# Patient Record
Sex: Male | Born: 1950 | Race: White | Hispanic: No | Marital: Married | Smoking: Former smoker
Health system: Southern US, Community
[De-identification: ages and names within clinical notes are randomized; demographics above are authoritative.]

## PROBLEM LIST (undated history)

## (undated) ENCOUNTER — Emergency Department (HOSPITAL_COMMUNITY): Admission: EM | Discharge status: Against Medical Advice | Payer: BC Managed Care – PPO

## (undated) DIAGNOSIS — I2510 Atherosclerotic heart disease of native coronary artery without angina pectoris: Secondary | ICD-10-CM

## (undated) DIAGNOSIS — Z8619 Personal history of other infectious and parasitic diseases: Secondary | ICD-10-CM

## (undated) DIAGNOSIS — K219 Gastro-esophageal reflux disease without esophagitis: Secondary | ICD-10-CM

## (undated) DIAGNOSIS — M542 Cervicalgia: Secondary | ICD-10-CM

## (undated) DIAGNOSIS — R251 Tremor, unspecified: Secondary | ICD-10-CM

## (undated) DIAGNOSIS — M1990 Unspecified osteoarthritis, unspecified site: Secondary | ICD-10-CM

## (undated) DIAGNOSIS — G5600 Carpal tunnel syndrome, unspecified upper limb: Secondary | ICD-10-CM

## (undated) DIAGNOSIS — H04123 Dry eye syndrome of bilateral lacrimal glands: Secondary | ICD-10-CM

## (undated) DIAGNOSIS — E785 Hyperlipidemia, unspecified: Secondary | ICD-10-CM

## (undated) DIAGNOSIS — R351 Nocturia: Secondary | ICD-10-CM

## (undated) HISTORY — DX: Tremor, unspecified: R25.1

## (undated) HISTORY — PX: COLONOSCOPY: SHX174

## (undated) HISTORY — DX: Carpal tunnel syndrome, unspecified upper limb: G56.00

## (undated) HISTORY — DX: Atherosclerotic heart disease of native coronary artery without angina pectoris: I25.10

## (undated) HISTORY — PX: OTHER SURGICAL HISTORY: SHX169

---

## 1996-09-03 HISTORY — PX: OTHER SURGICAL HISTORY: SHX169

## 2002-10-12 ENCOUNTER — Encounter (INDEPENDENT_AMBULATORY_CARE_PROVIDER_SITE_OTHER): Payer: Self-pay

## 2002-10-12 ENCOUNTER — Encounter: Payer: Self-pay | Admitting: General Surgery

## 2002-10-12 ENCOUNTER — Ambulatory Visit (HOSPITAL_COMMUNITY): Admission: RE | Admit: 2002-10-12 | Discharge: 2002-10-12 | Payer: Self-pay | Admitting: General Surgery

## 2004-06-12 ENCOUNTER — Ambulatory Visit (HOSPITAL_COMMUNITY): Admission: RE | Admit: 2004-06-12 | Discharge: 2004-06-12 | Payer: Self-pay | Admitting: Internal Medicine

## 2006-09-03 HISTORY — PX: OTHER SURGICAL HISTORY: SHX169

## 2009-04-21 ENCOUNTER — Ambulatory Visit (HOSPITAL_COMMUNITY): Admission: RE | Admit: 2009-04-21 | Discharge: 2009-04-21 | Payer: Self-pay | Admitting: Pulmonary Disease

## 2009-05-10 ENCOUNTER — Ambulatory Visit (HOSPITAL_COMMUNITY): Admission: RE | Admit: 2009-05-10 | Discharge: 2009-05-10 | Payer: Self-pay | Admitting: Pulmonary Disease

## 2009-06-02 ENCOUNTER — Encounter (INDEPENDENT_AMBULATORY_CARE_PROVIDER_SITE_OTHER): Payer: Self-pay | Admitting: *Deleted

## 2009-06-02 ENCOUNTER — Ambulatory Visit: Payer: Self-pay | Admitting: Cardiology

## 2009-06-02 DIAGNOSIS — Z8719 Personal history of other diseases of the digestive system: Secondary | ICD-10-CM | POA: Insufficient documentation

## 2009-06-02 DIAGNOSIS — G252 Other specified forms of tremor: Secondary | ICD-10-CM

## 2009-06-02 DIAGNOSIS — G250 Essential tremor: Secondary | ICD-10-CM | POA: Insufficient documentation

## 2009-06-02 DIAGNOSIS — F17200 Nicotine dependence, unspecified, uncomplicated: Secondary | ICD-10-CM | POA: Insufficient documentation

## 2009-06-02 DIAGNOSIS — M79609 Pain in unspecified limb: Secondary | ICD-10-CM | POA: Insufficient documentation

## 2009-06-15 ENCOUNTER — Encounter (INDEPENDENT_AMBULATORY_CARE_PROVIDER_SITE_OTHER): Payer: Self-pay | Admitting: *Deleted

## 2009-06-17 ENCOUNTER — Ambulatory Visit: Payer: Self-pay | Admitting: Cardiology

## 2009-06-17 ENCOUNTER — Encounter (INDEPENDENT_AMBULATORY_CARE_PROVIDER_SITE_OTHER): Payer: Self-pay | Admitting: *Deleted

## 2009-06-22 ENCOUNTER — Ambulatory Visit: Payer: Self-pay | Admitting: Cardiology

## 2009-06-22 ENCOUNTER — Encounter (HOSPITAL_COMMUNITY): Admission: RE | Admit: 2009-06-22 | Discharge: 2009-07-22 | Payer: Self-pay | Admitting: Cardiology

## 2009-06-24 ENCOUNTER — Telehealth (INDEPENDENT_AMBULATORY_CARE_PROVIDER_SITE_OTHER): Payer: Self-pay

## 2009-06-27 DIAGNOSIS — I10 Essential (primary) hypertension: Secondary | ICD-10-CM | POA: Insufficient documentation

## 2009-06-28 ENCOUNTER — Encounter: Payer: Self-pay | Admitting: Cardiology

## 2009-07-07 ENCOUNTER — Ambulatory Visit: Payer: Self-pay | Admitting: Cardiology

## 2009-07-07 DIAGNOSIS — I517 Cardiomegaly: Secondary | ICD-10-CM | POA: Insufficient documentation

## 2009-07-08 ENCOUNTER — Ambulatory Visit: Payer: Self-pay | Admitting: Cardiology

## 2009-07-08 ENCOUNTER — Encounter: Payer: Self-pay | Admitting: Cardiology

## 2010-07-06 ENCOUNTER — Encounter: Payer: Self-pay | Admitting: Internal Medicine

## 2010-07-17 ENCOUNTER — Ambulatory Visit (HOSPITAL_COMMUNITY): Admission: RE | Admit: 2010-07-17 | Discharge: 2010-07-17 | Payer: Self-pay | Admitting: Internal Medicine

## 2010-07-17 ENCOUNTER — Ambulatory Visit: Payer: Self-pay | Admitting: Internal Medicine

## 2010-08-11 ENCOUNTER — Encounter (INDEPENDENT_AMBULATORY_CARE_PROVIDER_SITE_OTHER): Payer: Self-pay | Admitting: *Deleted

## 2010-09-24 ENCOUNTER — Encounter: Payer: Self-pay | Admitting: Cardiology

## 2010-10-03 NOTE — Assessment & Plan Note (Signed)
 Summary: ROV   Visit Type:  Follow-up Primary Provider:  Dr.Edward Juanetta Gosling   History of Present Illness: Return visit for this 60 year old gentleman currently under evaluation for left arm discomfort.  A magnetic resonance imaging study reportedly revealed the presence of degenerative joint disease, which likely accounts for his symptoms.  He appreciated no benefit from naproxen and has discontinued that medication.  Pedal edema resolved after he did so.  He continues to be quite active with no cardiopulmonary symptoms.  He has tapered tobacco use, now smoking one bowl in his pipe per day rather than 7.  He was congratulated on this effort and advised to discontinue use of tobacco products entirely.   Current Medications (verified): 1)  Propranolol Hcl 10 Mg Tabs (Propranolol Hcl) .... Take Two Tabs Once Daily 2)  Glucosamine 500 Mg Caps (Glucosamine Sulfate) .... Take 1 Tab Daily 3)  Vitamin D 1000 Unit Caps (Cholecalciferol) .... Take 1 Tab Daily 4)  Vitamin C Cr 500 Mg Cr-Caps (Ascorbic Acid) .... Take 1 Tab Daily 5)  Vitamin E 400 Unit Caps (Vitamin E) .... Take 1 Cap Daily 6)  Vitamin B-12 500 Mcg Tabs (Cyanocobalamin) .... Take 1 Tab Daily  Allergies (verified): No Known Drug Allergies  Vital Signs:  Patient profile:   60 year old male Weight:      206 pounds Pulse rate:   44 / minute BP sitting:   140 / 80  (right arm)  Vitals Entered By: Dreama Saa, CNA (July 07, 2009 3:18 PM)  Physical Exam  General:   General-Well-developed; no acute distress: Lungs-No tachypnea, no rales,minimal expiratory rhonchi;no prolongation of expiratory phase CV-normal PMI; normal S1 and S2; grade 1--2/6 early systolic ejection murmur at the cardiac base; S4 present  Abdomen-BS normal; soft and non-tender without masses or organomegaly: Extremities-Nl distal pulses; no edema    Impression & Recommendations:  Problem # 1:  ARM PAIN, LEFT (ICD-729.5) I continue to doubt the  presence of any significant cardiac problems.  Unfortunately, each test performed leads to get another study.  His standard treadmill test was equivocal although exercise tolerance was excellent, and symptoms were not reproduced.  His stress nuclear study was interpreted as potentially showing ischemic heart disease; however, to my review, there is diaphragmatic attenuation, physiologic apical thinning and no definite perfusion abnormality.  There was, however, moderate left ventricular enlargement and mildly depressed left ventricular systolic function.  We will proceed with an echocardiogram to further investigate these findings, which hopefully represent artifact. His cardiac enlargement and sinus bradycardia could be explained by the extent of aerobic exercise and he performs; however, this would not depress ejection fraction.  I will not plan a return visit since I believe his echocardiogram will be normal or near normal.   Addendum:  Echocardiogram reviewed.  The left ventricle is dialated, but only mildly so.  LV systolic function is low normal.  No valvular abnormalities were noted.  I discussed these results with Mr. Gosline.  While this certainly could be the result of extensive aerobic conditioning over many years, there is no guarantee that this is the case.  I will continue to see him annually for the time being to verify the no cardiac pathology is present.     I will be happy to assist them with any future cardiology issues that arise.  Other Orders: 2-D Echocardiogram (2D Echo)  Patient Instructions: 1)  Your physician recommends that you schedule a follow-up appointment in: as needed 2)  Your  physician has requested that you have an echocardiogram.  Echocardiography is a painless test that uses sound waves to create images of your heart. It provides your doctor with information about the size and shape of your heart and how well your heart's chambers and valves are working.  This  procedure takes approximately one hour. There are no restrictions for this procedure.

## 2010-10-03 NOTE — Letter (Signed)
 Summary: Recall, Screening Colonoscopy Only  [facility]  [address]   [place], [place] [address]   Phone: [phone]  Fax: [phone]    June 15, 2009  Jeffery Goodwin [address] [place], [place]  [address] 1951-08-19   Dear [name],   Our records indicate it is time to schedule your colonoscopy.  However, we need you  to contact us by phone.   Please call our office at [phone] and ask for the nurse.   Thank you,    Hendricks Limes, LPN Cloria Spring, LPN  [facility] Ph: [phone]   Fax: [phone]

## 2010-10-03 NOTE — Miscellaneous (Signed)
 Summary: Echo  Clinical Lists Changes  Observations: Added new observation of ECHOINTERP: Study Conclusions    1. Stress ECG conclusions: There were no stress arrhythmias or      conduction abnormalities; tracing quality was suboptimal due to      artifact. The stress ECG revealed general insignificant uploping      ST segment depression, most notable in the first minute of      recovery and was negative for ischemia.   2. Staged echo: There was no echocardiographic evidence for      stress-induced ischemia.   Bruce protocol. Stress echocardiography. Height: Height: 152.4cm.   Height: 60in. Weight: Weight: 48.6kg. Weight: 107lb. Body mass   index: BMI: 20.9kg/m^2. Body surface area: BSA: 1.65m^2. Patient   status: Outpatient.  (07/08/2009 13:36)      Echocardiogram  Procedure date:  07/08/2009  Findings:      Study Conclusions    1. Stress ECG conclusions: There were no stress arrhythmias or      conduction abnormalities; tracing quality was suboptimal due to      artifact. The stress ECG revealed general insignificant uploping      ST segment depression, most notable in the first minute of      recovery and was negative for ischemia.   2. Staged echo: There was no echocardiographic evidence for      stress-induced ischemia.   Bruce protocol. Stress echocardiography. Height: Height: 152.4cm.   Height: 60in. Weight: Weight: 48.6kg. Weight: 107lb. Body mass   index: BMI: 20.9kg/m^2. Body surface area: BSA: 1.30m^2. Patient   status: Outpatient.

## 2010-10-03 NOTE — Assessment & Plan Note (Signed)
 Summary: per Dr.Hawkins to evaluate for stress test/tg   Visit Type:  Initial Consult Primary Provider:  Dr.Edward Juanetta Goodwin   History of Present Illness: Jeffery Goodwin has enjoyed generally excellent health and is referred now for stress testing to exclude coronary artery disease.  In recent weeks, he has noted mild to moderate discomfort in his left arm.  This is related to the use of the upper extremity, but not necessarily to all exertion.  He has had a radiographic studies suggest the presence of significant degenerative disc disease of the cervical spine.  He has had no chest discomfort.  There's been no dyspnea, diaphoresis nor nausea.  Exercise tolerance is fairly good.  CBC and complete metabolic profile results available from 06/2006.  These studies were entirely normal.  Current Medications (verified): 1)  Propranolol Hcl 10 Mg Tabs (Propranolol Hcl) .... Take 1 Tab Two Times A Day 2)  Glucosamine 500 Mg Caps (Glucosamine Sulfate) .... Take 1 Tab Daily 3)  Vitamin D 1000 Unit Caps (Cholecalciferol) .... Take 1 Tab Daily 4)  Vitamin C Cr 500 Mg Cr-Caps (Ascorbic Acid) .... Take 1 Tab Daily 5)  Vitamin E 400 Unit Caps (Vitamin E) .... Take 1 Cap Daily 6)  Vitamin B-12 500 Mcg Tabs (Cyanocobalamin) .... Take 1 Tab Daily 7)  Aleve 220 Mg Tabs (Naproxen Sodium) .... Take 1 Tab Daily  Allergies (verified): No Known Drug Allergies  Past History:  Past Medical History: Tremor-right hand Hematochezia--2005  Past Surgical History: Arthroscopic left Knee surgery Left inguinal herniorrhaphy Carpal tunnel repair  Family History: Mother--carcinoma of the colon Father died age 65 due to brain neoplasm Siblings-2 brothers are alive and well.  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  All other systems reviewed and are negative.  Vital Signs:  Patient profile:   60 year old male Height:      71 inches Weight:      200 pounds BMI:     28.00 Pulse rate:   50 / minute BP sitting:   131 / 77  (right arm)  Vitals Entered By: Dreama Saa, CNA (June 02, 2009 1:35 PM)  Physical Exam  General:   General-Well-developed; no acute distress: HEENT-[place]/AT; PERRL; EOM intact; conjunctiva and lids nl:  Neck-No JVD; no carotid bruits: Endocrine-No thyromegaly: Lungs-No tachypnea, no rales, minor expiratory rhonchi; prolonged expiratory phase CV-normal PMI; normal S1 and S2; grade 1--2/6 early systolic ejection murmur at the cardiac base;  Abdomen-BS normal; soft and non-tender without masses or organomegaly: MS-No deformities, cyanosis or clubbing: Neurologic-Nl cranial nerves; symmetric strength and tone: Skin- Warm, no sig. lesions: Extremities-Nl distal pulses; no edema    Impression & Recommendations:  Problem # 1:  ARM PAIN, LEFT (ICD-729.5) Symptoms are likely related to the patient's cervical spine disease.  A standard treadmill exercise test will be undertaken to rule out significant ischemic heart disease and to assess exercise tolerance.  A lipid profile is available from 06/2006 with fairly good results: Total cholesterol was 190, triglycerides 58, HDL 64 and LDL 114.  I doubt that pharmacologic therapy is necessary. He has noted minimal ankle edema since starting naproxen.  This is probably drug related and not of concern.  If stress test is negative, anticipate no  further cardiology testing nor followup.  I greatly appreciate Dr. Juanetta Goodwin sending this very nice gentleman to see me.  Other Orders: Treadmill (Treadmill)  EKG  Procedure date:  06/02/2009  Findings:      Sinus bradycardia at a rate of 45. Left atrial enlargement. Right ventricular  conduction delay. Delayed R-wave progression. Nondiagnostic lateral Q waves. No prior tracing for comparison.   Patient Instructions: 1)  Your physician recommends that you schedule a follow-up appointment in:  as needed 2)  Your physician has requested that you have an exercise tolerance test.  For further information please visit [email or web address]  Please also follow instruction sheet, as given. 3)  Your physician discussed the hazards of tobacco use.  Tobacco use cessation is recommended and techniques and options to help you quit were discussed.

## 2010-10-03 NOTE — Progress Notes (Signed)
**Note De-Identified [place] Ackert Obfuscation** Summary: stress test results  Phone Note Call from Patient   Caller: stress myoviewPatient Reason for Call: Lab or Test Results Summary of Call: Patient was given results of stress test. He states that after stress test he was told his BP was 208/80 and wants to know if it is alright for him to exercise as he normally does? Please advise. Initial call taken by: Larita Fife Abrie Egloff LPN,  June 24, 2009 2:54 PM  Follow-up for Phone Call        Please ask him to collect home or pharmacy BPs during the next 2 weeks and schedule a return visit with me.  He can exercise until then but not close to the intensity of his stress test.  The BP response in not a worrisome problem, but the abnormal stress test is of some concern.  Radcliffe Bing, M.D.  Follow-up by: Kathlen Brunswick, MD, [facility],  June 27, 2009 8:41 AM  New Problems: HYPERTENSION (ICD-401.9)   New Problems: HYPERTENSION (ICD-401.9)

## 2010-10-03 NOTE — Miscellaneous (Signed)
 Summary: treadmill 06/17/2009  Clinical Lists Changes  Observations: Added new observation of ETTFINDING:  IMPRESSION:   1. Borderline electrocardiographic response at a very high workload.   2. Borderline hypertensive blood pressure response.   3. Excellent exercise tolerance.   4. The patient's presenting symptom of left arm discomfort was not       reproduced by treadmill exercise.            Gerrit Friends. Dietrich Pates, MD, [facility]   Electronically Signed  (06/17/2009 8:47)      Exercise Stress Test  Procedure date:  06/17/2009  Findings:       IMPRESSION:   1. Borderline electrocardiographic response at a very high workload.   2. Borderline hypertensive blood pressure response.   3. Excellent exercise tolerance.   4. The patient's presenting symptom of left arm discomfort was not       reproduced by treadmill exercise.            Gerrit Friends. Dietrich Pates, MD, [facility]   Electronically Signed

## 2010-10-03 NOTE — Letter (Signed)
Summary: Appointment - Reminder 2  [facility]. [address]   [place], [place] [address]   Phone: [phone]  Fax: [phone]     August 11, 2010 MRN: [id number]   Jeffery Goodwin [address] [place], [place]  [address]   Dear [name],  Our records indicate that it is time to schedule a follow-up appointment.  Dr.   Dietrich Pates       recommended that you follow up with [place] in   07/2010 PAST DUE         . It is very important that we reach you to schedule this appointment. We look forward to participating in your health care needs. Please contact us at the number listed above at your earliest convenience to schedule your appointment.  If you are unable to make an appointment at this time, give [place] a call so we can update our records.     Sincerely,   Glass blower/designer

## 2010-10-03 NOTE — Letter (Signed)
Summary: [facility]CS ORDER/TRIAGE  TCS ORDER/TRIAGE   Imported By: Rexene Alberts 07/06/2010 15:27:04  _____________________________________________________________________  External Attachment:    Type:   Image     Comment:   External Document

## 2010-10-03 NOTE — Assessment & Plan Note (Signed)
 Summary: IN OFFICE TREADMILL/TG   Exercise Tolerance Test Cardiovascular Risk History:      Positive major cardiovascular risk factors include male age over 62 years old.    Exercise Tolerance Test Results:    MPHR (bpm):        160    85% MPHR (bpm):     136  Cardiovascular Risk Assessment/Plan:      The patient's hypertensive risk group is category B: At least one risk factor (excluding diabetes) with no target organ damage.

## 2010-10-03 NOTE — Miscellaneous (Signed)
 Summary: Gxt  Clinical Lists Changes  Observations: Added new observation of ETTFINDING: IMPRESSION: GXT  1. Borderline electrocardiographic response at a very high workload.   2. Borderline hypertensive blood pressure response.   3. Excellent exercise tolerance.   4. The patient's presenting symptom of left arm discomfort was not       reproduced by treadmill exercise.            Gerrit Friends. Dietrich Pates, MD, [facility]   Electronically Signed  (06/17/2009 13:39)      Exercise Stress Test  Procedure date:  06/17/2009  Findings:      IMPRESSION: GXT  1. Borderline electrocardiographic response at a very high workload.   2. Borderline hypertensive blood pressure response.   3. Excellent exercise tolerance.   4. The patient's presenting symptom of left arm discomfort was not       reproduced by treadmill exercise.            Gerrit Friends. Dietrich Pates, MD, [facility]   Electronically Signed

## 2010-10-03 NOTE — Letter (Signed)
 Summary: Dr.Hawkins office note and ekg  Dr.Hawkins office note and ekg   Imported By: Dreama Saa, CNA 06/02/2009 16:18:00  _____________________________________________________________________  External Attachment:    Type:   Image     Comment:   External Document

## 2010-10-03 NOTE — Letter (Signed)
 Summary: [name] (Nuc Med Stress)  [facility]  [address], [place] [address]   Phone: [phone]  Fax: [phone]    Nuclear Medicine 1-Day Stress Test Information Sheet  Re:     Jeffery Goodwin   DOB:     01/25/1951 MRN:     [id number] Weight:  Appointment Date: Register at: Appointment Time: Referring MD:  _X__Exercise Stress  __Adenosine   __Dobutamine  __Lexiscan  __Persantine   __Thallium  Urgency: ____1 (next day)   ____2 (one week)    ____3 (PRN)  Patient will receive Follow Up call with results: Patient needs follow-up appointment:  Instructions regarding medication:  How to prepare for your stress test: 1. DO NOT eat or dring 6 hours prior to your arrival time. This includes no caffeine (coffee, tea, sodas, chocolate) if you were instructed to take your medications, drink water with it. 2. DO NOT use any tobacco products for at leaset 8 hours prior to arrival. 3. DO NOT wear dresses or any clothing that may have metal clasps or buttons. 4. Wear short sleeve shirts, loose clothing, and comfortalbe walking shoes. 5. DO NOT use lotions, oils or powder on your chest before the test. 6. The test will take approximately 3-4 hours from the time you arrive until completion. 7. To register the day of the test, go to the Short Stay entrance at [facility]. 8. If you must cancel your test, call [phone] as soon as you are aware.  After you arrive for test:   When you arrive at [facility], you will go to Short Stay to be registered. They will then send you to Radiology to check in. The Nuclear Medicine Tech will get you and start an IV in your arm or hand. A small amount of a radioactive tracer will then be injected into your IV. This tracer will then have to circulate for 30-45 minutes. During this time you will wait in the waiting room and you will be able to drink something without caffeine. A series of pictures will be taken of  your heart follwoing this waiting period. After the 1st set of pictures you will go to the stress lab to get ready for your stress test. During the stress test, another small amount of a radioactive tracer will be injected through your IV. When the stress test is complete, there is a short rest period while your heart rate and blood pressure will be monitored. When this monitoring period is complete you will have another set of pictrues taken. (The same as the 1st set of pictures). These pictures are taken between 15 minutes and 1 hour after the stress test. The time depends on the type of stress test you had. Your doctor will inform you of your test results within 7 days after test.    The possibilities of certain changes are possible during the test. They include abnormal blood pressure and disorders of the heart. Side effects of persantine or adenosine can include flushing, chest pain, shortness of breath, stomach tightness, headache and light-headedness. These side effects usually do not last long and are self-resolving. Every effort will be made to keep you comfortable and to minimize complications by obtaining a medical history and by close observation during the test. Emergency equipment, medications, and trained personnel are available to deal with any unusual situation which may arise.  Please notify office at least 48 hours in advance if you are unable to keep  this appt.

## 2011-01-16 NOTE — Procedures (Signed)
[facility]                              EXERCISE TREADMILL   FINNEUS, KANESHIRO                        MRN:          [id number]  DATE:06/17/2009                            DOB:          11-09-1950    REFERRING PHYSICIAN:  Dr. Juanetta Gosling   CLINICAL DATA:  60 year old gentleman with left arm discomfort.  1. Treadmill exercise was performed to a workload of 13.7 METS and a      heart rate of 160, 100% of age-predicted maximum.  Exercise was      discontinued due to dyspnea and fatigue.  2. The blood pressure increased from a resting value of 130/70 to      200/70 early in recovery, a borderline hypertensive response.  3. No arrhythmias noted.  4. Resting EKG:  Sinus bradycardia at a rate of 42; otherwise within      normal limits.   STRESS EKG:  Near peak exercise, substantial upsloping ST-segment  depression was noted in the inferior leads.  This appeared to reach no  more than 2 mm on the actual EKG tracings, but the computer averages  suggested more than 3 mm in leads II, III and aVF.  ST segments returned  to normal within the first minute of recovery; however, late in  recovery, there was 1 mm of flat and slightly downsloping ST-segment  depression, also in the inferior leads as well as V5-V6.  This resolved  by 10 minutes into recovery.   IMPRESSION:  1. Borderline electrocardiographic response at a very high workload.  2. Borderline hypertensive blood pressure response.  3. Excellent exercise tolerance.  4. The patient's presenting symptom of left arm discomfort was not      reproduced by treadmill exercise.     Gerrit Friends. Dietrich Pates, MD, [facility]  Electronically Signed    RMR/MedQ  DD: 06/24/2009  DT: 06/25/2009  Job #: [id number]

## 2011-01-19 NOTE — Op Note (Signed)
NAME:  Jeffery Goodwin, Jeffery Goodwin                      ACCOUNT NO.:  [id number]   MEDICAL RECORD NO.:  [id number]                   PATIENT TYPE:  AMB   LOCATION:  DAY                                  FACILITY:  [facility]   PHYSICIAN:  Timothy E. Earlene Plater, M.D.              DATE OF BIRTH:  [birth year]   DATE OF PROCEDURE:  10/12/2002  DATE OF DISCHARGE:                                 OPERATIVE REPORT   PREOPERATIVE DIAGNOSIS:  Left inguinal hernia.   POSTOPERATIVE DIAGNOSIS:  Left sliding indirect inguinal hernia.   OPERATIVE PROCEDURE:  Repair of hernia with mesh.   SURGEON:  Timothy E. Earlene Plater, M.D.   ANESTHESIA:  General.   DESCRIPTION OF PROCEDURE:  [name] is otherwise healthy.  No medical  history.  Developed a new left inguinal hernia very recently and wishes to  have a repair.  This has been carefully explained.  He was identified, the  proper marked, and the permit signed.  Evaluated by anesthesia.   He was taken to the operating room, placed supine, general endotracheal  anesthesia administered.  The left groin had been shaved.  It was prepped  and draped in the usual fashion.  Marcaine 0.5% with epinephrine was used  throughout for a wide field block anesthesia.  Curvilinear incision made  over the palpable defect, the abundant subcutaneous tissue dissected,  bleeding points carefully cauterized.  The wound was dry.  External oblique  opened in line with the fibers to the external ring, care taken to avoid the  ilioinguinal nerve.  The cord structures were dissected from the floor of  the canal.  Floor was actually pretty well intact.  The hernia was in the  internal ring, and it was dissected from the cord structures.  A sac was  dissected out, carefully opened, and the sigmoid colon was adherent to the  sac.  This was carefully sharply dissected, and the colon fell back into the  abdomen.  The sac was closed with a 0 Prolene suture, excess sac cut away,  neck retracted through  the internal ring.  A medium Davol PreFix plug was  placed in the defect and sewn to the surrounding edges and muscle with 0  Prolene, and then the patch was placed over the floor of the canal to around  the internal ring and sewn down with running 2-0 Prolene.  Again, care taken  to avoid the ilioinguinal nerve.  Cord structures exited normally.  The  floor was intact.  The procedure was complete.  The external oblique was  closed with a running 2-0 Vicryl, the deep subcu 3-0 Monocryl, skin 3-0  Monocryl, Steri-Strips applied.  Counts correct.  He tolerated it well.  Dry  sterile dressing applied over Steri-Strips, and he was removed to the  recovery room in good condition.   Written and verbal instructions were given him and his wife, including  Percocet #36, and he will  be followed as an outpatient.                                               Timothy E. Earlene Plater, M.D.    TED/MEDQ  D:  10/12/2002  T:  10/12/2002  Job:  [id number]

## 2011-01-19 NOTE — Consult Note (Signed)
NAME:  Jeffery Goodwin, Jeffery Goodwin                           ACCOUNT NO.:  [id number]   MEDICAL RECORD NO.:  [id number]                   PATIENT TYPE:   LOCATION:                                       FACILITY:   PHYSICIAN:  R. Roetta Sessions, M.D.              DATE OF BIRTH:  [birth year]   DATE OF PROCEDURE:  05/22/2004  DATE OF DISCHARGE:                                   Consultation   PRIMARY CARE PHYSICIAN:  Oneal Deputy. Juanetta Gosling, M.D.   REASON FOR CONSULTATION:  Hematochezia.   HISTORY OF PRESENT ILLNESS:  Jeffery Goodwin is a pleasant 60 year old  gentleman sent over at the courtesy of Dr. Shaune Pollack to further evaluate  intermittent low volume paper hematochezia. Jeffery Goodwin has noted small amounts  of blood from time to time when he has a bowel movement. He denies  constipation or diarrhea. No associated abdominal pain. Family history is  significant in that his mother underwent a resection for colorectal cancer  when she was 65. He has never had a colonoscopy.   PAST MEDICAL HISTORY:  Right hand tremor for which she takes Inderal.   PAST SURGICAL HISTORY:  1.  Knee surgery.  2.  Left inguinal hernia.  3.  Carpal tunnel repair.   CURRENT MEDICATIONS:  1.  Propranolol 10 mg daily.  2.  Ibuprofen p.r.n.  3.  Vitamin C and E.  4.  Fish oil.  5.  St. Desmon's aspirin 81 mg.  6.  Vitamin B12.  7.  Saw palmetto daily.   ALLERGIES:  No known drug allergies.   FAMILY HISTORY:  Significant for colorectal cancer in his mother.   SOCIAL HISTORY:  The patient is married. He is self-employed with [employer]. He smokes about a 1/4 pack of cigarettes daily. He has about a 6  pack of beer on the weekends.   REVIEW OF SYSTEMS:  No chest pain. No dyspnea on exertion. He has lost 30  pounds but was trying to do so because his cholesterol was elevated. No  upper GI tract symptoms such as odynophagia, dysphagia, early satiety,  reflux symptoms, nausea, or vomiting.   PHYSICAL EXAMINATION:   GENERAL:  Reveals a pleasant 60 year old gentleman  resting comfortably.  VITAL SIGNS:  Weight 211, height 6 foot 1, BP 120/78, pulse 60.  SKIN:  Warm and dry. No jaundice __________ stigmata for chronic liver  disease.  HEENT:  No scleral icterus. JVD is not prominent.  CHEST:  Lungs are clear to auscultation.  CARDIAC:  Regular rate and rhythm without murmurs, gallops, or rubs.  ABDOMEN:  Nondistended, positive bowel sounds, soft, nontender without  appreciable masses or organomegaly.  EXTREMITIES:  No edema.  RECTAL:  Has two small hemorrhoid tags. Digital exam reveals prostate not  enlarged. No rectal masses. Scant brown stool. Hemoccult negative.   IMPRESSION:  Jeffery Goodwin is a 61 year old gentleman with  intermittent  paper hematochezia. He has never had a colonoscopy. He has a positive family  history of colorectal neoplasia. He most likely has a benign anorectal  lesion to account for his hematochezia. This is only conjecture at this  time. He needs to have his entire lower GI tract evaluated. To this end, I  will offer Jeffery Goodwin a colonoscopy. Potential risks, benefits, and  alternatives has been reviewed and questions answered. He is agreeable. Plan  will be for colonoscopy in the very near future with further recommendations  to follow.   I would like to thank Dr. Shaune Pollack for his continued confidence in me.                                                Jonathon Bellows, M.D.    RMR/MEDQ  D:  05/22/2004  T:  05/22/2004  Job:  [id number]

## 2011-01-19 NOTE — Op Note (Signed)
NAME:  LILLARD, BAILON                 ACCOUNT NO.:  [id number]   MEDICAL RECORD NO.:  [id number]          PATIENT TYPE:  AMB   LOCATION:  DAY                           FACILITY:  [facility]   PHYSICIAN:  R. Roetta Sessions, M.D. DATE OF BIRTH:  01/19/1951   DATE OF PROCEDURE:  06/12/2004  DATE OF DISCHARGE:                                 OPERATIVE REPORT   PROCEDURE:  Diagnostic colonoscopy.   INDICATION FOR PROCEDURE:  The patient is a 60 year old gentleman who comes  to have colonoscopy.  He has had intermittent paper hematochezia, otherwise  devoid of any GI tract symptoms.  Family history positive for colon cancer  in his mother.  He has never had a colonoscopy.  Colonoscopy is now being  done.  The approach has been discussed with the patient at length.  The  potential risks, benefits, and alternatives have been reviewed, questions  answered.   PROCEDURE NOTE:  O2 saturation, blood pressure, pulse, and respirations were  monitored throughout the entire procedure.   CONSCIOUS SEDATION:  Versed 4 mg IV, Demerol 75 mg IV in divided doses.   INSTRUMENT USED:  Olympus video chip system.   FINDINGS:  Digital rectal examination revealed no abnormalities.   ENDOSCOPIC FINDINGS:  Prep was adequate.   Rectum:  Examination of the rectal mucosa including a retroflexed view of  the anal verge and en face view of the anal canal demonstrated only some  minimal anal canal hemorrhoids.   Colon:  The colonic mucosa was surveyed from the rectosigmoid junction  through the left, transverse, and right colon to the area of the appendiceal  orifice and the ileocecal valve and cecum. These structures were well-seen  and photographed for the record.  From this level the scope was slowly  withdrawn.  All previously-mentioned mucosal surfaces were again seen.  The  colonic mucosa appeared normal.  The patient tolerated the procedure well  and was reacted in endoscopy.   IMPRESSION:  1.  Minimal anal  canal hemorrhoids, otherwise normal rectum.  2.  Normal colon.   RECOMMENDATIONS:  1.  Hemorrhoid literature provided to Mr. Sprung.  2.  A 10-day course of Anusol-HC suppositories one per rectum at bedtime.  3.  The patient is to let me know if he has continued rectal bleeding;      otherwise, he needs a high-risk screening colonoscopy in five years.     Otelia Sergeant   RMR/MEDQ  D:  06/12/2004  T:  06/12/2004  Job:  [id number]   cc:   Oneal Deputy. Juanetta Gosling, M.D.  [address]  [place]  [place] [address]  Fax: [phone]

## 2011-03-12 ENCOUNTER — Encounter: Payer: Self-pay | Admitting: Cardiology

## 2012-12-11 ENCOUNTER — Other Ambulatory Visit (HOSPITAL_COMMUNITY): Payer: Self-pay | Admitting: Pulmonary Disease

## 2012-12-11 ENCOUNTER — Ambulatory Visit (HOSPITAL_COMMUNITY)
Admission: RE | Admit: 2012-12-11 | Discharge: 2012-12-11 | Disposition: A | Discharge status: Home / Self Care | Payer: BC Managed Care – PPO | Source: Ambulatory Visit | Attending: Pulmonary Disease | Admitting: Pulmonary Disease

## 2012-12-11 ENCOUNTER — Other Ambulatory Visit (HOSPITAL_COMMUNITY): Payer: BC Managed Care – PPO

## 2012-12-11 ENCOUNTER — Ambulatory Visit (HOSPITAL_COMMUNITY): Payer: BC Managed Care – PPO

## 2012-12-11 DIAGNOSIS — M25531 Pain in right wrist: Secondary | ICD-10-CM

## 2012-12-11 DIAGNOSIS — M25549 Pain in joints of unspecified hand: Secondary | ICD-10-CM | POA: Insufficient documentation

## 2012-12-11 DIAGNOSIS — M25539 Pain in unspecified wrist: Secondary | ICD-10-CM | POA: Insufficient documentation

## 2012-12-11 DIAGNOSIS — M25439 Effusion, unspecified wrist: Secondary | ICD-10-CM | POA: Insufficient documentation

## 2012-12-11 DIAGNOSIS — X58XXXA Exposure to other specified factors, initial encounter: Secondary | ICD-10-CM | POA: Insufficient documentation

## 2012-12-11 DIAGNOSIS — S63519A Sprain of carpal joint of unspecified wrist, initial encounter: Secondary | ICD-10-CM | POA: Insufficient documentation

## 2013-11-23 ENCOUNTER — Ambulatory Visit (INDEPENDENT_AMBULATORY_CARE_PROVIDER_SITE_OTHER): Payer: BC Managed Care – PPO | Admitting: Cardiology

## 2013-11-23 ENCOUNTER — Encounter: Payer: Self-pay | Admitting: Cardiology

## 2013-11-23 VITALS — BP 130/78 | HR 49 | Ht 73.0 in | Wt 218.0 lb

## 2013-11-23 DIAGNOSIS — F17200 Nicotine dependence, unspecified, uncomplicated: Secondary | ICD-10-CM

## 2013-11-23 DIAGNOSIS — R9431 Abnormal electrocardiogram [ECG] [EKG]: Secondary | ICD-10-CM

## 2013-11-23 DIAGNOSIS — I2510 Atherosclerotic heart disease of native coronary artery without angina pectoris: Secondary | ICD-10-CM | POA: Insufficient documentation

## 2013-11-23 DIAGNOSIS — R072 Precordial pain: Secondary | ICD-10-CM

## 2013-11-23 MED ORDER — PROPRANOLOL HCL 10 MG PO TABS: 15.0000 mg | ORAL_TABLET | Freq: Every day | ORAL | Status: DC

## 2013-11-23 NOTE — Assessment & Plan Note (Signed)
Recently noted with exertion, also shortness of breath. ECG is abnormal in comparison to tracing from 2010. Symptoms are much more typical this point for angina in comparison to prior evaluation. He has had no other obvious change in his health status however. We reviewed the results of his prior testing, low risk but not completely normal at that time. We discussed options for followup noninvasive versus invasive cardiac testing. At this point he was most comfortable with noninvasive evaluation, and a followup exercise Cardiolite (imaging study needed due to abnormal ECG at baseline and equivocal to abnormal ECG findings on previous GXT) will be obtained to assess ischemic burden and compare with prior results. Certainly, if this shows progressive abnormalities, we will discuss a diagnostic cardiac catheterization.

## 2013-11-23 NOTE — Patient Instructions (Signed)
Your physician recommends that you schedule a follow-up appointment in: to be determined after stress test  We will call you with results  Your physician recommends that you continue on your current medications as directed. Please refer to the Current Medication list given to you today.   Your physician has requested that you have en exercise stress myoview. For further information please visit [email or web address]. Please follow instruction sheet, as given.   Thank you for choosing [facility] !

## 2013-11-23 NOTE — Progress Notes (Signed)
Clinical Summary [name] is a 63 y.o.male referred for cardiology consultation by Dr. Juanetta Goodwin. 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 Stays active, including regular exercise. He was previously a runner until he developed knee problems, more recently has been swimming after work for an hour, riding his bicycle usually on the weekends, typically 20 miles or so. He had taken a few weeks off from riding his bike due to the weather, when he went back he was noticeably more short of breath on the same course, actually developed some tightness in his chest, although did not have to stop. Second time he rode he had similar symptoms although they were not as intense.  Records indicate prior cardiology evaluation by Dr. Dietrich Goodwin in 2010. At that time the patient was having left arm discomfort. He underwent an echocardiogram and a Myoview. Echocardiogram from November 2010 demonstrated mildly dilated left ventricle with mild LVH, LVEF 50-55%, grade 1 diastolic dysfunction, mild mitral regurgitation, mild to moderate left atrial enlargement, mild tricuspid regurgitation. Exercise Myoview showed equivocal ST segment changes at a maximum workload of 13.4 METs, no chest pain, hypertensive response. Perfusion imaging was suggestive of possible mild ischemia in the apical anteroseptal wall as well as diaphragmatic attenuation. LVEF was 45% by that study.  ECG today shows sinus bradycardia at 49 beats per minute with nonspecific ST-T changes including less than a millimeter ST segment depression in the inferolateral leads. These changes are new in comparison to the previous tracing from 2010.  He has no significant history of hyperlipidemia, diabetes mellitus, or hypertension. He was previously a smoker, but quit 5 years ago.  No Known Allergies  Current Outpatient Prescriptions  Medication Sig Dispense Refill  . aspirin 81 MG chewable tablet Chew by mouth daily.      .  meloxicam (MOBIC) 7.5 MG tablet Take 7.5 mg by mouth daily. Takes 1/2 tablet qd      . saw palmetto 500 MG capsule Take 500 mg by mouth daily.      Marland Kitchen. ascorbic Acid (VITAMIN C CR) 500 MG CPCR Take 500 mg by mouth daily.        . Glucosamine 500 MG CAPS Take by mouth daily.        . propranolol (INDERAL) 10 MG tablet Take 1.5 tablets (15 mg total) by mouth daily. Take 1 tab am,1/2 tab pm  45 tablet  3  . vitamin B-12 (CYANOCOBALAMIN) 500 MCG tablet Take 500 mcg by mouth daily.        . vitamin E 400 UNIT capsule Take 400 Units by mouth daily.         No current facility-administered medications for this visit.    Past Medical History  Diagnosis Date  . Tremor     Right hand  . Hematochezia 2005  . Prostatitis   . Carpal tunnel syndrome     Past Surgical History  Procedure Laterality Date  . Arthroscopic left knee surgery    . Left inguinal herniorrhaphy    . Carpal tunnel repair      Family History  Problem Relation Age of Onset  . Colon cancer Mother   . Brain cancer Father     Social History [name] reports that he has been smoking Cigarettes.  He has been smoking about 0.00 packs per day. He does not have any smokeless tobacco history on file. [name] reports that he drinks alcohol.  Review of Systems No palpitations, dizziness, syncope.  Stable appetite. No claudication. Does get leg cramps occasionally. Otherwise negative.  Physical Examination Filed Vitals:   11/23/13 1318  BP: 130/78  Pulse: 49   Filed Weights   11/23/13 1318  Weight: 218 lb (98.884 kg)   The patient is comfortable at rest. HEENT: Conjunctiva and lids normal, oropharynx clear. Neck: Supple, no elevated JVP or carotid bruits, no thyromegaly. Lungs: Clear to auscultation, nonlabored breathing at rest. Cardiac: Regular rate and rhythm, no S3 or significant systolic murmur, no pericardial rub. Abdomen: Soft, nontender, bowel sounds present, no guarding or rebound. Extremities: No pitting  edema, distal pulses 2+. Skin: Warm and dry. Musculoskeletal: No kyphosis. Neuropsychiatric: Alert and oriented x3, affect grossly appropriate.   Problem List and Plan   Precordial pain Recently noted with exertion, also shortness of breath. ECG is abnormal in comparison to tracing from 2010. Symptoms are much more typical this point for angina in comparison to prior evaluation. He has had no other obvious change in his health status however. We reviewed the results of his prior testing, low risk but not completely normal at that time. We discussed options for followup noninvasive versus invasive cardiac testing. At this point he was most comfortable with noninvasive evaluation, and a followup exercise Cardiolite (imaging study needed due to abnormal ECG at baseline and equivocal to abnormal ECG findings on previous GXT) will be obtained to assess ischemic burden and compare with prior results. Certainly, if this shows progressive abnormalities, we will discuss a diagnostic cardiac catheterization.  TOBACCO ABUSE Quit smoking 5 years ago.    Jonelle Sidle, M.D., F.A.C.C.

## 2013-11-23 NOTE — Assessment & Plan Note (Signed)
Quit smoking 5 years ago 

## 2013-11-24 ENCOUNTER — Telehealth: Payer: Self-pay | Admitting: *Deleted

## 2013-11-24 NOTE — Telephone Encounter (Signed)
Left message for pt to call back  °

## 2013-11-24 NOTE — Telephone Encounter (Signed)
Pt is double checking to make sure he can take all his medications before stress test on Thursday and he wanted us to be awre that he is taking fish oil also.

## 2013-11-25 NOTE — Telephone Encounter (Signed)
Spoke with Pt told Mr Jeffery Goodwin to hold his Inderol the night before and morning of procedure. Pt understood.

## 2013-11-26 ENCOUNTER — Encounter (HOSPITAL_COMMUNITY)
Admission: RE | Admit: 2013-11-26 | Discharge: 2013-11-26 | Disposition: A | Discharge status: Home / Self Care | Payer: BC Managed Care – PPO | Source: Ambulatory Visit | Attending: Cardiology | Admitting: Cardiology

## 2013-11-26 ENCOUNTER — Encounter (HOSPITAL_COMMUNITY): Payer: Self-pay

## 2013-11-26 DIAGNOSIS — R072 Precordial pain: Secondary | ICD-10-CM

## 2013-11-26 MED ORDER — TECHNETIUM TC 99M SESTAMIBI GENERIC - CARDIOLITE
30.0000 | Freq: Once | INTRAVENOUS | Status: AC | PRN
  Administered 2013-11-26: 30 via INTRAVENOUS

## 2013-11-26 MED ORDER — REGADENOSON 0.4 MG/5ML IV SOLN
INTRAVENOUS | Status: AC
  Filled 2013-11-26: qty 5

## 2013-11-26 MED ORDER — SODIUM CHLORIDE 0.9 % IJ SOLN
INTRAMUSCULAR | Status: AC
  Administered 2013-11-26: 10 mL via INTRAVENOUS
  Filled 2013-11-26: qty 10

## 2013-11-26 MED ORDER — TECHNETIUM TC 99M SESTAMIBI - CARDIOLITE
10.0000 | Freq: Once | INTRAVENOUS | Status: AC | PRN
  Administered 2013-11-26: 10 via INTRAVENOUS

## 2013-11-26 NOTE — Progress Notes (Signed)
Stress Lab Nurses Notes - Jeffery Goodwin  Erskine SquibbJoseph W Lear 11/26/2013 Reason for doing test: Chest Pain Type of test: Stress Cardiolite Nurse performing test: Parke PoissonPhyllis Billingsly, RN Nuclear Medicine Tech: Lyndel Pleasureyan Liles Echo Tech: Not Applicable MD performing test: Koneswaran/K.Lawrence NP Family MD: Juanetta GoslingHawkins Test explained and consent signed: yes IV started: 22g jelco, Saline lock flushed, No redness or edema and Saline lock started in radiology Symptoms: Chest tightness Treatment/Intervention: None Reason test stopped: fatigue and SOB After recovery IV was: Discontinued via X-ray tech and No redness or edema Patient to return to Nuc. Med at : 10:00 Patient discharged: Home Patient's Condition upon discharge was: stable Comments: During stress test peak BP 180/93 & HR 136.  Recovery BP 121/89 & 51.  Symptoms resolved in recovery. Erskine SpeedBillingsley, Kim Oki T

## 2013-12-07 ENCOUNTER — Telehealth: Payer: Self-pay | Admitting: Cardiology

## 2013-12-07 ENCOUNTER — Encounter: Payer: Self-pay | Admitting: Cardiology

## 2013-12-07 ENCOUNTER — Other Ambulatory Visit: Payer: Self-pay | Admitting: Cardiology

## 2013-12-07 ENCOUNTER — Ambulatory Visit (INDEPENDENT_AMBULATORY_CARE_PROVIDER_SITE_OTHER): Payer: BC Managed Care – PPO | Admitting: Cardiology

## 2013-12-07 VITALS — BP 138/90 | HR 48 | Ht 73.5 in | Wt 225.0 lb

## 2013-12-07 DIAGNOSIS — R072 Precordial pain: Secondary | ICD-10-CM

## 2013-12-07 DIAGNOSIS — R9439 Abnormal result of other cardiovascular function study: Secondary | ICD-10-CM

## 2013-12-07 DIAGNOSIS — F17200 Nicotine dependence, unspecified, uncomplicated: Secondary | ICD-10-CM

## 2013-12-07 NOTE — Telephone Encounter (Signed)
No precert required 

## 2013-12-07 NOTE — Patient Instructions (Signed)
Continue all current medications. Cath - pending - please call office after checking with insurance

## 2013-12-07 NOTE — Progress Notes (Signed)
Clinical Summary Jeffery Goodwin is a 63 y.o.male seen recently in late March for cardiology consultation secondary to chest discomfort and shortness of breath. History is reviewed in the previous note. He was referred for followup ischemic evaluation.  Exercise Cardiolite done on March 23 showed abnormal ST segment changes with exercise, probable scar in the inferior and inferolateral wall, LVEF 46% with inferior hypokinesis. We discussed the results today.  He is still exercising regularly, swimming and doing some cycling as noted previously. He has cut back the distance of his typical bike rides, still doesn't "quite feel right" but has not had chest pain in the last week.  We discussed proceeding to a diagnostic cardiac catheterization to most clearly define his coronary anatomy, and determine if any additional strategies such as revascularization need to be considered. After discussing the potential risks and benefits, he is in agreement to proceed.  No Known Allergies  Current Outpatient Prescriptions  Medication Sig Dispense Refill  . ascorbic Acid (VITAMIN C CR) 500 MG CPCR Take 500 mg by mouth daily.        Marland Kitchen aspirin 81 MG chewable tablet Chew by mouth daily.      . meloxicam (MOBIC) 7.5 MG tablet Take 3.75 mg by mouth daily. Takes 1/2 tablet qd      . Misc Natural Products (GLUCOS-CHONDROIT-MSM COMPLEX PO) Take 1 tablet by mouth daily.      . Omega-3 Krill Oil 1000 MG CAPS Take 1,000 mg by mouth daily.      Marland Kitchen POTASSIUM PO Take 193 mg by mouth daily.      . propranolol (INDERAL) 10 MG tablet Take 1.5 tablets (15 mg total) by mouth daily. Take 1 tab am,1/2 tab pm  45 tablet  3  . saw palmetto 500 MG capsule Take 500 mg by mouth daily.      . SODIUM CHLORIDE PO Take 1 tablet by mouth daily.      . vitamin B-12 (CYANOCOBALAMIN) 500 MCG tablet Take 500 mcg by mouth daily.         No current facility-administered medications for this visit.    Past Medical History  Diagnosis Date  .  Tremor     Right hand  . Hematochezia 2005  . Prostatitis   . Carpal tunnel syndrome   . Hypertension     Past Surgical History  Procedure Laterality Date  . Arthroscopic left knee surgery    . Left inguinal herniorrhaphy    . Carpal tunnel repair      Family History  Problem Relation Age of Onset  . Colon cancer Mother   . Brain cancer Father     Social History [name] reports that he quit smoking about 5 years ago. His smoking use included Cigarettes. He has a 18.5 pack-year smoking history. He does not have any smokeless tobacco history on file. [name] reports that he drinks alcohol.  Review of Systems No palpitations, dizziness, syncope. Otherwise negative.  Physical Examination Filed Vitals:   12/07/13 0837  BP: 138/90  Pulse: 48   Filed Weights   12/07/13 0837  Weight: 225 lb (102.059 kg)    The patient is comfortable at rest.  HEENT: Conjunctiva and lids normal, oropharynx clear.  Neck: Supple, no elevated JVP or carotid bruits, no thyromegaly.  Lungs: Clear to auscultation, nonlabored breathing at rest.  Cardiac: Regular rate and rhythm, no S3 or significant systolic murmur, no pericardial rub.  Abdomen: Soft, nontender, bowel sounds present, no guarding  or rebound.  Extremities: No pitting edema, distal pulses 2+.  Skin: Warm and dry.  Musculoskeletal: No kyphosis.  Neuropsychiatric: Alert and oriented x3, affect grossly appropriate.   Problem List and Plan   Abnormal myocardial perfusion study As reviewed above, abnormal ST segment changes with exercise, probable scar in the inferior and inferolateral wall, LVEF 46%. Patient has had recent symptoms concerning for angina, also some degree of relative functional limitation, but he is active and exercises regularly. We have discussed the matter, and plan is to pursue a diagnostic cardiac catheterization to better understand his coronary anatomy and determine if any other options such as  revascularization need to be considered. This is being scheduled with Dr. SwazilandJordan later in the week.  Precordial pain Has had recent symptoms that are more typical for angina in comparison to prior evaluation. With abnormal Cardiolite as discussed, plan is to pursue cardiac catheterization.   TOBACCO ABUSE Quit smoking 5 years ago.   Jonelle SidleSamuel G. Netanel Goodwin, M.D., F.A.C.C.

## 2013-12-07 NOTE — Assessment & Plan Note (Signed)
Quit smoking 5 years ago 

## 2013-12-07 NOTE — Assessment & Plan Note (Signed)
As reviewed above, abnormal ST segment changes with exercise, probable scar in the inferior and inferolateral wall, LVEF 46%. Patient has had recent symptoms concerning for angina, also some degree of relative functional limitation, but he is active and exercises regularly. We have discussed the matter, and plan is to pursue a diagnostic cardiac catheterization to better understand his coronary anatomy and determine if any other options such as revascularization need to be considered. This is being scheduled with Dr. SwazilandJordan later in the week.

## 2013-12-07 NOTE — Telephone Encounter (Signed)
° °    °  LEFT HEART CATHETERIZATION WITH CORONARY ANGIOGRAM     Scheduled for 12-11-13 Dr. Salley SlaughterPeter Joran Checking percert

## 2013-12-07 NOTE — Assessment & Plan Note (Addendum)
Has had recent symptoms that are more typical for angina in comparison to prior evaluation. With abnormal Cardiolite as discussed, plan is to pursue cardiac catheterization.

## 2013-12-07 NOTE — Telephone Encounter (Signed)
Pt has [employer], no precert required for heart cath.

## 2013-12-08 ENCOUNTER — Encounter: Payer: Self-pay | Admitting: *Deleted

## 2013-12-08 ENCOUNTER — Encounter (HOSPITAL_COMMUNITY): Payer: Self-pay | Admitting: Pharmacy Technician

## 2013-12-11 ENCOUNTER — Encounter (HOSPITAL_COMMUNITY)
Admission: RE | Disposition: A | Discharge status: Home / Self Care | Payer: Self-pay | Source: Ambulatory Visit | Attending: Cardiology

## 2013-12-11 ENCOUNTER — Encounter (HOSPITAL_COMMUNITY): Payer: Self-pay | Admitting: General Practice

## 2013-12-11 ENCOUNTER — Ambulatory Visit (HOSPITAL_COMMUNITY)
Admission: RE | Admit: 2013-12-11 | Discharge: 2013-12-12 | Disposition: A | Discharge status: Home / Self Care | Payer: BC Managed Care – PPO | Source: Ambulatory Visit | Attending: Cardiology | Admitting: Cardiology

## 2013-12-11 DIAGNOSIS — G252 Other specified forms of tremor: Secondary | ICD-10-CM

## 2013-12-11 DIAGNOSIS — G250 Essential tremor: Secondary | ICD-10-CM

## 2013-12-11 DIAGNOSIS — F17200 Nicotine dependence, unspecified, uncomplicated: Secondary | ICD-10-CM

## 2013-12-11 DIAGNOSIS — R072 Precordial pain: Secondary | ICD-10-CM

## 2013-12-11 DIAGNOSIS — Z7982 Long term (current) use of aspirin: Secondary | ICD-10-CM | POA: Insufficient documentation

## 2013-12-11 DIAGNOSIS — Z87891 Personal history of nicotine dependence: Secondary | ICD-10-CM | POA: Insufficient documentation

## 2013-12-11 DIAGNOSIS — R9439 Abnormal result of other cardiovascular function study: Secondary | ICD-10-CM

## 2013-12-11 DIAGNOSIS — I209 Angina pectoris, unspecified: Secondary | ICD-10-CM | POA: Diagnosis present

## 2013-12-11 DIAGNOSIS — R0602 Shortness of breath: Secondary | ICD-10-CM | POA: Insufficient documentation

## 2013-12-11 DIAGNOSIS — I2510 Atherosclerotic heart disease of native coronary artery without angina pectoris: Secondary | ICD-10-CM

## 2013-12-11 HISTORY — DX: Unspecified osteoarthritis, unspecified site: M19.90

## 2013-12-11 HISTORY — PX: LEFT HEART CATHETERIZATION WITH CORONARY ANGIOGRAM: SHX5451

## 2013-12-11 LAB — BASIC METABOLIC PANEL
BUN: 24 mg/dL — ABNORMAL HIGH (ref 6–23)
CO2: 24 meq/L (ref 19–32)
Calcium: 9.5 mg/dL (ref 8.4–10.5)
Chloride: 103 meq/L (ref 96–112)
Creatinine, Ser: 0.99 mg/dL (ref 0.50–1.35)
GFR calc Af Amer: >90 mL/min (ref >90)
GFR calc non Af Amer: 85 mL/min — ABNORMAL LOW (ref >90)
Glucose, Bld: 112 mg/dL — ABNORMAL HIGH (ref 70–99)
Potassium: 4.3 mEq/L (ref 3.7–5.3)
Sodium: 142 mEq/L (ref 137–147)

## 2013-12-11 LAB — POCT ACTIVATED CLOTTING TIME
Activated Clotting Time: 221 seconds
Activated Clotting Time: 365 seconds

## 2013-12-11 LAB — CBC
HCT: 41.2 % (ref 39.0–52.0)
Hemoglobin: 14.6 g/dL (ref 13.0–17.0)
MCH: 31.7 pg (ref 26.0–34.0)
MCHC: 35.4 g/dL (ref 30.0–36.0)
MCV: 89.6 fL (ref 78.0–100.0)
Platelets: 208 10*3/uL (ref 150–400)
RBC: 4.6 MIL/uL (ref 4.22–5.81)
RDW: 12.3 % (ref 11.5–15.5)
WBC: 5.9 10*3/uL (ref 4.0–10.5)

## 2013-12-11 LAB — PROTIME-INR
INR: 0.87 (ref 0.00–1.49)
Prothrombin Time: 11.7 seconds (ref 11.6–15.2)

## 2013-12-11 SURGERY — LEFT HEART CATHETERIZATION WITH CORONARY ANGIOGRAM
Anesthesia: LOCAL

## 2013-12-11 MED ORDER — TICAGRELOR 90 MG PO TABS
ORAL_TABLET | ORAL | Status: AC
  Filled 2013-12-11: qty 1

## 2013-12-11 MED ORDER — OMEGA-3 KRILL OIL 1000 MG PO CAPS: 1000.0000 mg | ORAL_CAPSULE | Freq: Every day | ORAL | Status: DC

## 2013-12-11 MED ORDER — CYANOCOBALAMIN 500 MCG PO TABS
500.0000 ug | ORAL_TABLET | Freq: Every day | ORAL | Status: DC
  Administered 2013-12-11 – 2013-12-12 (×2): 500 ug via ORAL
  Filled 2013-12-11 (×2): qty 1

## 2013-12-11 MED ORDER — FENTANYL CITRATE 0.05 MG/ML IJ SOLN
INTRAMUSCULAR | Status: AC
  Filled 2013-12-11: qty 2

## 2013-12-11 MED ORDER — MIDAZOLAM HCL 2 MG/2ML IJ SOLN
INTRAMUSCULAR | Status: AC
  Filled 2013-12-11: qty 2

## 2013-12-11 MED ORDER — SODIUM CHLORIDE 0.9 % IJ SOLN: 3.0000 mL | Freq: Two times a day (BID) | INTRAMUSCULAR | Status: DC

## 2013-12-11 MED ORDER — ASPIRIN 81 MG PO CHEW
81.0000 mg | CHEWABLE_TABLET | ORAL | Status: AC
  Administered 2013-12-11: 81 mg via ORAL
  Filled 2013-12-11: qty 1

## 2013-12-11 MED ORDER — ONDANSETRON HCL 4 MG/2ML IJ SOLN
INTRAMUSCULAR | Status: AC
  Filled 2013-12-11: qty 2

## 2013-12-11 MED ORDER — SODIUM CHLORIDE 0.9 % IV SOLN: 1.0000 mL/kg/h | INTRAVENOUS | Status: AC

## 2013-12-11 MED ORDER — TICAGRELOR 90 MG PO TABS
90.0000 mg | ORAL_TABLET | Freq: Two times a day (BID) | ORAL | Status: DC
  Administered 2013-12-11 – 2013-12-12 (×2): 90 mg via ORAL
  Filled 2013-12-11 (×3): qty 1

## 2013-12-11 MED ORDER — VITAMIN C 500 MG PO TABS
1000.0000 mg | ORAL_TABLET | Freq: Every day | ORAL | Status: DC
  Administered 2013-12-11 – 2013-12-12 (×2): 1000 mg via ORAL
  Filled 2013-12-11 (×2): qty 2

## 2013-12-11 MED ORDER — HEPARIN (PORCINE) IN NACL 2-0.9 UNIT/ML-% IJ SOLN
INTRAMUSCULAR | Status: AC
  Filled 2013-12-11: qty 1000

## 2013-12-11 MED ORDER — HYDROMORPHONE HCL PF 1 MG/ML IJ SOLN
INTRAMUSCULAR | Status: AC
  Filled 2013-12-11: qty 1

## 2013-12-11 MED ORDER — NITROGLYCERIN 0.2 MG/ML ON CALL CATH LAB
INTRAVENOUS | Status: AC
  Filled 2013-12-11: qty 1

## 2013-12-11 MED ORDER — VITAMIN B-12 500 MCG PO TABS: 500.0000 ug | ORAL_TABLET | Freq: Every day | ORAL | Status: DC

## 2013-12-11 MED ORDER — ASPIRIN 81 MG PO CHEW
81.0000 mg | CHEWABLE_TABLET | Freq: Every day | ORAL | Status: DC
  Administered 2013-12-12: 09:00:00 81 mg via ORAL
  Filled 2013-12-11: qty 1

## 2013-12-11 MED ORDER — SODIUM CHLORIDE 0.9 % IJ SOLN: 3.0000 mL | INTRAMUSCULAR | Status: DC | PRN

## 2013-12-11 MED ORDER — LIDOCAINE HCL (PF) 1 % IJ SOLN
INTRAMUSCULAR | Status: AC
  Filled 2013-12-11: qty 30

## 2013-12-11 MED ORDER — HEPARIN SODIUM (PORCINE) 1000 UNIT/ML IJ SOLN
INTRAMUSCULAR | Status: AC
  Filled 2013-12-11: qty 1

## 2013-12-11 MED ORDER — VERAPAMIL HCL 2.5 MG/ML IV SOLN
INTRAVENOUS | Status: AC
  Filled 2013-12-11: qty 2

## 2013-12-11 MED ORDER — PROPRANOLOL HCL 10 MG PO TABS
5.0000 mg | ORAL_TABLET | Freq: Two times a day (BID) | ORAL | Status: DC
  Administered 2013-12-12: 09:00:00 5 mg via ORAL
  Filled 2013-12-11 (×4): qty 0.5

## 2013-12-11 MED ORDER — ACTIVE PARTNERSHIP FOR HEALTH OF YOUR HEART BOOK
Freq: Once | Status: AC
  Administered 2013-12-12: 06:00:00
  Filled 2013-12-11: qty 1

## 2013-12-11 MED ORDER — SODIUM CHLORIDE 0.9 % IV SOLN: 250.0000 mL | INTRAVENOUS | Status: DC | PRN

## 2013-12-11 MED ORDER — SODIUM CHLORIDE 0.9 % IV SOLN
INTRAVENOUS | Status: DC
  Administered 2013-12-11: 06:00:00 via INTRAVENOUS

## 2013-12-11 MED ORDER — SAW PALMETTO 450 MG PO CAPS: 450.0000 mg | ORAL_CAPSULE | Freq: Every day | ORAL | Status: DC

## 2013-12-11 MED ORDER — ATORVASTATIN CALCIUM 80 MG PO TABS
80.0000 mg | ORAL_TABLET | Freq: Every day | ORAL | Status: DC
  Administered 2013-12-11: 80 mg via ORAL
  Filled 2013-12-11 (×2): qty 1

## 2013-12-11 NOTE — Care Management Note (Addendum)
  Page 2 of 2   12/11/2013     4:29:35 PM   CARE MANAGEMENT NOTE 12/11/2013  Patient:  Jeffery Goodwin,Jeffery Goodwin   Account Number:  [id number]  Date Initiated:  12/11/2013  Documentation initiated by:  Jeffery SchultzHUTCHINSON,Jeffery Goodwin  Subjective/Objective Assessment:   Heart cath     Action/Plan:   CM to follow for disposition needs   Anticipated DC Date:  12/12/2013   Anticipated DC Plan:  HOME/SELF CARE         Choice offered to / List presented to:             Status of service:  In process, will continue to follow Medicare Important Message given?   (If response is "NO", the following Medicare IM given date fields will be blank) Date Medicare IM given:   Date Additional Medicare IM given:    Discharge Disposition:    Per UR Regulation:    If discussed at Long Length of Stay Meetings, dates discussed:    Comments:  PT COPAY WILL BE $65- NO PRIOR AUTH REQUIRED  ---12/11/2013 1556 by Jeffery SchultzRYSTAL Arnesia Vincelette--- Benefits check (BRILINTA) tablet 90 mg  :  Dose 90 mg  :  Oral  :  2 times daily coverage, copay, authorization requirements, deductibles Cath lab patient. Thanks Northrop GrummanCrystal Satoru Milich Goodwin, Jeffery Goodwin, Jeffery Goodwin, Jeffery Goodwin [phone] 3:56pm   LEFT HEART CATHETERIZATION WITH CORONARY ANGIOGRAM (N/A) as a surgical intervention  Per hand off report: ---12/11/2013 0928 by Trudee KusterSHARYN YOUNG--- cathed/ intervention/ OIB documented Jeffery Schultzrystal Parrish Bonn, Goodwin, Jeffery Goodwin, MSHL, CCM 12/11/2013

## 2013-12-11 NOTE — Progress Notes (Signed)
PHARMACIST - PHYSICIAN ORDER COMMUNICATION  CONCERNING: P&T Medication Policy on Herbal Medications  DESCRIPTION:  This patient's order for:  Krill oil & saw palmetto  has been noted.  This product(s) is classified as an "herbal" or natural product. Due to a lack of definitive safety studies or FDA approval, nonstandard manufacturing practices, plus the potential risk of unknown drug-drug interactions while on inpatient medications, the Pharmacy and Therapeutics Committee does not permit the use of "herbal" or natural products of this type within [facility].   ACTION TAKEN: The pharmacy department is unable to verify this order at this time and your patient has been informed of this safety policy. Please reevaluate patient's clinical condition at discharge and address if the herbal or natural product(s) should be resumed at that time.

## 2013-12-11 NOTE — CV Procedure (Signed)
    Cardiac Catheterization Procedure Note  Name: Jeffery Goodwin Mikkelson MRN: [id number] DOB: 12/10/1950  Procedure: Left Heart Cath, Selective Coronary Angiography, LV angiography, PTCA and stenting of the LCx  Indication: 63 yo WM presents with class 2 angina on medical therapy. Myoview study is intermediate risk with inferolateral perfusion defect and EF 46%.  Procedural Details:  The right wrist was prepped, draped, and anesthetized with 1% lidocaine. Using the modified Seldinger technique, a 6 French slender sheath was introduced into the right radial artery. 3 mg of verapamil was administered through the sheath, weight-based unfractionated heparin was administered intravenously. Standard Judkins catheters were used for selective coronary angiography and left ventriculography. Catheter exchanges were performed over an exchange length guidewire.  PROCEDURAL FINDINGS Hemodynamics: AO 139/72 mean 98 mm Hg LV 134/17 mm Hg   Coronary angiography: Coronary dominance: right  Left mainstem: Normal   Left anterior descending (LAD): There is a smooth lesion in the proximal LAD with 50% stenosis. The far distal LAD has 70% disease at the apex.   Left circumflex (LCx): The LCx gives rise to 2 large OM branches. The first OM has 30% disease proximally. The second OM has a 90-95% stenosis at the bifurcation with the distal LCx.   Right coronary artery (RCA): Inferior takeoff. There is diffuse disease in the mid vessel up to 40%.   Left ventriculography: Left ventricular systolic function is abnormal, there is diffuse hypokinesis, LVEF is estimated at 45%, there is no significant mitral regurgitation   PCI Note:  Following the diagnostic procedure, the decision was made to proceed with PCI of the mid LCx/OM2. Brilinta 180 mg was given orally.  Weight-based heparin was given for anticoagulation. Once a therapeutic ACT was achieved, a 6 [place] XBLAD 3.5 guide catheter was inserted.  A prowater coronary  guidewire was used.  Initially the wire selected the distal LCx branch. Due to acute angulation of the OM I was unable to cross into the OM. With wire crossing of the lesion the vessel occluded and the patient developed severe chest pain. The lesion was predilated with a 2.25 balloon into the distal LCx. I was then able to pass a second prowater wire into the OM. The lesion was dilated again with the 2.25 mm balloon into the OM. Blood flow was restored. A 2.75 x 23 mm Alpine stent was then used into the OM and across the distal LCx.   The stent was postdilated with a 3.0 mm noncompliant balloon.  Following PCI, there was 0% residual stenosis and TIMI-3 flow. There was no compromise of the distal LCx.  Final angiography confirmed an excellent result. The patient tolerated the procedure well. There were no immediate procedural complications. A TR band was used for radial hemostasis. The patient was transferred to the post catheterization recovery area for further monitoring.  PCI Data: Vessel - LCx/Segment - mid Percent Stenosis (pre)  95% TIMI-flow 3 Stent 2.75 x 23 mm Alpine Percent Stenosis (post) 0% TIMI-flow (post) 3  Final Conclusions:   1. Single vessel obstructive CAD. Moderate plaque in all 3 vessels. 2. Mild LV dysfunction.  3. Successful stenting of the mid LCx/OM2 with a DES   Recommendations:  Aggressive risk factor modification. Continue dual antiplatelet therapy for one year.  Demetria Poreeter M Morris VillageJordanMD,FACC 12/11/2013, 9:05 AM

## 2013-12-11 NOTE — H&P (View-Only) (Signed)
Clinical Summary Jeffery Goodwin is a 63 y.o.male seen recently in late March for cardiology consultation secondary to chest discomfort and shortness of breath. History is reviewed in the previous note. He was referred for followup ischemic evaluation.  Exercise Cardiolite done on March 23 showed abnormal ST segment changes with exercise, probable scar in the inferior and inferolateral wall, LVEF 46% with inferior hypokinesis. We discussed the results today.  He is still exercising regularly, swimming and doing some cycling as noted previously. He has cut back the distance of his typical bike rides, still doesn't "quite feel right" but has not had chest pain in the last week.  We discussed proceeding to a diagnostic cardiac catheterization to most clearly define his coronary anatomy, and determine if any additional strategies such as revascularization need to be considered. After discussing the potential risks and benefits, he is in agreement to proceed.  No Known Allergies  Current Outpatient Prescriptions  Medication Sig Dispense Refill  . ascorbic Acid (VITAMIN C CR) 500 MG CPCR Take 500 mg by mouth daily.        Marland Kitchen aspirin 81 MG chewable tablet Chew by mouth daily.      . meloxicam (MOBIC) 7.5 MG tablet Take 3.75 mg by mouth daily. Takes 1/2 tablet qd      . Misc Natural Products (GLUCOS-CHONDROIT-MSM COMPLEX PO) Take 1 tablet by mouth daily.      . Omega-3 Krill Oil 1000 MG CAPS Take 1,000 mg by mouth daily.      Marland Kitchen POTASSIUM PO Take 193 mg by mouth daily.      . propranolol (INDERAL) 10 MG tablet Take 1.5 tablets (15 mg total) by mouth daily. Take 1 tab am,1/2 tab pm  45 tablet  3  . saw palmetto 500 MG capsule Take 500 mg by mouth daily.      . SODIUM CHLORIDE PO Take 1 tablet by mouth daily.      . vitamin B-12 (CYANOCOBALAMIN) 500 MCG tablet Take 500 mcg by mouth daily.         No current facility-administered medications for this visit.    Past Medical History  Diagnosis Date  .  Tremor     Right hand  . Hematochezia 2005  . Prostatitis   . Carpal tunnel syndrome   . Hypertension     Past Surgical History  Procedure Laterality Date  . Arthroscopic left knee surgery    . Left inguinal herniorrhaphy    . Carpal tunnel repair      Family History  Problem Relation Age of Onset  . Colon cancer Mother   . Brain cancer Father     Social History Jeffery Goodwin reports that he quit smoking about 5 years ago. His smoking use included Cigarettes. He has a 18.5 pack-year smoking history. He does not have any smokeless tobacco history on file. Jeffery Goodwin reports that he drinks alcohol.  Review of Systems No palpitations, dizziness, syncope. Otherwise negative.  Physical Examination Filed Vitals:   12/07/13 0837  BP: 138/90  Pulse: 48   Filed Weights   12/07/13 0837  Weight: 225 lb (102.059 kg)    The patient is comfortable at rest.  HEENT: Conjunctiva and lids normal, oropharynx clear.  Neck: Supple, no elevated JVP or carotid bruits, no thyromegaly.  Lungs: Clear to auscultation, nonlabored breathing at rest.  Cardiac: Regular rate and rhythm, no S3 or significant systolic murmur, no pericardial rub.  Abdomen: Soft, nontender, bowel sounds present, no guarding  or rebound.  Extremities: No pitting edema, distal pulses 2+.  Skin: Warm and dry.  Musculoskeletal: No kyphosis.  Neuropsychiatric: Alert and oriented x3, affect grossly appropriate.   Problem List and Plan   Abnormal myocardial perfusion study As reviewed above, abnormal ST segment changes with exercise, probable scar in the inferior and inferolateral wall, LVEF 46%. Patient has had recent symptoms concerning for angina, also some degree of relative functional limitation, but he is active and exercises regularly. We have discussed the matter, and plan is to pursue a diagnostic cardiac catheterization to better understand his coronary anatomy and determine if any other options such as  revascularization need to be considered. This is being scheduled with Dr. [place] later in the week.  Precordial pain Has had recent symptoms that are more typical for angina in comparison to prior evaluation. With abnormal Cardiolite as discussed, plan is to pursue cardiac catheterization.   TOBACCO ABUSE Quit smoking 5 years ago.   Jeffery Goodwin, M.D., F.A.C.C.

## 2013-12-11 NOTE — Interval H&P Note (Signed)
History and Physical Interval Note:  12/11/2013 7:24 AM  Jeffery Goodwin  has presented today for surgery, with the diagnosis of abnormal stress test  The various methods of treatment have been discussed with the patient and family. After consideration of risks, benefits and other options for treatment, the patient has consented to  Procedure(s): LEFT HEART CATHETERIZATION WITH CORONARY ANGIOGRAM (N/A) as a surgical intervention .  The patient's history has been reviewed, patient examined, no change in status, stable for surgery.  I have reviewed the patient's chart and labs.  Questions were answered to the patient's satisfaction.   Cath Lab Visit (complete for each Cath Lab visit)  Clinical Evaluation Leading to the Procedure:   ACS: yes  Non-ACS:    Anginal Classification: CCS II  Anti-ischemic medical therapy: Minimal Therapy (1 class of medications)  Non-Invasive Test Results: Intermediate-risk stress test findings: cardiac mortality 1-3%/year  Prior CABG: No previous CABG        Demetria Poreeter M [facility]JordanMD,FACC 12/11/2013 7:24 AM

## 2013-12-11 NOTE — Progress Notes (Signed)
TR BAND REMOVAL  LOCATION:    right radial  DEFLATED PER PROTOCOL:    yes  TIME BAND OFF / DRESSING APPLIED:    1245   SITE UPON ARRIVAL:    Level 0  SITE AFTER BAND REMOVAL:    Level 0  REVERSE ALLEN'S TEST:     positive  CIRCULATION SENSATION AND MOVEMENT:    Within Normal Limits   yes  COMMENTS:   Tolerated procedure well 

## 2013-12-12 ENCOUNTER — Other Ambulatory Visit: Payer: Self-pay | Admitting: Physician Assistant

## 2013-12-12 DIAGNOSIS — G250 Essential tremor: Secondary | ICD-10-CM

## 2013-12-12 DIAGNOSIS — R9439 Abnormal result of other cardiovascular function study: Secondary | ICD-10-CM

## 2013-12-12 DIAGNOSIS — I2510 Atherosclerotic heart disease of native coronary artery without angina pectoris: Secondary | ICD-10-CM

## 2013-12-12 DIAGNOSIS — G252 Other specified forms of tremor: Secondary | ICD-10-CM

## 2013-12-12 DIAGNOSIS — F17200 Nicotine dependence, unspecified, uncomplicated: Secondary | ICD-10-CM

## 2013-12-12 DIAGNOSIS — I209 Angina pectoris, unspecified: Secondary | ICD-10-CM

## 2013-12-12 DIAGNOSIS — R072 Precordial pain: Secondary | ICD-10-CM

## 2013-12-12 LAB — BASIC METABOLIC PANEL
BUN: 15 mg/dL (ref 6–23)
CO2: 23 mEq/L (ref 19–32)
Calcium: 9.3 mg/dL (ref 8.4–10.5)
Chloride: 104 mEq/L (ref 96–112)
Creatinine, Ser: 0.92 mg/dL (ref 0.50–1.35)
GFR calc Af Amer: >90 mL/min (ref >90)
GFR calc non Af Amer: 87 mL/min — ABNORMAL LOW (ref >90)
Glucose, Bld: 103 mg/dL — ABNORMAL HIGH (ref 70–99)
Potassium: 4.2 meq/L (ref 3.7–5.3)
Sodium: 142 mEq/L (ref 137–147)

## 2013-12-12 LAB — CBC
HCT: 40.5 % (ref 39.0–52.0)
Hemoglobin: 14 g/dL (ref 13.0–17.0)
MCH: 31.2 pg (ref 26.0–34.0)
MCHC: 34.6 g/dL (ref 30.0–36.0)
MCV: 90.2 fL (ref 78.0–100.0)
Platelets: 193 10*3/uL (ref 150–400)
RBC: 4.49 MIL/uL (ref 4.22–5.81)
RDW: 12.5 % (ref 11.5–15.5)
WBC: 5.8 10*3/uL (ref 4.0–10.5)

## 2013-12-12 MED ORDER — NITROGLYCERIN 0.4 MG SL SUBL: 0.4000 mg | SUBLINGUAL_TABLET | SUBLINGUAL | Status: DC | PRN

## 2013-12-12 MED ORDER — ATORVASTATIN CALCIUM 80 MG PO TABS: 80.0000 mg | ORAL_TABLET | Freq: Every day | ORAL | Status: DC

## 2013-12-12 MED ORDER — TICAGRELOR 90 MG PO TABS: 90.0000 mg | ORAL_TABLET | Freq: Two times a day (BID) | ORAL | Status: DC

## 2013-12-12 NOTE — Progress Notes (Signed)
Subjective: No complaints  Objective: Vital signs in last 24 hours: Temp:  [97.7 F (36.5 C)-98.2 F (36.8 C)] 98.2 F (36.8 C) (04/11 0730) Pulse Rate:  [39-45] 45 (04/11 0730) Resp:  [17-18] 18 (04/11 0730) BP: (123-149)/(73-82) 123/73 mmHg (04/11 0730) SpO2:  [96 %-100 %] 96 % (04/11 0730) Weight:  [219 lb 2.2 oz (99.4 kg)] 219 lb 2.2 oz (99.4 kg) (04/11 0007) Last BM Date: 12/11/13  Intake/Output from previous day: 04/10 0701 - 04/11 0700 In: 1027.2 [P.O.:240; I.V.:787.2] Out: 2000 [Urine:2000] Intake/Output this shift:    Medications Current Facility-Administered Medications  Medication Dose Route Frequency Provider Last Rate Last Dose  . aspirin chewable tablet 81 mg  81 mg Oral Daily Peter M Martinique, MD      . atorvastatin (LIPITOR) tablet 80 mg  80 mg Oral q1800 Peter M Martinique, MD   80 mg at 12/11/13 1648  . cyanocobalamin tablet 500 mcg  500 mcg Oral Daily Peter M Martinique, MD   500 mcg at 12/11/13 1146  . propranolol (INDERAL) tablet 5 mg  5 mg Oral BID Peter M Martinique, MD      . Ticagrelor [facility]) tablet 90 mg  90 mg Oral BID Peter M Martinique, MD   90 mg at 12/11/13 2024  . vitamin C (ASCORBIC ACID) tablet 1,000 mg  1,000 mg Oral Daily Peter M Martinique, MD   1,000 mg at 12/11/13 1146    PE: General appearance: alert, cooperative and no distress  Lungs: clear to auscultation bilaterally  Heart: regular rate and rhythm, S1, S2 normal, no murmur, click, rub or gallop  Extremities: No LEE  Pulses: 2+ and symmetric  Skin: Warm and dry.  right wrist-no ecchymosis or tenderness.  Neurologic: Grossly normal   PROCEDURAL FINDINGS  Hemodynamics:  AO 139/72 mean 98 mm Hg  LV 134/17 mm Hg  Coronary angiography:  Coronary dominance: right  Left mainstem: Normal  Left anterior descending (LAD): There is a smooth lesion in the proximal LAD with 50% stenosis. The far distal LAD has 70% disease at the apex.  Left circumflex (LCx): The LCx gives rise to 2 large OM  branches. The first OM has 30% disease proximally. The second OM has a 90-95% stenosis at the bifurcation with the distal LCx.  Right coronary artery (RCA): Inferior takeoff. There is diffuse disease in the mid vessel up to 40%.  Left ventriculography: Left ventricular systolic function is abnormal, there is diffuse hypokinesis, LVEF is estimated at 45%, there is no significant mitral regurgitation  PCI Note: Following the diagnostic procedure, the decision was made to proceed with PCI of the mid LCx/OM2. Brilinta 180 mg was given orally. Weight-based heparin was given for anticoagulation. Once a therapeutic ACT was achieved, a 6 [place] XBLAD 3.5 guide catheter was inserted. A prowater coronary guidewire was used. Initially the wire selected the distal LCx branch. Due to acute angulation of the OM I was unable to cross into the OM. With wire crossing of the lesion the vessel occluded and the patient developed severe chest pain. The lesion was predilated with a 2.25 balloon into the distal LCx. I was then able to pass a second prowater wire into the OM. The lesion was dilated again with the 2.25 mm balloon into the OM. Blood flow was restored. A 2.75 x 23 mm Alpine stent was then used into the OM and across the distal LCx. The stent was postdilated with a 3.0 mm noncompliant balloon. Following PCI, there was  0% residual stenosis and TIMI-3 flow. There was no compromise of the distal LCx. Final angiography confirmed an excellent result. The patient tolerated the procedure well. There were no immediate procedural complications. A TR band was used for radial hemostasis. The patient was transferred to the post catheterization recovery area for further monitoring.   PCI Data:  Vessel - LCx/Segment - mid  Percent Stenosis (pre) 95%  TIMI-flow 3  Stent 2.75 x 23 mm Alpine  Percent Stenosis (post) 0%  TIMI-flow (post) 3   Final Conclusions:  1. Single vessel obstructive CAD. Moderate plaque in all 3 vessels.    2. Mild LV dysfunction.  3. Successful stenting of the mid LCx/OM2 with a DES  Recommendations:  Aggressive risk factor modification. Continue dual antiplatelet therapy for one year.  Ander Slade [facility]   Lab Results:   Recent Labs  12/11/13 0553 12/12/13 0448  WBC 5.9 5.8  HGB 14.6 14.0  HCT 41.2 40.5  PLT 208 193   BMET  Recent Labs  12/11/13 0553 12/12/13 0448  NA 142 142  K 4.3 4.2  CL 103 104  CO2 24 23  GLUCOSE 112* 103*  BUN 24* 15  CREATININE 0.99 0.92  CALCIUM 9.5 9.3   PT/INR  Recent Labs  12/11/13 0553  LABPROT 11.7  INR 0.87     Assessment/Plan  63 y.o.male seen recently in late March for cardiology consultation secondary to chest discomfort and shortness of breath. History is reviewed in the previous note. He was referred for followup ischemic evaluation.  Exercise Cardiolite done on March 23 showed abnormal ST segment changes with exercise, probable scar in the inferior and inferolateral wall, LVEF 46% with inferior hypokinesis. We discussed the results today.  He is still exercising regularly, swimming and doing some cycling as noted previously. He has cut back the distance of his typical bike rides, still doesn't "quite feel right" but has not had chest pain in the last week.  We discussed proceeding to a diagnostic cardiac catheterization.  Quit smoking 5 years ago.   Swims and cycles daily.  Active Problems:   Angina pectoris Left heart cath revealed single vessel CAD and mild LV dysfunction.  He underwent successful placement of a DES to the mid LCx/OM2.  ASA/ Brilinta.   BP is stable.  On Propranolol 11m BID for tremor.  HR is as low as 34 at night and has always been like that.  Multiple PACs.   He has been apprehensive about taking a statin but will try the 80 of  lipitor Dr. KClaiborne Billingsstarted. Need to check LFT's/lipids in three months.     LOS: 1 day    BTarri FullerPA-C 12/12/2013 7:44 AM  The patient was seen, examined and discussed  with BTarri Fuller PA-C and I agree with the above.   Mr AFaulkenberryis a 63year old previously healthy male who underwent cardiac cath for an abnormal stress test with finding of single vessel disease --> s/p PCI/DES OM2. Asymptomatic post procedure. He will require dual antiplatelet therapy for at least a year. Continue high dose atorvastatin. We will start low dose Lisinopril 5 mg daily for mildly decreased LVEF on nuclear study. I would recommend to repeat an outpatient echocardiogram for more accurate LVEF assessment.  No additional betablocker as he is on Propranolol for tremor and bradycardiac.  He should follow with Dr MDomenic Politein his clinic with [facility] in 3 weeks.   KDorothy Spark4/07/2014

## 2013-12-12 NOTE — Progress Notes (Signed)
CARDIAC REHAB PHASE I   PRE:  Rate/Rhythm: 65 SR pac's  BP:  Sitting: 147/88     SaO2: 100% RA  MODE:  Ambulation: 500 ft   POST:  Rate/Rhythm: 62 SR pac's  BP:  Sitting: 170/94 Recheck: 152/96    SaO2: 100% RA 9:30AM-10:20AM  Patient tolerated walk well with no complaints.  [facility]    Theresa DutyMolly M Brady, MS 12/12/2013 10:16 AM

## 2013-12-12 NOTE — Discharge Summary (Signed)
Physician Discharge Summary     Patient ID: Jeffery Goodwin MRN: [id number] DOB/AGE: August 26, 1951 63 y.o.  Admit date: 12/11/2013 Discharge date: 12/12/2013  Admission Diagnoses:  Angina  Discharge Diagnoses:  Active Problems:   Angina pectoris   Discharged Condition: stable  Hospital Course:   63 y.o.male seen recently in late March for cardiology consultation secondary to chest discomfort and shortness of breath.   He was referred for followup ischemic evaluation. Exercise Cardiolite done on March 23 showed abnormal ST segment changes with exercise, probable scar in the inferior and inferolateral wall, LVEF 46% with inferior hypokinesis. We discussed the results today. He is still exercising regularly, swimming and doing some cycling as noted previously. He has cut back the distance of his typical bike rides, still doesn't "quite feel right" but has not had chest pain in the last week. We discussed proceeding to a diagnostic cardiac catheterization. Quit smoking 5 years ago. Swims and cycles daily.   Left heart cath revealed single vessel CAD and mild LV dysfunction. He underwent successful placement of a DES to the mid LCx/OM2. ASA/ Brilinta. BP is stable. On Propranolol 15mg  daily for tremor. HR is as low as 34 at night and has always been like that. Multiple PACs. He has been apprehensive about taking a statin but will try the 80 of lipitor Dr. Tresa Endo started. Need to check LFT's/lipids in three weeks.  The patient was seen by Dr. Delton See who felt he was stable for DC home.   Out patient echo and CMET ordered.   Consults: None  Significant Diagnostic Studies:  Left Heart Cath  PROCEDURAL FINDINGS  Hemodynamics:  AO 139/72 mean 98 mm Hg  LV 134/17 mm Hg  Coronary angiography:  Coronary dominance: right  Left mainstem: Normal  Left anterior descending (LAD): There is a smooth lesion in the proximal LAD with 50% stenosis. The far distal LAD has 70% disease at the apex.  Left  circumflex (LCx): The LCx gives rise to 2 large OM branches. The first OM has 30% disease proximally. The second OM has a 90-95% stenosis at the bifurcation with the distal LCx.  Right coronary artery (RCA): Inferior takeoff. There is diffuse disease in the mid vessel up to 40%.  Left ventriculography: Left ventricular systolic function is abnormal, there is diffuse hypokinesis, LVEF is estimated at 45%, there is no significant mitral regurgitation  PCI Note: Following the diagnostic procedure, the decision was made to proceed with PCI of the mid LCx/OM2. Brilinta 180 mg was given orally. Weight-based heparin was given for anticoagulation. Once a therapeutic ACT was achieved, a 6 [place] XBLAD 3.5 guide catheter was inserted. A prowater coronary guidewire was used. Initially the wire selected the distal LCx branch. Due to acute angulation of the OM I was unable to cross into the OM. With wire crossing of the lesion the vessel occluded and the patient developed severe chest pain. The lesion was predilated with a 2.25 balloon into the distal LCx. I was then able to pass a second prowater wire into the OM. The lesion was dilated again with the 2.25 mm balloon into the OM. Blood flow was restored. A 2.75 x 23 mm Alpine stent was then used into the OM and across the distal LCx. The stent was postdilated with a 3.0 mm noncompliant balloon. Following PCI, there was 0% residual stenosis and TIMI-3 flow. There was no compromise of the distal LCx. Final angiography confirmed an excellent result. The patient tolerated the procedure well. There  were no immediate procedural complications. A TR band was used for radial hemostasis. The patient was transferred to the post catheterization recovery area for further monitoring.  PCI Data:  Vessel - LCx/Segment - mid  Percent Stenosis (pre) 95%  TIMI-flow 3  Stent 2.75 x 23 mm Alpine  Percent Stenosis (post) 0%  TIMI-flow (post) 3  Final Conclusions:  1. Single vessel  obstructive CAD. Moderate plaque in all 3 vessels.  2. Mild LV dysfunction.  3. Successful stenting of the mid LCx/OM2 with a DES  Recommendations:  Aggressive risk factor modification. Continue dual antiplatelet therapy for one year.  Demetria Poreeter M Grossnick[facility] Eye Center IncJordanMD,FACC   Treatments: See above  Discharge Exam: Blood pressure 123/73, pulse 45, temperature 98.2 F (36.8 C), temperature source Oral, resp. rate 18, height 6\' 1"  (1.854 m), weight 219 lb 2.2 oz (99.4 kg), SpO2 96.00%.   Disposition: ED Dismiss - Never Arrived      Discharge Orders   Future Orders Complete By Expires   Diet - low sodium heart healthy  As directed    Increase activity slowly  As directed        Medication List         ascorbic acid 1000 MG tablet  Commonly known as:  VITAMIN C  Take 1,000 mg by mouth daily.     aspirin 81 MG chewable tablet  Chew 81 mg by mouth daily.     atorvastatin 80 MG tablet  Commonly known as:  LIPITOR  Take 1 tablet (80 mg total) by mouth daily at 6 PM.     GLUCOS-CHONDROIT-MSM COMPLEX PO  Take 3 tablets by mouth every morning.     meloxicam 7.5 MG tablet  Commonly known as:  MOBIC  Take 3.75 mg by mouth every morning. Takes 1/2 tablet qd     nitroGLYCERIN 0.4 MG SL tablet  Commonly known as:  NITROSTAT  Place 1 tablet (0.4 mg total) under the tongue every 5 (five) minutes as needed for chest pain.     Omega-3 Krill Oil 1000 MG Caps  Take 1,000 mg by mouth daily.     Omega-3 Krill Oil 300 MG Caps  Take 300 mg by mouth daily.     propranolol 10 MG tablet  Commonly known as:  INDERAL  Take 5-10 mg by mouth 2 (two) times daily. Take 10 mg every morning and take 5 mg at noon     Saw Palmetto 450 MG Caps  Take 450 mg by mouth daily.     Ticagrelor 90 MG Tabs tablet  Commonly known as:  BRILINTA  Take 1 tablet (90 mg total) by mouth 2 (two) times daily.     vitamin B-12 500 MCG tablet  Commonly known as:  CYANOCOBALAMIN  Take 500 mcg by mouth daily.        Follow-up Information   Follow up with Nona DellSamuel McDowell, MD. (The office willcall you with the appt date and time. )    Specialty:  Cardiology   Contact information:   [address] [place] [address] [phone]       Signed: Wilburt FinlayBryan Hager, [facility] 12/12/2013, 9:49 AM  Lars MassonKatarina H Samika Vetsch 12/12/2013

## 2013-12-14 ENCOUNTER — Telehealth: Payer: Self-pay | Admitting: Cardiovascular Disease

## 2013-12-14 DIAGNOSIS — Z5181 Encounter for therapeutic drug level monitoring: Secondary | ICD-10-CM

## 2013-12-14 LAB — CBC
HCT: 40.3 % (ref 39.0–52.0)
Hemoglobin: 14.2 g/dL (ref 13.0–17.0)
MCH: 30.5 pg (ref 26.0–34.0)
MCHC: 35.2 g/dL (ref 30.0–36.0)
MCV: 86.7 fL (ref 78.0–100.0)
Platelets: 249 10*3/uL (ref 150–400)
RBC: 4.65 MIL/uL (ref 4.22–5.81)
RDW: 13.2 % (ref 11.5–15.5)
WBC: 7.2 10*3/uL (ref 4.0–10.5)

## 2013-12-14 MED ORDER — PANTOPRAZOLE SODIUM 40 MG PO TBEC: 40.0000 mg | DELAYED_RELEASE_TABLET | Freq: Every day | ORAL | Status: DC

## 2013-12-14 MED ORDER — CLOPIDOGREL BISULFATE 75 MG PO TABS
75.0000 mg | ORAL_TABLET | Freq: Every day | ORAL | Status: DC
Start: 2013-12-14 — End: 2014-12-07

## 2013-12-14 NOTE — Telephone Encounter (Signed)
Patient states that he thinks he is having reaction to medicine that was started post stent placement on Friday 12/11/13.  Please call patient to get details of symptoms. / tgs

## 2013-12-14 NOTE — Telephone Encounter (Signed)
Patient had stent placed Friday 12/11/13,sent home Saturday where he noted "dark" stool x 1. Sunday he had 2 stools that were "black" and today has had two stools that are "black and tarry". He is concerned because of what he read is a adverse reaction while on the Brilinta. He denies any abdominal pain.

## 2013-12-14 NOTE — Telephone Encounter (Signed)
Spoke with patient,[facility] begin Plavix 75 mg qd, and Protonix 40 mg qd and have cbc drawn today  Made f/u apt this Thursday 4/16 @1320  hrs

## 2013-12-14 NOTE — Telephone Encounter (Signed)
Reviewed records. He had DES to mid circumflex/OM2 on April 10. Main concern would be whether he is having evidence of GI bleeding. For now would have him change to Plavix 75 mg instead of Brilinta and stay on ASA. Check CBC. Also start proton pump inhibitor to protect stomach - Protonix 40 mg daily. Make sure he has office visit soon - within a week.

## 2013-12-15 NOTE — Telephone Encounter (Signed)
Per K.Lawrence NP , patient will remain on Plavix

## 2013-12-17 ENCOUNTER — Encounter: Payer: Self-pay | Admitting: Cardiology

## 2013-12-17 ENCOUNTER — Ambulatory Visit (INDEPENDENT_AMBULATORY_CARE_PROVIDER_SITE_OTHER): Payer: BC Managed Care – PPO | Admitting: Cardiology

## 2013-12-17 VITALS — BP 141/77 | HR 50 | Ht 73.0 in | Wt 217.0 lb

## 2013-12-17 DIAGNOSIS — E7800 Pure hypercholesterolemia, unspecified: Secondary | ICD-10-CM

## 2013-12-17 DIAGNOSIS — IMO0001 Reserved for inherently not codable concepts without codable children: Secondary | ICD-10-CM | POA: Insufficient documentation

## 2013-12-17 DIAGNOSIS — I2510 Atherosclerotic heart disease of native coronary artery without angina pectoris: Secondary | ICD-10-CM

## 2013-12-17 DIAGNOSIS — R030 Elevated blood-pressure reading, without diagnosis of hypertension: Secondary | ICD-10-CM

## 2013-12-17 MED ORDER — ATORVASTATIN CALCIUM 80 MG PO TABS: ORAL_TABLET | ORAL | Status: DC

## 2013-12-17 NOTE — Patient Instructions (Addendum)
  Your physician recommends that you schedule a follow-up appointment in: 2 months    Your physician has recommended you make the following change in your medication:     Take lipitor 40 mg every other day   Please get blood work just prior to next visit   Thank you for choosing [facility] !

## 2013-12-17 NOTE — Addendum Note (Signed)
Addended by: Marlyn CorporalARLTON, CATHERINE A on: 12/17/2013 02:04 PM   Modules accepted: Orders

## 2013-12-17 NOTE — Assessment & Plan Note (Addendum)
Status post DES to the mid circumflex/second obtuse marginal, otherwise medically managed residual disease as outlined in the catheterization report. Continue current regimen, he is tolerating aspirin and Plavix. We will reduce Lipitor to 40 mg every other day to see if he tolerates this, if so plan followup FLP and LFT for his followup visit. Gradually increase activities, I will see him back in 2 months.

## 2013-12-17 NOTE — Progress Notes (Signed)
Clinical Summary [name] is a 63 y.o.male seen in the office earlier this month. He was referred for a diagnostic cardiac catheterization following abnormal Cardiolite and with symptoms suggestive of angina. He was found to have moderate CAD in the major epicardials with severe stenosis involving the mid circumflex/second obtuse marginal - underwent DES placement in that distribution on April 10 by Dr. Swazil[place]. He called back to the office reporting dark stools soon after discharge from the hospital. We elected to take him off of Brilinta and start Plavix in addition to his aspirin, also Protonix 40 mg daily. Fortunately, followup lab work shows normal hemoglobin 14.2 and platelets 249.  He comes in today, states that the stool changes have completely resolved. He has not noticed any frank bleeding, no hematochezia. Otherwise seems to be tolerating his medications, although he is concerned about the high dose Lipitor. Has many concerns about whether he will be able to tolerate this based on discussions with friends that have been on similar medications. He is active, as mentioned previously likes to cycle and swim.  No problems with his vascular access site in the right wrist.  Today we discussed his cardiac catheterization findings, plans to modify medical therapy, and observation.  No Known Allergies  Current Outpatient Prescriptions  Medication Sig Dispense Refill  . ascorbic acid (VITAMIN C) 1000 MG tablet Take 1,000 mg by mouth daily.      Marland Kitchen. aspirin 81 MG chewable tablet Chew 81 mg by mouth daily.       Marland Kitchen. atorvastatin (LIPITOR) 80 MG tablet Take 1 tablet (80 mg total) by mouth daily at 6 PM.  30 tablet  5  . clopidogrel (PLAVIX) 75 MG tablet Take 1 tablet (75 mg total) by mouth daily.  90 tablet  3  . Misc Natural Products (GLUCOS-CHONDROIT-MSM COMPLEX PO) Take 3 tablets by mouth every morning.       . nitroGLYCERIN (NITROSTAT) 0.4 MG SL tablet Place 1 tablet (0.4 mg total) under the  tongue every 5 (five) minutes as needed for chest pain.  25 tablet  12  . Omega-3 Krill Oil 300 MG CAPS Take 300 mg by mouth daily.      . pantoprazole (PROTONIX) 40 MG tablet Take 1 tablet (40 mg total) by mouth daily.  90 tablet  3  . propranolol (INDERAL) 10 MG tablet Take 5 mg by mouth 2 (two) times daily. Take 10 mg every morning and take 5 mg at noon      . Saw Palmetto 450 MG CAPS Take 450 mg by mouth daily.      . vitamin B-12 (CYANOCOBALAMIN) 500 MCG tablet Take 500 mcg by mouth daily.         No current facility-administered medications for this visit.    Past Medical History  Diagnosis Date  . Tremor     Right hand  . Hematochezia 2005  . Prostatitis   . Carpal tunnel syndrome   . Hypertension   . Coronary atherosclerosis of native coronary artery     DES mid LCx/OM2 12/2013  . Arthritis     Social History [name] reports that he quit smoking about 5 years ago. His smoking use included Cigarettes. He has a 18.5 pack-year smoking history. He has never used smokeless tobacco. [name] reports that he drinks alcohol.  Review of Systems Negative except as outlined.  Physical Examination Filed Vitals:   12/17/13 1314  BP: 141/77  Pulse: 50   Filed Weights  12/17/13 1314  Weight: 217 lb (98.431 kg)    The patient is comfortable at rest.  HEENT: Conjunctiva and lids normal, oropharynx clear.  Neck: Supple, no elevated JVP or carotid bruits, no thyromegaly.  Lungs: Clear to auscultation, nonlabored breathing at rest.  Cardiac: Regular rate and rhythm, no S3 or significant systolic murmur, no pericardial rub.  Abdomen: Soft, nontender, bowel sounds present, no guarding or rebound.  Extremities: No pitting edema, distal pulses 2+.  Skin: Warm and dry.  Musculoskeletal: No kyphosis. Well-healing right radial access site. Neuropsychiatric: Alert and oriented x3, affect grossly appropriate.   Problem List and Plan   Coronary atherosclerosis of native coronary  artery Status post DES to the mid circumflex/second obtuse marginal, otherwise medically managed residual disease as outlined in the catheterization report. Continue current regimen, he is tolerating aspirin and Plavix. We will reduce Lipitor to 40 mg every other day to see if he tolerates this, if so plan followup FLP and LFT for his followup visit. Gradually increase activities, I will see him back in 2 months.  Elevated blood pressure He is trying to lose some weight, restricting his diet more. We will keep an eye on this.    Jonelle SidleSamuel G. Ina Scrivens, M.D., F.A.C.C.

## 2013-12-17 NOTE — Assessment & Plan Note (Signed)
He is trying to lose some weight, restricting his diet more. We will keep an eye on this.

## 2013-12-24 ENCOUNTER — Telehealth: Payer: Self-pay | Admitting: Cardiology

## 2013-12-24 NOTE — Telephone Encounter (Signed)
Yes Gavin PoundDeborah, Echo needs to be done.

## 2013-12-24 NOTE — Telephone Encounter (Signed)
Yes, go ahead and complete the echocardiogram as already ordered.

## 2013-12-24 NOTE — Telephone Encounter (Signed)
Please advise if you would like an Echo done on this pt.

## 2013-12-24 NOTE — Telephone Encounter (Signed)
There is an order for an echo in their workqueque.  Does this need to be done? It was put in on 4/13 by Huey BienenstockBrian Hager the PA.  Patient was seen on 4/16 by Dr.McDowell but the echo was not addressed as if he needs it or not.  Would you verify this and contact Gavin Poundeborah at the [address]. / tgs

## 2013-12-28 ENCOUNTER — Ambulatory Visit (HOSPITAL_COMMUNITY)
Admission: RE | Admit: 2013-12-28 | Discharge: 2013-12-28 | Disposition: A | Discharge status: Home / Self Care | Payer: BC Managed Care – PPO | Source: Ambulatory Visit | Attending: Physician Assistant | Admitting: Physician Assistant

## 2013-12-28 DIAGNOSIS — I059 Rheumatic mitral valve disease, unspecified: Secondary | ICD-10-CM

## 2013-12-28 DIAGNOSIS — I2510 Atherosclerotic heart disease of native coronary artery without angina pectoris: Secondary | ICD-10-CM | POA: Insufficient documentation

## 2013-12-28 NOTE — Progress Notes (Signed)
*  PRELIMINARY RESULTS* Echocardiogram 2D Echocardiogram has been performed.  Renae FickleCynthia L Shauntae Reitman 12/28/2013, 9:25 AM

## 2014-01-11 ENCOUNTER — Encounter: Payer: BC Managed Care – PPO | Admitting: Cardiology

## 2014-01-11 ENCOUNTER — Telehealth: Payer: Self-pay | Admitting: Cardiology

## 2014-01-11 NOTE — Telephone Encounter (Signed)
LM x2 on cell,lm at home

## 2014-01-11 NOTE — Telephone Encounter (Signed)
Patient has questions regarding the Statin he is taking. When calling back please ask to speak with "Jeffery Goodwin" not Jomarie LongsJoseph. / tgs

## 2014-01-12 MED ORDER — PRAVASTATIN SODIUM 20 MG PO TABS: 20.0000 mg | ORAL_TABLET | ORAL | Status: DC

## 2014-01-12 NOTE — Telephone Encounter (Signed)
Pt has arm cramps on lipitor even at taking dose every other day.Is interested in another statin

## 2014-01-12 NOTE — Telephone Encounter (Signed)
We could try Pravachol 20 mg every other day if he would like.

## 2014-01-12 NOTE — Addendum Note (Signed)
Addended by: Marlyn CorporalARLTON, CATHERINE A on: 01/12/2014 09:48 AM   Modules accepted: Orders, Medications

## 2014-01-12 NOTE — Telephone Encounter (Signed)
LM at pts home number per pt request [phone]   rx cahnged to pravachol 20 mg qod escrib to pharmacy

## 2014-01-13 NOTE — Telephone Encounter (Signed)
Lefty message to call back again.

## 2014-01-30 LAB — LIPID PANEL
Cholesterol: 187 mg/dL (ref 0–200)
HDL: 59 mg/dL (ref >39)
LDL Cholesterol: 114 mg/dL — ABNORMAL HIGH (ref 0–99)
Total CHOL/HDL Ratio: 3.2 Ratio
Triglycerides: 68 mg/dL (ref <150)
VLDL: 14 mg/dL (ref 0–40)

## 2014-01-30 LAB — HEPATIC FUNCTION PANEL
ALT: 27 U/L (ref 0–53)
AST: 29 U/L (ref 0–37)
Albumin: 4.6 g/dL (ref 3.5–5.2)
Alkaline Phosphatase: 74 U/L (ref 39–117)
Bilirubin, Direct: 0.1 mg/dL (ref 0.0–0.3)
Indirect Bilirubin: 0.4 mg/dL (ref 0.2–1.2)
Total Bilirubin: 0.5 mg/dL (ref 0.2–1.2)
Total Protein: 6.5 g/dL (ref 6.0–8.3)

## 2014-02-04 ENCOUNTER — Encounter: Payer: Self-pay | Admitting: Cardiology

## 2014-02-04 NOTE — Progress Notes (Signed)
Clinical Summary [name] is a 63 y.o.male last seen in April of this year. He has since changed from Lipitor to Pravachol. He has been tolerating this better, still has occasional cramps. Continues to work in his [employer] business, although may be retiring soon. He continues to exercise, riding his bike regularly and swimming. He has lost 10 pounds through diet and exercise.  Recent lab work showed normal LFTs, cholesterol 187, triglycerides 68, HDL 59, LDL 114. We went over the results today.  Echocardiogram from April showed mildly dilated LV with LVEF 45-50%, grade 1 diastolic dysfunction, mild basal septal hypertrophy, mild mitral regurgitation, moderate left atrial enlargement.   No Known Allergies  Current Outpatient Prescriptions  Medication Sig Dispense Refill  . ascorbic acid (VITAMIN C) 1000 MG tablet Take 1,000 mg by mouth daily.      Marland Kitchen. aspirin 81 MG chewable tablet Chew 81 mg by mouth daily.       . clopidogrel (PLAVIX) 75 MG tablet Take 1 tablet (75 mg total) by mouth daily.  90 tablet  3  . Misc Natural Products (GLUCOS-CHONDROIT-MSM COMPLEX PO) Take 3 tablets by mouth every morning.       . nitroGLYCERIN (NITROSTAT) 0.4 MG SL tablet Place 1 tablet (0.4 mg total) under the tongue every 5 (five) minutes as needed for chest pain.  25 tablet  12  . Omega-3 Krill Oil 300 MG CAPS Take 300 mg by mouth daily.      . pantoprazole (PROTONIX) 40 MG tablet Take 1 tablet (40 mg total) by mouth daily.  90 tablet  3  . pravastatin (PRAVACHOL) 20 MG tablet Take 1 tablet (20 mg total) by mouth every other day.  90 tablet  3  . propranolol (INDERAL) 10 MG tablet Take 5 mg by mouth 2 (two) times daily. Take 10 mg every morning and take 5 mg at noon      . Saw Palmetto 450 MG CAPS Take 450 mg by mouth daily.      . vitamin B-12 (CYANOCOBALAMIN) 500 MCG tablet Take 500 mcg by mouth daily.         No current facility-administered medications for this visit.    Past Medical History    Diagnosis Date  . Tremor     Right hand  . Hematochezia 2005  . Prostatitis   . Carpal tunnel syndrome   . Hypertension   . Coronary atherosclerosis of native coronary artery     DES mid LCx/OM2 12/2013  . Arthritis     Social History [name] reports that he quit smoking about 5 years ago. His smoking use included Cigarettes. He has a 18.5 pack-year smoking history. He has never used smokeless tobacco. [name] reports that he drinks alcohol.  Review of Systems Arthritic pains, neuropathic cervical pain. Negative except as outlined.  Physical Examination Filed Vitals:   02/05/14 0836  BP: 118/68  Pulse: 50   Filed Weights   02/05/14 0836  Weight: 207 lb (93.895 kg)    The patient is comfortable at rest.  HEENT: Conjunctiva and lids normal, oropharynx clear.  Neck: Supple, no elevated JVP or carotid bruits, no thyromegaly.  Lungs: Clear to auscultation, nonlabored breathing at rest.  Cardiac: Regular rate and rhythm, no S3 or significant systolic murmur, no pericardial rub.  Abdomen: Soft, nontender, bowel sounds present, no guarding or rebound.  Extremities: No pitting edema, distal pulses 2+.  Skin: Warm and dry.  Musculoskeletal: No kyphosis. Well-healing right radial  access site.  Neuropsychiatric: Alert and oriented x3, affect grossly appropriate.   Problem List and Plan   Coronary atherosclerosis of native coronary artery Symptomatically stable status post DES to the mid circumflex/second obtuse marginal, otherwise medically managed residual disease as outlined in the catheterization report. Continue diet and exercise, no change to current medical regimen. Followup arranged in 3 months.   Elevated blood pressure Blood pressure is normal today.    Jonelle Sidle, M.D., F.A.C.C.

## 2014-02-05 ENCOUNTER — Encounter: Payer: Self-pay | Admitting: Cardiology

## 2014-02-05 ENCOUNTER — Ambulatory Visit (INDEPENDENT_AMBULATORY_CARE_PROVIDER_SITE_OTHER): Payer: Medicare HMO | Admitting: Cardiology

## 2014-02-05 VITALS — BP 118/68 | HR 50 | Ht 73.0 in | Wt 207.0 lb

## 2014-02-05 DIAGNOSIS — R030 Elevated blood-pressure reading, without diagnosis of hypertension: Secondary | ICD-10-CM

## 2014-02-05 DIAGNOSIS — IMO0001 Reserved for inherently not codable concepts without codable children: Secondary | ICD-10-CM

## 2014-02-05 DIAGNOSIS — E785 Hyperlipidemia, unspecified: Secondary | ICD-10-CM

## 2014-02-05 DIAGNOSIS — I2510 Atherosclerotic heart disease of native coronary artery without angina pectoris: Secondary | ICD-10-CM

## 2014-02-05 NOTE — Assessment & Plan Note (Signed)
Symptomatically stable status post DES to the mid circumflex/second obtuse marginal, otherwise medically managed residual disease as outlined in the catheterization report. Continue diet and exercise, no change to current medical regimen. Followup arranged in 3 months.

## 2014-02-05 NOTE — Assessment & Plan Note (Signed)
Blood pressure is normal today. 

## 2014-02-05 NOTE — Patient Instructions (Addendum)
Your physician recommends that you schedule a follow-up appointment in: 3 months    Please get blood work done one week before you see Dr.McDowell in 3 months (LFT'S,LIPIDS)    Your physician recommends that you continue on your current medications as directed. Please refer to the Current Medication list given to you today.     Thank you for choosing [facility] !

## 2014-02-23 ENCOUNTER — Encounter (HOSPITAL_COMMUNITY): Payer: BC Managed Care – PPO

## 2014-03-26 ENCOUNTER — Telehealth: Payer: Self-pay | Admitting: *Deleted

## 2014-03-26 NOTE — Telephone Encounter (Signed)
LMTCB

## 2014-03-26 NOTE — Telephone Encounter (Signed)
Would stop the Pravachol and make sure that symptoms improve. We could try yet another statin, but might be better to go with something else such as Niaspan or even Zetia. Have him call back in a few weeks to verify symptoms are better, otherwise consider the other options and we can go from there.

## 2014-03-26 NOTE — Telephone Encounter (Signed)
Pt states that pravastatin is still causing him some issues, his muscles still ache. He is still taking it but would like to talk about a possible change.

## 2014-03-26 NOTE — Telephone Encounter (Signed)
Will refer to Dr.McDowell

## 2014-03-30 NOTE — Telephone Encounter (Signed)
Pt will stop pravachol and call back in a few weeks

## 2014-04-05 ENCOUNTER — Telehealth: Payer: Self-pay | Admitting: *Deleted

## 2014-04-05 NOTE — Telephone Encounter (Signed)
Pt phone is busy

## 2014-04-05 NOTE — Telephone Encounter (Signed)
Is this topical or oral? In any event, NSAID's in patients with cardiovascular disease (he has known CAD, prior PCI, and mildly reduced LV systolic function) are associated with an increased risk of MI and stroke. This is a patient of Dr. Diona BrownerMcDowell and further recommendations will be left to him.

## 2014-04-05 NOTE — Telephone Encounter (Signed)
Pt is being put on voltaren for arthritis and want to make sure its okay with us to be on this medication .

## 2014-04-06 NOTE — Telephone Encounter (Signed)
I spoke with pt and relayed message below.He will call orthopedic MD to discuss

## 2014-04-15 ENCOUNTER — Telehealth: Payer: Self-pay

## 2014-04-15 NOTE — Telephone Encounter (Signed)
Pt stopped Pravachol due to muscle pain and is "100%" better.

## 2014-04-15 NOTE — Telephone Encounter (Signed)
Stay off Pravachol. We can talk more at his followup office visit about other options.

## 2014-04-20 ENCOUNTER — Telehealth: Payer: Self-pay | Admitting: *Deleted

## 2014-04-20 NOTE — Telephone Encounter (Signed)
Pt is scheduled to have mri in [place] and needs to know if he is able to have it done with the stent he has.

## 2014-04-20 NOTE — Telephone Encounter (Signed)
Per Dr.McDowell pt may have MRI.Pt states his paper work on stent he was given tells him which type/strenght MRI he can use which he plans to show MRI tech

## 2014-04-30 LAB — HEPATIC FUNCTION PANEL
ALT: 19 U/L (ref 0–53)
AST: 22 U/L (ref 0–37)
Albumin: 4.3 g/dL (ref 3.5–5.2)
Alkaline Phosphatase: 96 U/L (ref 39–117)
Bilirubin, Direct: 0.2 mg/dL (ref 0.0–0.3)
Indirect Bilirubin: 0.6 mg/dL (ref 0.2–1.2)
Total Bilirubin: 0.8 mg/dL (ref 0.2–1.2)
Total Protein: 6.6 g/dL (ref 6.0–8.3)

## 2014-04-30 LAB — LIPID PANEL
Cholesterol: 178 mg/dL (ref 0–200)
HDL: 59 mg/dL (ref >39)
LDL Cholesterol: 106 mg/dL — ABNORMAL HIGH (ref 0–99)
Total CHOL/HDL Ratio: 3 Ratio
Triglycerides: 65 mg/dL (ref <150)
VLDL: 13 mg/dL (ref 0–40)

## 2014-05-06 ENCOUNTER — Ambulatory Visit (INDEPENDENT_AMBULATORY_CARE_PROVIDER_SITE_OTHER): Payer: Medicare HMO | Admitting: Cardiology

## 2014-05-06 ENCOUNTER — Encounter: Payer: Self-pay | Admitting: Cardiology

## 2014-05-06 VITALS — BP 128/76 | HR 50 | Ht 73.0 in | Wt 199.0 lb

## 2014-05-06 DIAGNOSIS — I2510 Atherosclerotic heart disease of native coronary artery without angina pectoris: Secondary | ICD-10-CM

## 2014-05-06 DIAGNOSIS — Z862 Personal history of diseases of the blood and blood-forming organs and certain disorders involving the immune mechanism: Secondary | ICD-10-CM

## 2014-05-06 DIAGNOSIS — E785 Hyperlipidemia, unspecified: Secondary | ICD-10-CM

## 2014-05-06 DIAGNOSIS — Z8639 Personal history of other endocrine, nutritional and metabolic disease: Secondary | ICD-10-CM

## 2014-05-06 MED ORDER — NIACIN ER (ANTIHYPERLIPIDEMIC) 500 MG PO TBCR: 500.0000 mg | EXTENDED_RELEASE_TABLET | Freq: Every day | ORAL | Status: DC

## 2014-05-06 NOTE — Progress Notes (Signed)
Clinical Summary Jeffery Goodwin is a 63 y.o.male last seen in June. He has been doing well in terms of his cardiac status, no angina or breathlessness. He did have to back off on his exercise program due to arthritic pain in his hip. Still enjoys swimming and cycling. He is retired now.  He has tried both Lipitor and Pravachol. Pravachol was stopped since last visit due to muscle aching. He states that his symptoms completely resolved. Recent labwork from August 27 showed normal LFTs, cholesterol 178, triglycerides 65, HDL 59, and LDL 106. We discussed trying Niaspan. Otherwise continue diet and exercise.   No Known Allergies  Current Outpatient Prescriptions  Medication Sig Dispense Refill  . ascorbic acid (VITAMIN C) 1000 MG tablet Take 1,000 mg by mouth daily.      Marland Kitchen aspirin 81 MG chewable tablet Chew 81 mg by mouth daily.       . clopidogrel (PLAVIX) 75 MG tablet Take 1 tablet (75 mg total) by mouth daily.  90 tablet  3  . Misc Natural Products (GLUCOS-CHONDROIT-MSM COMPLEX PO) Take 3 tablets by mouth every morning.       . nitroGLYCERIN (NITROSTAT) 0.4 MG SL tablet Place 1 tablet (0.4 mg total) under the tongue every 5 (five) minutes as needed for chest pain.  25 tablet  12  . Omega-3 Krill Oil 300 MG CAPS Take 300 mg by mouth daily.      . pantoprazole (PROTONIX) 40 MG tablet Take 1 tablet (40 mg total) by mouth daily.  90 tablet  3  . propranolol (INDERAL) 10 MG tablet Take 5 mg by mouth 2 (two) times daily. Take 10 mg every morning and take 5 mg at noon      . Saw Palmetto 450 MG CAPS Take 450 mg by mouth daily.      . vitamin B-12 (CYANOCOBALAMIN) 500 MCG tablet Take 500 mcg by mouth daily.        . niacin (NIASPAN) 500 MG CR tablet Take 1 tablet (500 mg total) by mouth at bedtime.  30 tablet  9   No current facility-administered medications for this visit.    Past Medical History  Diagnosis Date  . Tremor     Right hand  . Hematochezia 2005  . Prostatitis   . Carpal tunnel  syndrome   . Hypertension   . Coronary atherosclerosis of native coronary artery     DES mid LCx/OM2 12/2013  . Arthritis     Social History Jeffery Goodwin reports that he quit smoking about 5 years ago. His smoking use included Cigarettes. He has a 18.5 pack-year smoking history. He has never used smokeless tobacco. Jeffery Goodwin reports that he drinks alcohol.  Review of Systems No palpitations, dizziness, syncope, claudication. Other systems reviewed and negative except as outlined.  Physical Examination Filed Vitals:   05/06/14 0901  BP: 128/76  Pulse: 50   Filed Weights   05/06/14 0901  Weight: 199 lb (90.266 kg)    The patient is comfortable at rest.  HEENT: Conjunctiva and lids normal, oropharynx clear.  Neck: Supple, no elevated JVP or carotid bruits, no thyromegaly.  Lungs: Clear to auscultation, nonlabored breathing at rest.  Cardiac: Regular rate and rhythm, no S3 or significant systolic murmur, no pericardial rub.  Abdomen: Soft, nontender, bowel sounds present, no guarding or rebound.  Extremities: No pitting edema, distal pulses 2+.  Skin: Warm and dry.  Musculoskeletal: No kyphosis. Well-healing right radial access site.  Neuropsychiatric: Alert  and oriented x3, affect grossly appropriate.   Problem List and Plan   Coronary atherosclerosis of native coronary artery Continue present regimen, diet and exercise.  History of elevated lipids We will try Niaspan 500 mg daily, preceded 30 minutes by aspirin to reduce flushing. If he tolerates this, recheck lipid panel for his next visit in 3 months.    Jonelle Sidle, M.D., F.A.C.C.

## 2014-05-06 NOTE — Assessment & Plan Note (Signed)
Continue present regimen, diet and exercise.

## 2014-05-06 NOTE — Patient Instructions (Signed)
Your physician recommends that you schedule a follow-up appointment in: 3 months    Your physician has recommended you make the following change in your medication:      START Niaspan 500 mg daily, take 30 minutes after taking aspirin to reduce flushing side effects   Please get blood work (LFT's, Lipid) one week before next visit     Thank you for choosing [facility] !

## 2014-05-06 NOTE — Assessment & Plan Note (Signed)
We will try Niaspan 500 mg daily, preceded 30 minutes by aspirin to reduce flushing. If he tolerates this, recheck lipid panel for his next visit in 3 months.

## 2014-05-12 ENCOUNTER — Telehealth: Payer: Self-pay | Admitting: *Deleted

## 2014-05-12 NOTE — Telephone Encounter (Signed)
Noted. We did discuss this at his recent visit. If he would like to hold off on Niaspan for now that is fine.

## 2014-05-12 NOTE — Telephone Encounter (Signed)
niacin ER  tablets  Pt has been dropping cholesterol with diet and exercise, he would like to noit take this medication if Dr Diona Browner feels that this is okay. He would like to try to keep dropping it by him self.

## 2014-05-12 NOTE — Telephone Encounter (Signed)
Will forward to MD for review

## 2014-05-12 NOTE — Telephone Encounter (Signed)
Pt said he would stop taking the Niaspan. Has f/u in December

## 2014-08-05 ENCOUNTER — Ambulatory Visit: Payer: Medicare HMO | Admitting: Cardiology

## 2014-08-12 ENCOUNTER — Encounter (HOSPITAL_COMMUNITY): Payer: Self-pay | Admitting: Cardiology

## 2014-09-13 ENCOUNTER — Encounter: Payer: Self-pay | Admitting: Cardiology

## 2014-09-13 ENCOUNTER — Ambulatory Visit (INDEPENDENT_AMBULATORY_CARE_PROVIDER_SITE_OTHER): Payer: Medicare HMO | Admitting: Cardiology

## 2014-09-13 VITALS — BP 120/76 | HR 53 | Ht 73.0 in | Wt 201.0 lb

## 2014-09-13 DIAGNOSIS — I2510 Atherosclerotic heart disease of native coronary artery without angina pectoris: Secondary | ICD-10-CM

## 2014-09-13 DIAGNOSIS — Z8639 Personal history of other endocrine, nutritional and metabolic disease: Secondary | ICD-10-CM

## 2014-09-13 NOTE — Assessment & Plan Note (Signed)
Doing very well symptomatically on medical therapy. No changes were made today. We discussed his diet and exercise plan, he has been doing very well. Follow-up arranged in April, around which time he will be able to come off Plavix.

## 2014-09-13 NOTE — Progress Notes (Signed)
Reason for visit: CAD, hyperlipidemia  Clinical Summary Mr. Zenaida Niecemos is a 64 y.o.male last seen in September 2015.  He comes in for a routine visit today. Doing very well. Still rides his bike regularly, usually 2 and a half hour rides. Also swimming an hour a day. He has no angina. He reports compliance with his medications. No nitroglycerin requirement. No unusual bleeding problems on aspirin and Plavix.  Labwork from December 2015 showed cholesterol 172, triglycerides 57, HDL 67, and LDL 94. He elected not to take Niaspan, and has prior statin intolerance.  He has made nice changes in his diet, has lost weight, and his showed consistent improvement in his lipid panl.  Allergies  Allergen Reactions  . Asa [Aspirin] Other (See Comments)    Stomach ache at 325 mg dose can take 81 mg dose    Current Outpatient Prescriptions  Medication Sig Dispense Refill  . ascorbic acid (VITAMIN C) 1000 MG tablet Take 1,000 mg by mouth daily.    Marland Kitchen. aspirin 81 MG chewable tablet Chew 81 mg by mouth daily.     . clopidogrel (PLAVIX) 75 MG tablet Take 1 tablet (75 mg total) by mouth daily. 90 tablet 3  . Misc Natural Products (GLUCOS-CHONDROIT-MSM COMPLEX PO) Take 3 tablets by mouth every morning.     . nitroGLYCERIN (NITROSTAT) 0.4 MG SL tablet Place 1 tablet (0.4 mg total) under the tongue every 5 (five) minutes as needed for chest pain. 25 tablet 12  . Omega-3 Krill Oil 300 MG CAPS Take 300 mg by mouth daily.    . pantoprazole (PROTONIX) 40 MG tablet Take 1 tablet (40 mg total) by mouth daily. 90 tablet 3  . propranolol (INDERAL) 10 MG tablet Take 5 mg by mouth 2 (two) times daily. Take 10 mg every morning and take 5 mg at noon    . Saw Palmetto 450 MG CAPS Take 450 mg by mouth daily.    . vitamin B-12 (CYANOCOBALAMIN) 500 MCG tablet Take 500 mcg by mouth daily.       No current facility-administered medications for this visit.    Past Medical History  Diagnosis Date  . Tremor     Right hand  .  Hematochezia 2005  . Prostatitis   . Carpal tunnel syndrome   . Hypertension   . Coronary atherosclerosis of native coronary artery     DES mid LCx/OM2 12/2013  . Arthritis     Social History Mr. Zenaida Niecemos reports that he quit smoking about 6 years ago. His smoking use included Cigarettes. He has a 18.5 pack-year smoking history. He has never used smokeless tobacco. Mr. Zenaida Niecemos reports that he drinks alcohol.  Review of Systems Complete review of systems negative except as otherwise outlined in the clinical summary and also the following. Has a right-sided inguinal hernia. Some nerve impingement in his cervical area.  Physical Examination Filed Vitals:   09/13/14 1542  BP: 120/76  Pulse: 53   Filed Weights   09/13/14 1542  Weight: 201 lb (91.173 kg)    The patient is comfortable at rest.  HEENT: Conjunctiva and lids normal, oropharynx clear.  Neck: Supple, no elevated JVP or carotid bruits, no thyromegaly.  Lungs: Clear to auscultation, nonlabored breathing at rest.  Cardiac: Regular rate and rhythm, no S3 or significant systolic murmur, no pericardial rub.  Abdomen: Soft, nontender, bowel sounds present, no guarding or rebound.  Extremities: No pitting edema, distal pulses 2+.    Problem List and Plan  Coronary atherosclerosis of native coronary artery Doing very well symptomatically on medical therapy. No changes were made today. We discussed his diet and exercise plan, he has been doing very well. Follow-up arranged in April, around which time he will be able to come off Plavix.  History of elevated lipids Managing with diet and exercise. LDL is down to 94. HDL at 67.    Jonelle Sidle, M.D., F.A.C.C.

## 2014-09-13 NOTE — Assessment & Plan Note (Signed)
Managing with diet and exercise. LDL is down to 94. HDL at 67.

## 2014-09-13 NOTE — Patient Instructions (Signed)
Your physician recommends that you schedule a follow-up appointment in: April with Dr.McDowell     Your physician recommends that you continue on your current medications as directed. Please refer to the Current Medication list given to you today.      Thank you for choosing [facility] !

## 2014-11-03 ENCOUNTER — Telehealth: Payer: Self-pay | Admitting: Cardiology

## 2014-11-03 NOTE — Telephone Encounter (Signed)
Pt wife made aware that no lab work need to be done prior to April appt. Pt had labs in December 2015. Last office note addresses lab results from December.

## 2014-11-03 NOTE — Telephone Encounter (Signed)
Patient wants to know if he needs lab work prior to appointment in April / tgs

## 2014-11-17 ENCOUNTER — Other Ambulatory Visit: Payer: Self-pay | Admitting: Neurosurgery

## 2014-12-07 ENCOUNTER — Other Ambulatory Visit: Payer: Self-pay | Admitting: Cardiology

## 2014-12-07 ENCOUNTER — Telehealth: Payer: Self-pay | Admitting: Cardiology

## 2014-12-07 MED ORDER — PANTOPRAZOLE SODIUM 40 MG PO TBEC: 40.0000 mg | DELAYED_RELEASE_TABLET | Freq: Every day | ORAL | Status: DC

## 2014-12-07 NOTE — Telephone Encounter (Signed)
[name] needs to know if he is to continue taking  clopidogrel (PLAVIX) 75 MG tablet  Medication     Please change his pharmacy to [facility], [place], [place]  [address][phone] Needs refill on Pantoprazole 40 mg

## 2014-12-07 NOTE — Telephone Encounter (Signed)
Received fax refill request  Rx # [id number] Medication:  Pantoprazole Sod 40mg  Tab Qty 90 Sig:  Take one tablet by mouth once daily Physician:  Nona DellMcDowell, Samuel

## 2014-12-07 NOTE — Telephone Encounter (Signed)
His DES intervention was on 12/11/2013, so he can plan on stopping his Plavix when he has completed this month's bottle, no more refills needed.

## 2014-12-07 NOTE — Telephone Encounter (Signed)
Received fax refill request  Rx # [id number] Medication:  Clopidogrel 75mg  Tab Qty 90 Sig:  Take one tablet by mouth twice daily Physician:  Nona DellMcDowell, Samuel

## 2014-12-07 NOTE — Telephone Encounter (Signed)
Per last office note,pt may come off Plavix in April,will verify with Dr Diona BrownerMcDowell

## 2014-12-07 NOTE — Telephone Encounter (Signed)
Pt.notified

## 2014-12-08 NOTE — Telephone Encounter (Signed)
Pt pantoprazole refilled 12/07/14. Pt is to stop plavix as of April.

## 2014-12-10 ENCOUNTER — Encounter (HOSPITAL_COMMUNITY): Payer: Self-pay

## 2014-12-10 ENCOUNTER — Encounter: Payer: Self-pay | Admitting: Cardiology

## 2014-12-10 ENCOUNTER — Other Ambulatory Visit: Payer: Self-pay

## 2014-12-10 ENCOUNTER — Encounter (HOSPITAL_COMMUNITY)
Admission: RE | Admit: 2014-12-10 | Discharge: 2014-12-10 | Disposition: A | Discharge status: Home / Self Care | Payer: Medicare HMO | Source: Ambulatory Visit | Attending: Neurosurgery | Admitting: Neurosurgery

## 2014-12-10 ENCOUNTER — Ambulatory Visit (INDEPENDENT_AMBULATORY_CARE_PROVIDER_SITE_OTHER): Payer: Medicare HMO | Admitting: Cardiology

## 2014-12-10 VITALS — BP 128/84 | HR 47 | Ht 73.0 in | Wt 202.8 lb

## 2014-12-10 DIAGNOSIS — Z87891 Personal history of nicotine dependence: Secondary | ICD-10-CM | POA: Insufficient documentation

## 2014-12-10 DIAGNOSIS — K219 Gastro-esophageal reflux disease without esophagitis: Secondary | ICD-10-CM | POA: Diagnosis not present

## 2014-12-10 DIAGNOSIS — Z01812 Encounter for preprocedural laboratory examination: Secondary | ICD-10-CM | POA: Insufficient documentation

## 2014-12-10 DIAGNOSIS — M4802 Spinal stenosis, cervical region: Secondary | ICD-10-CM | POA: Diagnosis not present

## 2014-12-10 DIAGNOSIS — R001 Bradycardia, unspecified: Secondary | ICD-10-CM

## 2014-12-10 DIAGNOSIS — E785 Hyperlipidemia, unspecified: Secondary | ICD-10-CM | POA: Insufficient documentation

## 2014-12-10 DIAGNOSIS — Z01818 Encounter for other preprocedural examination: Secondary | ICD-10-CM | POA: Diagnosis present

## 2014-12-10 DIAGNOSIS — Z01810 Encounter for preprocedural cardiovascular examination: Secondary | ICD-10-CM

## 2014-12-10 DIAGNOSIS — I2510 Atherosclerotic heart disease of native coronary artery without angina pectoris: Secondary | ICD-10-CM | POA: Diagnosis not present

## 2014-12-10 HISTORY — DX: Hyperlipidemia, unspecified: E78.5

## 2014-12-10 HISTORY — DX: Cervicalgia: M54.2

## 2014-12-10 HISTORY — DX: Nocturia: R35.1

## 2014-12-10 HISTORY — DX: Personal history of other infectious and parasitic diseases: Z86.19

## 2014-12-10 HISTORY — DX: Dry eye syndrome of bilateral lacrimal glands: H04.123

## 2014-12-10 HISTORY — DX: Gastro-esophageal reflux disease without esophagitis: K21.9

## 2014-12-10 LAB — BASIC METABOLIC PANEL
Anion gap: 6 (ref 5–15)
BUN: 19 mg/dL (ref 6–23)
CO2: 28 mmol/L (ref 19–32)
Calcium: 9.5 mg/dL (ref 8.4–10.5)
Chloride: 105 mmol/L (ref 96–112)
Creatinine, Ser: 0.98 mg/dL (ref 0.50–1.35)
GFR calc Af Amer: >90 mL/min (ref >90)
GFR calc non Af Amer: 84 mL/min — ABNORMAL LOW (ref >90)
Glucose, Bld: 81 mg/dL (ref 70–99)
Potassium: 4.7 mmol/L (ref 3.5–5.1)
Sodium: 139 mmol/L (ref 135–145)

## 2014-12-10 LAB — CBC WITH DIFFERENTIAL/PLATELET
Basophils Absolute: 0 10*3/uL (ref 0.0–0.1)
Basophils Relative: 1 % (ref 0–1)
Eosinophils Absolute: 0.3 10*3/uL (ref 0.0–0.7)
Eosinophils Relative: 9 % — ABNORMAL HIGH (ref 0–5)
HCT: 42.7 % (ref 39.0–52.0)
Hemoglobin: 14.5 g/dL (ref 13.0–17.0)
Lymphocytes Relative: 28 % (ref 12–46)
Lymphs Abs: 1.1 10*3/uL (ref 0.7–4.0)
MCH: 31.2 pg (ref 26.0–34.0)
MCHC: 34 g/dL (ref 30.0–36.0)
MCV: 91.8 fL (ref 78.0–100.0)
Monocytes Absolute: 0.4 10*3/uL (ref 0.1–1.0)
Monocytes Relative: 10 % (ref 3–12)
Neutro Abs: 2 10*3/uL (ref 1.7–7.7)
Neutrophils Relative %: 52 % (ref 43–77)
Platelets: 180 10*3/uL (ref 150–400)
RBC: 4.65 MIL/uL (ref 4.22–5.81)
RDW: 12.9 % (ref 11.5–15.5)
WBC: 3.8 10*3/uL — ABNORMAL LOW (ref 4.0–10.5)

## 2014-12-10 LAB — SURGICAL PCR SCREEN
MRSA, PCR: NEGATIVE
Staphylococcus aureus: NEGATIVE

## 2014-12-10 NOTE — Progress Notes (Signed)
Cardiology Office Note  Date: 12/10/2014   ID: Jeffery Goodwin, DOB 12/13/1950, MRN [id number]  PCP: Fredirick MaudlinHAWKINS,EDWARD L, MD  Primary Cardiologist: Nona DellSamuel McDowell, MD   Chief Complaint  Patient presents with  . Preoperative evaluation  . Coronary Artery Disease    History of Present Illness: Jeffery Goodwin is a 64 y.o. male last seen in January at which time he was doing well clinically. He comes to the office today for follow-up with plans to undergo cervical fusion on April 18 with Dr. Jordan LikesPool. He states that he continues to do very well, swims for an hour every day of the week except Sunday when he goes on a three-hour bike ride on hilly terrain. He does not endorse any angina symptoms or limiting shortness of breath.  We reviewed his medications, he is now a year out from his DES intervention to the circumflex and obtuse marginal system, we are stopping Plavix at this point.  I reviewed his ECG done earlier today. He was significantly bradycardic, however has a history of sinus bradycardia dating back for years, is asymptomatic without dizziness or syncope. He is also on low-dose Inderal for tremors which likely holds down his heart rate somewhat as well.   Past Medical History  Diagnosis Date  . Tremor     Right hand  . Carpal tunnel syndrome   . Arthritis   . GERD (gastroesophageal reflux disease)   . Dry eyes   . Coronary atherosclerosis of native coronary artery     DES mid LCx/OM2 12/2013  . Hyperlipidemia   . Neck pain   . Nocturia   . History of shingles     Past Surgical History  Procedure Laterality Date  . Arthroscopic left knee surgery  2005/2013  . Left inguinal herniorrhaphy  2008  . Carpal tunnel repair Right 1998  . Left heart catheterization with coronary angiogram N/A 12/11/2013    Procedure: LEFT HEART CATHETERIZATION WITH CORONARY ANGIOGRAM;  Surgeon: Peter M SwazilandJordan, MD;  [facility];  Service: Cardiovascular;  Laterality: N/A;  . Colonoscopy       Current Outpatient Prescriptions  Medication Sig Dispense Refill  . ascorbic acid (VITAMIN C) 1000 MG tablet Take 1,000 mg by mouth daily.    Marland Kitchen. aspirin 81 MG chewable tablet Chew 81 mg by mouth daily.     Marland Kitchen. CINNAMON PO Take 1 each by mouth daily. Take 1 spoonful with yogurt daily    . COCONUT OIL PO Take 1 each by mouth daily. Take 1 spoonful by mouth daily    . Flaxseed, Linseed, (FLAX SEEDS PO) Take 1 each by mouth daily. Take 1 spoonful with yogurt daily    . Misc Natural Products (GLUCOS-CHONDROIT-MSM COMPLEX PO) Take 3 tablets by mouth every morning.     . nitroGLYCERIN (NITROSTAT) 0.4 MG SL tablet Place 1 tablet (0.4 mg total) under the tongue every 5 (five) minutes as needed for chest pain. 25 tablet 12  . Omega-3 Fatty Acids (FISH OIL) 1200 MG CAPS Take 3,600 mg by mouth daily.    . pantoprazole (PROTONIX) 40 MG tablet Take 1 tablet (40 mg total) by mouth daily. 90 tablet 3  . propranolol (INDERAL) 10 MG tablet Take 5 mg by mouth 2 (two) times daily.     . Saw Palmetto 450 MG CAPS Take 450 mg by mouth daily.    . Tetrahydrozoline HCl (VISINE OP) Place 1 drop into both eyes 2 (two) times daily.    .Marland Kitchen  vitamin B-12 (CYANOCOBALAMIN) 500 MCG tablet Take 2,500 mcg by mouth daily.      No current facility-administered medications for this visit.    Allergies: Prior intolerance to Asa (tolerating low-dose)  Social History: The patient  reports that he has quit smoking. His smoking use included Cigarettes. He smoked 0.00 packs per day for 0 years. He has never used smokeless tobacco. He reports that he drinks alcohol. He reports that he does not use illicit drugs.    ROS:  Please see the history of present illness. Otherwise, complete review of systems is positive for limited range of motion of his neck.  All other systems are reviewed and negative.   Physical Exam: VS:  BP 128/84 mmHg  Pulse 47  Ht  (1.854 m)  Wt 202 lb 12.8 oz (91.989 kg)  BMI 26.76 kg/m2  SpO2 96%, BMI  Body mass index is 26.76 kg/(m^2).  Wt Readings from Last 3 Encounters:  12/10/14 202 lb 12.8 oz (91.989 kg)  09/13/14 201 lb (91.173 kg)  05/06/14 199 lb (90.266 kg)     Appears comfortable at rest. HEENT: Conjunctiva and lids normal, oropharynx clear.  Neck: Supple, no elevated JVP or carotid bruits, no thyromegaly.  Lungs: Clear to auscultation, nonlabored breathing at rest.  Cardiac: Regular rate and rhythm, no S3 or significant systolic murmur, no pericardial rub.  Abdomen: Soft, nontender, bowel sounds present, no guarding or rebound.  Extremities: No pitting edema, distal pulses 2+.   ECG: Tracing from 12/10/2014 showed sinus bradycardia at 38 bpm with increased voltage and repolarization changes. Tracing from April of last year showed sinus bradycardia at 46 bpm.   Recent Labwork:  08/13/2014: Hemoglobin 14.1, platelets 192, potassium 4.5, BUN 21, creatinine 1.0, AST 28, ALT 21, cholesterol 172, triglycerides 57, HDL 67, LDL 94, TSH 2.2.   Other Studies Reviewed Today:  Echocardiogram 12/28/2013:  Study Conclusions  - Procedure narrative: Transthoracic echocardiography. Image quality was suboptimal. The study was technically difficult, as a result of poor sound wave transmission. - Left ventricle: The cavity size was mildly dilated. Systolic function was low normal to mildly reduced. The estimated ejection fraction was in the range of 45% to 50%. Mild global hypokinesis. There was an increased relative contribution of atrial contraction to ventricular filling. Doppler parameters are consistent with abnormal left ventricular relaxation (grade 1 diastolic dysfunction). Mild basal septal hypertrophy. - Mitral valve: Mild regurgitation. - Left atrium: The atrium was moderately dilated.   Assessment and Plan:  1. Symptomatically stable with CAD status post DES to the circumflex/obtuse marginal system in April 2015. We are now stopping Plavix.  Otherwise continue present regimen.  2. Preoperative cardiac evaluation prior to elective cervical fusion on April 18 with Dr. Jordan Likes. Patient is doing very well from a cardiac perspective, stopping Plavix, may also hold aspirin perioperatively. He does not require any further cardiac testing at this time and should be able to proceed at an acceptable cardiac risk.  3. Sinus bradycardia, asymptomatic.  4. Lipid status looking better. He has a history of statin intolerance, LDL now down under 100 with dietary adjustments including no red meat intake.  Current medicines were reviewed with the patient today.  Disposition: FU with me in 6 months.   Signed, Jonelle Sidle, MD, [facility] 12/10/2014 2:41 PM    [facility] at [facility] [address] [place], [place], [place] [address] Phone: [phone]; Fax: [phone]

## 2014-12-10 NOTE — Progress Notes (Addendum)
Cardiologist is Dr.McDowell with last visit in epic from 2015  Echo report in epic from 2010/2015  Stress test report in epic from 2015  Heart cath report in epic from 2015  Medical Md [place]  To see Dr.McDowell today for clearance  Last EKG in epic from 12-12-13  Denies CXR in past yr

## 2014-12-10 NOTE — Progress Notes (Signed)
Anesthesia Chart Review:  Pt is 64 year old male scheduled for C3-4, C4-5, C5-6 ACDF on 12/20/2014 with Dr. Jordan LikesPool.   Cardiologist is Dr. Diona BrownerMcDowell. PCP is Dr. Juanetta GoslingHawkins in [place].   PMH includes: CAD, hyperlipidemia, GERD. Former smoker. BMI 27.   Preoperative labs reviewed.    EKG: Marked sinus bradycardia (38 bpm). Minimal voltage criteria for LVH, may be normal variant. Nonspecific ST abnormality.   Echo 12/28/2013:  - Procedure narrative: Transthoracic echocardiography. Image quality was suboptimal. The study was technically difficult, as a result of poor sound wave transmission. - Left ventricle: The cavity size was mildly dilated. Systolic function was low normal to mildly reduced. The estimated ejection fraction was in the range of 45% to 50%. Mild global hypokinesis. There was an increasedrelative contribution of atrial contraction to ventricular filling. Doppler parameters are consistent with abnormal left ventricular relaxation (grade 1 diastolic dysfunction). Mild basal septal hypertrophy. - Mitral valve: Mild regurgitation. - Left atrium: The atrium was moderately dilated.  Cardiac cath 12/11/2013, performed for abnormal stress test: 1. Single vessel obstructive CAD. Moderate plaque in all 3 vessels. 2. Mild LV dysfunction.  3. Successful stenting of the mid LCx/OM2 with a DES  Pt has cardiac clearance from Dr. Diona BrownerMcDowell in Epic note dated 12/10/2014.   If no changes, I anticipate pt can proceed with surgery as scheduled.   Rica Mastngela Kabbe, FNP-BC [facility]/Anesthesiology Phone: [phone] 12/10/2014 3:04 PM

## 2014-12-10 NOTE — Patient Instructions (Signed)
   Remain off of the Plavix Continue all other medications.   Fasting lipid panel just prior to next office visit. Your physician wants you to follow up in: 6 months.  You will receive a reminder letter in the mail one-two months in advance.  If you don't receive a letter, please call our office to schedule the follow up appointment

## 2014-12-10 NOTE — Pre-Procedure Instructions (Signed)
Erskine SquibbJoseph W Knoke  12/10/2014   Your procedure is scheduled on:  Mon, April 18 @ 11:30 AM  [facility]  at 9:30 AM.  Call this number if you have problems the morning of surgery: [phone]   Remember:   Do not eat food or drink liquids after midnight.   Take these medicines the morning of surgery with A SIP OF WATER: Pantoprazole(Protonix),Propranolol(Inderal),and Eye Drops               Stop taking your Aspirin,Fish Oil,Vitamins,and any Herbal Medications a week before surgery. No Goody's,BC's,or Aleve.   Do not wear jewelry.  Do not wear lotions, powders, or colognes. You may wear deodorant.   Men may shave face and neck.  Do not bring valuables to the hospital.  [facility] is not responsible                  for any belongings or valuables.               Contacts, dentures or bridgework may not be worn into surgery.  Leave suitcase in the car. After surgery it may be brought to your room.  For patients admitted to the hospital, discharge time is determined by your                treatment team.                Special Instructions:  [place] - Preparing for Surgery  Before surgery, you can play an important role.  Because skin is not sterile, your skin needs to be as free of germs as possible.  You can reduce the number of germs on you skin by washing with CHG (chlorahexidine gluconate) soap before surgery.  CHG is an antiseptic cleaner which kills germs and bonds with the skin to continue killing germs even after washing.  Please DO NOT use if you have an allergy to CHG or antibacterial soaps.  If your skin becomes reddened/irritated stop using the CHG and inform your nurse when you arrive at Short Stay.  Do not shave (including legs and underarms) for at least 48 hours prior to the first CHG shower.  You may shave your face.  Please follow these instructions carefully:   1.  Shower with CHG Soap the night before surgery and the                                 morning of Surgery.  2.  If you choose to wash your hair, wash your hair first as usual with your       normal shampoo.  3.  After you shampoo, rinse your hair and body thoroughly to remove the                      Shampoo.  4.  Use CHG as you would any other liquid soap.  You can apply chg directly       to the skin and wash gently with scrungie or a clean washcloth.  5.  Apply the CHG Soap to your body ONLY FROM THE NECK DOWN.        Do not use on open wounds or open sores.  Avoid contact with your eyes,       ears, mouth and genitals (private parts).  Wash genitals (private parts)       with  your normal soap.  6.  Wash thoroughly, paying special attention to the area where your surgery        will be performed.  7.  Thoroughly rinse your body with warm water from the neck down.  8.  DO NOT shower/wash with your normal soap after using and rinsing off       the CHG Soap.  9.  Pat yourself dry with a clean towel.            10.  Wear clean pajamas.            11.  Place clean sheets on your bed the night of your first shower and do not        sleep with pets.  Day of Surgery  Do not apply any lotions/deoderants the morning of surgery.  Please wear clean clothes to the hospital/surgery center.    Please read over the following fact sheets that you were given: Pain Booklet, Coughing and Deep Breathing, MRSA Information and Surgical Site Infection Prevention

## 2014-12-19 MED ORDER — DEXAMETHASONE SODIUM PHOSPHATE 10 MG/ML IJ SOLN
10.0000 mg | INTRAMUSCULAR | Status: AC
  Administered 2014-12-20: 10 mg via INTRAVENOUS
  Filled 2014-12-19: qty 1

## 2014-12-19 MED ORDER — CEFAZOLIN SODIUM-DEXTROSE 2-3 GM-% IV SOLR
2.0000 g | INTRAVENOUS | Status: AC
  Administered 2014-12-20: 2 g via INTRAVENOUS
  Filled 2014-12-19: qty 50

## 2014-12-20 ENCOUNTER — Encounter (HOSPITAL_COMMUNITY)
Admission: RE | Disposition: A | Discharge status: Home / Self Care | Payer: Self-pay | Source: Ambulatory Visit | Attending: Neurosurgery

## 2014-12-20 ENCOUNTER — Encounter (HOSPITAL_COMMUNITY): Payer: Self-pay | Admitting: *Deleted

## 2014-12-20 ENCOUNTER — Inpatient Hospital Stay (HOSPITAL_COMMUNITY)
Admission: RE | Admit: 2014-12-20 | Discharge: 2014-12-21 | DRG: 473 | Disposition: A | Discharge status: Home / Self Care | Payer: Medicare HMO | Source: Ambulatory Visit | Attending: Neurosurgery | Admitting: Neurosurgery

## 2014-12-20 ENCOUNTER — Ambulatory Visit (HOSPITAL_COMMUNITY): Payer: Medicare HMO

## 2014-12-20 ENCOUNTER — Ambulatory Visit (HOSPITAL_COMMUNITY): Payer: Medicare HMO | Admitting: Emergency Medicine

## 2014-12-20 ENCOUNTER — Ambulatory Visit (HOSPITAL_COMMUNITY): Payer: Medicare HMO | Admitting: Certified Registered Nurse Anesthetist

## 2014-12-20 DIAGNOSIS — E785 Hyperlipidemia, unspecified: Secondary | ICD-10-CM | POA: Diagnosis present

## 2014-12-20 DIAGNOSIS — I2510 Atherosclerotic heart disease of native coronary artery without angina pectoris: Secondary | ICD-10-CM | POA: Diagnosis present

## 2014-12-20 DIAGNOSIS — M4322 Fusion of spine, cervical region: Secondary | ICD-10-CM

## 2014-12-20 DIAGNOSIS — Z7982 Long term (current) use of aspirin: Secondary | ICD-10-CM

## 2014-12-20 DIAGNOSIS — Z87891 Personal history of nicotine dependence: Secondary | ICD-10-CM | POA: Diagnosis not present

## 2014-12-20 DIAGNOSIS — K219 Gastro-esophageal reflux disease without esophagitis: Secondary | ICD-10-CM | POA: Diagnosis present

## 2014-12-20 DIAGNOSIS — Z955 Presence of coronary angioplasty implant and graft: Secondary | ICD-10-CM

## 2014-12-20 DIAGNOSIS — M5020 Other cervical disc displacement, unspecified cervical region: Secondary | ICD-10-CM | POA: Diagnosis present

## 2014-12-20 DIAGNOSIS — M4802 Spinal stenosis, cervical region: Secondary | ICD-10-CM | POA: Diagnosis present

## 2014-12-20 DIAGNOSIS — M5001 Cervical disc disorder with myelopathy,  high cervical region: Secondary | ICD-10-CM | POA: Diagnosis present

## 2014-12-20 DIAGNOSIS — Z8619 Personal history of other infectious and parasitic diseases: Secondary | ICD-10-CM | POA: Diagnosis not present

## 2014-12-20 DIAGNOSIS — M542 Cervicalgia: Secondary | ICD-10-CM | POA: Diagnosis present

## 2014-12-20 HISTORY — PX: ANTERIOR CERVICAL DECOMP/DISCECTOMY FUSION: SHX1161

## 2014-12-20 SURGERY — ANTERIOR CERVICAL DECOMPRESSION/DISCECTOMY FUSION 3 LEVELS
Anesthesia: General | Site: Neck

## 2014-12-20 MED ORDER — SODIUM CHLORIDE 0.9 % IJ SOLN
3.0000 mL | Freq: Two times a day (BID) | INTRAMUSCULAR | Status: DC
  Administered 2014-12-20: 3 mL via INTRAVENOUS

## 2014-12-20 MED ORDER — GLYCOPYRROLATE 0.2 MG/ML IJ SOLN
INTRAMUSCULAR | Status: DC | PRN
  Administered 2014-12-20: 0.6 mg via INTRAVENOUS

## 2014-12-20 MED ORDER — HYDROCODONE-ACETAMINOPHEN 5-325 MG PO TABS: 1.0000 | ORAL_TABLET | ORAL | Status: DC | PRN

## 2014-12-20 MED ORDER — SODIUM CHLORIDE 0.9 % IJ SOLN: 3.0000 mL | INTRAMUSCULAR | Status: DC | PRN

## 2014-12-20 MED ORDER — VITAMIN B-12 500 MCG PO TABS
2500.0000 ug | ORAL_TABLET | Freq: Every day | ORAL | Status: DC
  Filled 2014-12-20: qty 5

## 2014-12-20 MED ORDER — LIDOCAINE HCL (CARDIAC) 20 MG/ML IV SOLN
INTRAVENOUS | Status: DC | PRN
  Administered 2014-12-20: 50 mg via INTRAVENOUS

## 2014-12-20 MED ORDER — LACTATED RINGERS IV SOLN
INTRAVENOUS | Status: DC
  Administered 2014-12-20: 10:00:00 via INTRAVENOUS

## 2014-12-20 MED ORDER — ONDANSETRON HCL 4 MG/2ML IJ SOLN
4.0000 mg | INTRAMUSCULAR | Status: DC | PRN
Start: 2014-12-20 — End: 2014-12-21

## 2014-12-20 MED ORDER — FENTANYL CITRATE (PF) 100 MCG/2ML IJ SOLN
INTRAMUSCULAR | Status: DC | PRN
  Administered 2014-12-20 (×2): 50 ug via INTRAVENOUS
  Administered 2014-12-20 (×2): 100 ug via INTRAVENOUS

## 2014-12-20 MED ORDER — NITROGLYCERIN 0.4 MG SL SUBL
0.4000 mg | SUBLINGUAL_TABLET | SUBLINGUAL | Status: DC | PRN
Start: 2014-12-20 — End: 2014-12-21

## 2014-12-20 MED ORDER — PROMETHAZINE HCL 25 MG/ML IJ SOLN: 6.2500 mg | INTRAMUSCULAR | Status: DC | PRN

## 2014-12-20 MED ORDER — OXYCODONE HCL 5 MG/5ML PO SOLN: 5.0000 mg | Freq: Once | ORAL | Status: DC | PRN

## 2014-12-20 MED ORDER — PHENOL 1.4 % MT LIQD
1.0000 | OROMUCOSAL | Status: DC | PRN
  Administered 2014-12-20: 1 via OROMUCOSAL
  Filled 2014-12-20: qty 177

## 2014-12-20 MED ORDER — GELATIN ABSORBABLE 100 EX MISC
CUTANEOUS | Status: DC | PRN
  Administered 2014-12-20: 20 mL via TOPICAL

## 2014-12-20 MED ORDER — ROCURONIUM BROMIDE 100 MG/10ML IV SOLN
INTRAVENOUS | Status: DC | PRN
  Administered 2014-12-20: 50 mg via INTRAVENOUS
  Administered 2014-12-20 (×2): 10 mg via INTRAVENOUS

## 2014-12-20 MED ORDER — 0.9 % SODIUM CHLORIDE (POUR BTL) OPTIME
TOPICAL | Status: DC | PRN
  Administered 2014-12-20: 1000 mL

## 2014-12-20 MED ORDER — MENTHOL 3 MG MT LOZG
1.0000 | LOZENGE | OROMUCOSAL | Status: DC | PRN
  Administered 2014-12-20: 3 mg via ORAL
  Filled 2014-12-20: qty 9

## 2014-12-20 MED ORDER — HYDROMORPHONE HCL 1 MG/ML IJ SOLN: 0.5000 mg | INTRAMUSCULAR | Status: DC | PRN

## 2014-12-20 MED ORDER — THROMBIN 5000 UNITS EX SOLR
OROMUCOSAL | Status: DC | PRN
  Administered 2014-12-20: 10 mL via TOPICAL

## 2014-12-20 MED ORDER — LACTATED RINGERS IV SOLN
INTRAVENOUS | Status: DC | PRN
  Administered 2014-12-20 (×3): via INTRAVENOUS

## 2014-12-20 MED ORDER — ONDANSETRON HCL 4 MG/2ML IJ SOLN
INTRAMUSCULAR | Status: DC | PRN
  Administered 2014-12-20: 4 mg via INTRAVENOUS

## 2014-12-20 MED ORDER — ONDANSETRON HCL 4 MG/2ML IJ SOLN
INTRAMUSCULAR | Status: AC
  Filled 2014-12-20: qty 2

## 2014-12-20 MED ORDER — SODIUM CHLORIDE 0.9 % IR SOLN
Status: DC | PRN
  Administered 2014-12-20: 500 mL

## 2014-12-20 MED ORDER — ASPIRIN 81 MG PO CHEW
81.0000 mg | CHEWABLE_TABLET | Freq: Every day | ORAL | Status: DC
  Filled 2014-12-20: qty 1

## 2014-12-20 MED ORDER — PROPRANOLOL HCL 10 MG PO TABS
5.0000 mg | ORAL_TABLET | Freq: Two times a day (BID) | ORAL | Status: DC
  Filled 2014-12-20 (×3): qty 0.5

## 2014-12-20 MED ORDER — TETRAHYDROZOLINE HCL 0.05 % OP SOLN
1.0000 [drp] | Freq: Two times a day (BID) | OPHTHALMIC | Status: DC
  Filled 2014-12-20: qty 15

## 2014-12-20 MED ORDER — OXYCODONE HCL 5 MG PO TABS: 5.0000 mg | ORAL_TABLET | Freq: Once | ORAL | Status: DC | PRN

## 2014-12-20 MED ORDER — MIDAZOLAM HCL 2 MG/2ML IJ SOLN
INTRAMUSCULAR | Status: AC
  Filled 2014-12-20: qty 2

## 2014-12-20 MED ORDER — CEFAZOLIN SODIUM 1-5 GM-% IV SOLN
1.0000 g | Freq: Three times a day (TID) | INTRAVENOUS | Status: AC
  Administered 2014-12-20 – 2014-12-21 (×2): 1 g via INTRAVENOUS
  Filled 2014-12-20 (×2): qty 50

## 2014-12-20 MED ORDER — HYDROMORPHONE HCL 1 MG/ML IJ SOLN
INTRAMUSCULAR | Status: AC
  Filled 2014-12-20: qty 1

## 2014-12-20 MED ORDER — ACETAMINOPHEN 650 MG RE SUPP
650.0000 mg | RECTAL | Status: DC | PRN
Start: 2014-12-20 — End: 2014-12-21

## 2014-12-20 MED ORDER — ACETAMINOPHEN 325 MG PO TABS: 650.0000 mg | ORAL_TABLET | ORAL | Status: DC | PRN

## 2014-12-20 MED ORDER — NEOSTIGMINE METHYLSULFATE 10 MG/10ML IV SOLN
INTRAVENOUS | Status: AC
  Filled 2014-12-20: qty 1

## 2014-12-20 MED ORDER — FENTANYL CITRATE (PF) 250 MCG/5ML IJ SOLN
INTRAMUSCULAR | Status: AC
  Filled 2014-12-20: qty 5

## 2014-12-20 MED ORDER — HYDROMORPHONE HCL 1 MG/ML IJ SOLN
0.2500 mg | INTRAMUSCULAR | Status: DC | PRN
  Administered 2014-12-20 (×4): 0.5 mg via INTRAVENOUS

## 2014-12-20 MED ORDER — CYCLOBENZAPRINE HCL 10 MG PO TABS
ORAL_TABLET | ORAL | Status: AC
  Filled 2014-12-20: qty 1

## 2014-12-20 MED ORDER — MIDAZOLAM HCL 5 MG/5ML IJ SOLN
INTRAMUSCULAR | Status: DC | PRN
  Administered 2014-12-20: 2 mg via INTRAVENOUS

## 2014-12-20 MED ORDER — LIDOCAINE HCL (CARDIAC) 20 MG/ML IV SOLN
INTRAVENOUS | Status: AC
  Filled 2014-12-20: qty 5

## 2014-12-20 MED ORDER — CYCLOBENZAPRINE HCL 10 MG PO TABS
10.0000 mg | ORAL_TABLET | Freq: Three times a day (TID) | ORAL | Status: DC | PRN
  Administered 2014-12-20 (×2): 10 mg via ORAL
  Filled 2014-12-20 (×2): qty 1

## 2014-12-20 MED ORDER — PROPOFOL 10 MG/ML IV BOLUS
INTRAVENOUS | Status: AC
  Filled 2014-12-20: qty 20

## 2014-12-20 MED ORDER — OXYCODONE-ACETAMINOPHEN 5-325 MG PO TABS
1.0000 | ORAL_TABLET | ORAL | Status: DC | PRN
  Administered 2014-12-20 (×2): 2 via ORAL
  Filled 2014-12-20 (×2): qty 2

## 2014-12-20 MED ORDER — PROPOFOL 10 MG/ML IV BOLUS
INTRAVENOUS | Status: DC | PRN
  Administered 2014-12-20: 150 mg via INTRAVENOUS

## 2014-12-20 MED ORDER — PANTOPRAZOLE SODIUM 40 MG PO TBEC: 40.0000 mg | DELAYED_RELEASE_TABLET | Freq: Every day | ORAL | Status: DC

## 2014-12-20 MED ORDER — ROCURONIUM BROMIDE 50 MG/5ML IV SOLN
INTRAVENOUS | Status: AC
  Filled 2014-12-20: qty 1

## 2014-12-20 MED ORDER — SODIUM CHLORIDE 0.9 % IV SOLN: 250.0000 mL | INTRAVENOUS | Status: DC

## 2014-12-20 MED ORDER — NEOSTIGMINE METHYLSULFATE 10 MG/10ML IV SOLN
INTRAVENOUS | Status: DC | PRN
Start: 2014-12-20 — End: 2014-12-20
  Administered 2014-12-20: 4 mg via INTRAVENOUS

## 2014-12-20 MED ORDER — EPHEDRINE SULFATE 50 MG/ML IJ SOLN
INTRAMUSCULAR | Status: DC | PRN
  Administered 2014-12-20: 10 mg via INTRAVENOUS
  Administered 2014-12-20: 15 mg via INTRAVENOUS

## 2014-12-20 SURGICAL SUPPLY — 55 items
BAG DECANTER FOR FLEXI CONT (MISCELLANEOUS) ×2 IMPLANT
BENZOIN TINCTURE PRP APPL 2/3 (GAUZE/BANDAGES/DRESSINGS) ×2 IMPLANT
BRUSH SCRUB EZ PLAIN DRY (MISCELLANEOUS) ×2 IMPLANT
BUR MATCHSTICK NEURO 3.0 LAGG (BURR) ×2 IMPLANT
CAGE PEEK 6X14X11 (Cage) ×3 IMPLANT
CAGE SPNL 11X14X6XRADOPQ (Cage) ×3 IMPLANT
CANISTER SUCT 3000ML PPV (MISCELLANEOUS) ×2 IMPLANT
CONT SPEC 4OZ CLIKSEAL STRL BL (MISCELLANEOUS) ×2 IMPLANT
DERMABOND ADHESIVE PROPEN (GAUZE/BANDAGES/DRESSINGS) ×1
DERMABOND ADVANCED .7 DNX6 (GAUZE/BANDAGES/DRESSINGS) ×1 IMPLANT
DRAPE C-ARM 42X72 X-RAY (DRAPES) ×4 IMPLANT
DRAPE LAPAROTOMY 100X72 PEDS (DRAPES) ×2 IMPLANT
DRAPE MICROSCOPE LEICA (MISCELLANEOUS) ×2 IMPLANT
DRAPE POUCH INSTRU U-SHP 10X18 (DRAPES) ×2 IMPLANT
DRILL BIT (BIT) ×2 IMPLANT
DRSG OPSITE POSTOP 4X6 (GAUZE/BANDAGES/DRESSINGS) ×2 IMPLANT
DURAPREP 6ML APPLICATOR 50/CS (WOUND CARE) ×2 IMPLANT
ELECT COATED BLADE 2.86 ST (ELECTRODE) ×2 IMPLANT
ELECT REM PT RETURN 9FT ADLT (ELECTROSURGICAL) ×2
ELECTRODE REM PT RTRN 9FT ADLT (ELECTROSURGICAL) ×1 IMPLANT
GAUZE SPONGE 4X4 12PLY STRL (GAUZE/BANDAGES/DRESSINGS) ×2 IMPLANT
GAUZE SPONGE 4X4 16PLY XRAY LF (GAUZE/BANDAGES/DRESSINGS) IMPLANT
GLOVE ECLIPSE 9.0 STRL (GLOVE) ×2 IMPLANT
GLOVE EXAM NITRILE LRG STRL (GLOVE) IMPLANT
GLOVE EXAM NITRILE MD LF STRL (GLOVE) IMPLANT
GLOVE EXAM NITRILE XL STR (GLOVE) IMPLANT
GLOVE EXAM NITRILE XS STR PU (GLOVE) IMPLANT
GOWN STRL REUS W/ TWL LRG LVL3 (GOWN DISPOSABLE) IMPLANT
GOWN STRL REUS W/ TWL XL LVL3 (GOWN DISPOSABLE) IMPLANT
GOWN STRL REUS W/TWL 2XL LVL3 (GOWN DISPOSABLE) IMPLANT
GOWN STRL REUS W/TWL LRG LVL3 (GOWN DISPOSABLE)
GOWN STRL REUS W/TWL XL LVL3 (GOWN DISPOSABLE)
HALTER HD/CHIN CERV TRACTION D (MISCELLANEOUS) ×2 IMPLANT
KIT BASIN OR (CUSTOM PROCEDURE TRAY) ×2 IMPLANT
KIT ROOM TURNOVER OR (KITS) ×2 IMPLANT
NEEDLE SPNL 20GX3.5 QUINCKE YW (NEEDLE) ×2 IMPLANT
NS IRRIG 1000ML POUR BTL (IV SOLUTION) ×2 IMPLANT
PACK LAMINECTOMY NEURO (CUSTOM PROCEDURE TRAY) ×2 IMPLANT
PAD ARMBOARD 7.5X6 YLW CONV (MISCELLANEOUS) ×6 IMPLANT
PLATE 3 60XNS SPNE CVD ANT T (Plate) ×1 IMPLANT
PLATE 3 ATLANTIS TRANS (Plate) ×1 IMPLANT
RUBBERBAND STERILE (MISCELLANEOUS) ×4 IMPLANT
SCREW ST FIX 4 ATL 3120213 (Screw) ×16 IMPLANT
SPONGE INTESTINAL PEANUT (DISPOSABLE) ×2 IMPLANT
SPONGE SURGIFOAM ABS GEL 100 (HEMOSTASIS) ×2 IMPLANT
STRIP CLOSURE SKIN 1/2X4 (GAUZE/BANDAGES/DRESSINGS) ×2 IMPLANT
SUT PDS AB 5-0 P3 18 (SUTURE) ×2 IMPLANT
SUT VIC AB 3-0 SH 8-18 (SUTURE) ×2 IMPLANT
SYR 20ML ECCENTRIC (SYRINGE) ×2 IMPLANT
TAPE CLOTH 4X10 WHT NS (GAUZE/BANDAGES/DRESSINGS) ×2 IMPLANT
TAPE STRIPS DRAPE STRL (GAUZE/BANDAGES/DRESSINGS) ×2 IMPLANT
TOWEL OR 17X24 6PK STRL BLUE (TOWEL DISPOSABLE) ×2 IMPLANT
TOWEL OR 17X26 10 PK STRL BLUE (TOWEL DISPOSABLE) ×2 IMPLANT
TRAP SPECIMEN MUCOUS 40CC (MISCELLANEOUS) ×2 IMPLANT
WATER STERILE IRR 1000ML POUR (IV SOLUTION) ×2 IMPLANT

## 2014-12-20 NOTE — Brief Op Note (Signed)
12/20/2014  12:58 PM  PATIENT:  Jeffery Goodwin  64 y.o. male  PRE-OPERATIVE DIAGNOSIS:  Stenosis  POST-OPERATIVE DIAGNOSIS:  stenosis  PROCEDURE:  Procedure(s) with comments: ANTERIOR CERVICAL DECOMPRESSION/DISCECTOMY FUSION 3 LEVELS (N/A) - ANTERIOR CERVICAL DECOMPRESSION/DISCECTOMY FUSION 3 LEVELS C3-4,4-5,5-6  SURGEON:  Surgeon(s) and Role:    * Julio SicksHenry Baptiste Littler, MD - Primary    * Tressie StalkerJeffrey Jenkins, MD - Assisting  PHYSICIAN ASSISTANT:   ASSISTANTS:    ANESTHESIA:   none  EBL:  Total I/O In: 2000 [I.V.:2000] Out: 450 [Urine:200; Blood:250]  BLOOD ADMINISTERED:none  DRAINS: none   LOCAL MEDICATIONS USED:  NONE  SPECIMEN:  No Specimen  DISPOSITION OF SPECIMEN:  N/A  COUNTS:  YES  TOURNIQUET:  * No tourniquets in log *  DICTATION: .Dragon Dictation  PLAN OF CARE: Admit to inpatient   PATIENT DISPOSITION:  PACU - hemodynamically stable.   Delay start of Pharmacological VTE agent (>24hrs) due to surgical blood loss or risk of bleeding: yes

## 2014-12-20 NOTE — Anesthesia Preprocedure Evaluation (Signed)
Anesthesia Evaluation  Patient identified by MRN, date of birth, ID band Patient awake    Reviewed: Allergy & Precautions, NPO status , Patient's Chart, lab work & pertinent test results  Airway Mallampati: I  TM Distance: >3 FB Neck ROM: Full    Dental  (+) Teeth Intact   Pulmonary former smoker,  breath sounds clear to auscultation        Cardiovascular + CAD Rhythm:Regular Rate:Normal     Neuro/Psych    GI/Hepatic GERD-  ,  Endo/Other    Renal/GU      Musculoskeletal   Abdominal   Peds  Hematology   Anesthesia Other Findings   Reproductive/Obstetrics                             Anesthesia Physical Anesthesia Plan  ASA: III  Anesthesia Plan: General   Post-op Pain Management:    Induction:   Airway Management Planned: Oral ETT  Additional Equipment:   Intra-op Plan:   Post-operative Plan: Extubation in OR  Informed Consent: I have reviewed the patients History and Physical, chart, labs and discussed the procedure including the risks, benefits and alternatives for the proposed anesthesia with the patient or authorized representative who has indicated his/her understanding and acceptance.   Dental advisory given  Plan Discussed with:   Anesthesia Plan Comments:         Anesthesia Quick Evaluation

## 2014-12-20 NOTE — Anesthesia Procedure Notes (Signed)
Procedure Name: Intubation Performed by: Kizzie FantasiaARVER, Max Romano J Pre-anesthesia Checklist: Patient identified, Patient being monitored, Timeout performed, Emergency Drugs available and Suction available Patient Re-evaluated:Patient Re-evaluated prior to inductionOxygen Delivery Method: Circle system utilized Preoxygenation: Pre-oxygenation with 100% oxygen Ventilation: Mask ventilation without difficulty Laryngoscope Size: Mac and 3 Grade View: Grade I Tube type: Oral Tube size: 7.5 mm Number of attempts: 1 Airway Equipment and Method: Stylet Placement Confirmation: ETT inserted through vocal cords under direct vision,  breath sounds checked- equal and bilateral,  positive ETCO2 and CO2 detector Secured at: 23 cm Tube secured with: Tape Dental Injury: Teeth and Oropharynx as per pre-operative assessment

## 2014-12-20 NOTE — H&P (Signed)
Jeffery Goodwin is an 64 y.o. male.   Chief Complaint: Neck and bilateral upper extremity symptoms HPI: 65 year old male with neck pain with radiation to both upper extremities consistent with a progressive cervical myelopathy. Workup demonstrates evidence of severe stenosis at C3-4 with spondylosis and stenosis at C4-5 and C5-6. Patient presents now for three-level anterior cervical decompression and fusion in hopes of improving his symptoms.  Past Medical History  Diagnosis Date  . Tremor     Right hand  . Carpal tunnel syndrome   . Arthritis   . GERD (gastroesophageal reflux disease)   . Dry eyes   . Coronary atherosclerosis of native coronary artery     DES mid LCx/OM2 12/2013  . Hyperlipidemia   . Neck pain   . Nocturia   . History of shingles     Past Surgical History  Procedure Laterality Date  . Arthroscopic left knee surgery  2005/2013  . Left inguinal herniorrhaphy  2008  . Carpal tunnel repair Right 1998  . Left heart catheterization with coronary angiogram N/A 12/11/2013    Procedure: LEFT HEART CATHETERIZATION WITH CORONARY ANGIOGRAM;  Surgeon: [name], MD;  Location: [facility] CATH LAB;  Service: Cardiovascular;  Laterality: N/A;  . Colonoscopy      Family History  Problem Relation Age of Onset  . Colon cancer Mother   . Brain cancer Father    Social History:  reports that he has quit smoking. His smoking use included Cigarettes. He smoked 0.00 packs per day for 0 years. He has never used smokeless tobacco. He reports that he drinks alcohol. He reports that he does not use illicit drugs.  Allergies:  Allergies  Allergen Reactions  . Asa [Aspirin] Other (See Comments)    Stomach ache at 325 mg dose can take 81 mg dose    Medications Prior to Admission  Medication Sig Dispense Refill  . ascorbic acid (VITAMIN C) 1000 MG tablet Take 1,000 mg by mouth daily.    Marland Kitchen aspirin 81 MG chewable tablet Chew 81 mg by mouth daily.     Marland Kitchen CINNAMON PO Take 1 each by mouth  daily. Take 1 spoonful with yogurt daily    . COCONUT OIL PO Take 1 each by mouth daily. Take 1 spoonful by mouth daily    . Flaxseed, Linseed, (FLAX SEEDS PO) Take 1 each by mouth daily. Take 1 spoonful with yogurt daily    . Misc Natural Products (GLUCOS-CHONDROIT-MSM COMPLEX PO) Take 3 tablets by mouth every morning.     . nitroGLYCERIN (NITROSTAT) 0.4 MG SL tablet Place 1 tablet (0.4 mg total) under the tongue every 5 (five) minutes as needed for chest pain. 25 tablet 12  . Omega-3 Fatty Acids (FISH OIL) 1200 MG CAPS Take 3,600 mg by mouth daily.    . pantoprazole (PROTONIX) 40 MG tablet Take 1 tablet (40 mg total) by mouth daily. 90 tablet 3  . propranolol (INDERAL) 10 MG tablet Take 5 mg by mouth 2 (two) times daily.     . Saw Palmetto 450 MG CAPS Take 450 mg by mouth daily.    . Tetrahydrozoline HCl (VISINE OP) Place 1 drop into both eyes 2 (two) times daily.    . vitamin B-12 (CYANOCOBALAMIN) 500 MCG tablet Take 2,500 mcg by mouth daily.       No results found for this or any previous visit (from the past 48 hour(s)). No results found.  Review of Systems  Constitutional: Negative.   HENT: Negative.  Eyes: Negative.   Respiratory: Negative.   Cardiovascular: Negative.   Gastrointestinal: Negative.   Genitourinary: Negative.   Musculoskeletal: Negative.   Skin: Negative.   Neurological: Negative.   Endo/Heme/Allergies: Negative.   Psychiatric/Behavioral: Negative.     Blood pressure 157/81, pulse 47, temperature 98.1 F (36.7 C), temperature source Oral, resp. rate 20, weight 91.627 kg (202 lb), SpO2 100 %. Physical Exam  Constitutional: He is oriented to person, place, and time. He appears well-developed and well-nourished. No distress.  HENT:  Head: Normocephalic and atraumatic.  Right Ear: External ear normal.  Left Ear: External ear normal.  Nose: Nose normal.  Mouth/Throat: Oropharynx is clear and moist. No oropharyngeal exudate.  Eyes: Conjunctivae and EOM are  normal. Pupils are equal, round, and reactive to light. Right eye exhibits no discharge. Left eye exhibits no discharge.  Neck: Normal range of motion. Neck supple. No tracheal deviation present. No thyromegaly present.  Cardiovascular: Normal rate, regular rhythm, normal heart sounds and intact distal pulses.  Exam reveals no friction rub.   No murmur heard. Respiratory: Effort normal and breath sounds normal. No respiratory distress. He has no wheezes.  GI: Soft. Bowel sounds are normal. He exhibits no distension. There is no tenderness.  Musculoskeletal: Normal range of motion. He exhibits no edema or tenderness.  Neurological: He is alert and oriented to person, place, and time. He displays normal reflexes. No cranial nerve deficit. He exhibits normal muscle tone. Coordination normal.  Skin: Skin is warm and dry. No rash noted. He is not diaphoretic. No erythema. No pallor.  Psychiatric: He has a normal mood and affect. His behavior is normal. Judgment and thought content normal.     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 Plan C3-4, C4-5, C5-6 anterior cervical discectomy with interbody fusion utilizing interbody peek cage, locally harvested autograft, and anterior plate instrumentation. Risks and benefits of been explained. Patient wishes to proceed.  Azie Mcconahy A 12/20/2014, 9:37 AM

## 2014-12-20 NOTE — Transfer of Care (Signed)
Immediate Anesthesia Transfer of Care Note  Patient: Erskine SquibbJoseph W Leatherbury  Procedure(s) Performed: Procedure(s) with comments: ANTERIOR CERVICAL DECOMPRESSION/DISCECTOMY FUSION 3 LEVELS (N/A) - ANTERIOR CERVICAL DECOMPRESSION/DISCECTOMY FUSION 3 LEVELS C3-4,4-5,5-6  Patient Location: PACU  Anesthesia Type:General  Level of Consciousness: awake, alert  and oriented  Airway & Oxygen Therapy: Patient Spontanous Breathing and Patient connected to nasal cannula oxygen  Post-op Assessment: Report given to RN and Post -op Vital signs reviewed and stable  Post vital signs: Reviewed and stable  Last Vitals:  Filed Vitals:   12/20/14 0935  BP: 157/81  Pulse: 47  Temp: 36.7 C  Resp: 20    Complications: No apparent anesthesia complications

## 2014-12-20 NOTE — Anesthesia Postprocedure Evaluation (Signed)
  Anesthesia Post-op Note  Patient: Erskine SquibbJoseph W Biffle  Procedure(s) Performed: Procedure(s) with comments: ANTERIOR CERVICAL DECOMPRESSION/DISCECTOMY FUSION 3 LEVELS (N/A) - ANTERIOR CERVICAL DECOMPRESSION/DISCECTOMY FUSION 3 LEVELS C3-4,4-5,5-6  Patient Location: PACU  Anesthesia Type:General  Level of Consciousness: awake and alert   Airway and Oxygen Therapy: Patient Spontanous Breathing  Post-op Pain: mild  Post-op Assessment: Post-op Vital signs reviewed  Post-op Vital Signs: stable  Last Vitals:  Filed Vitals:   12/20/14 1441  BP: 115/71  Pulse: 49  Temp:   Resp: 13    Complications: No apparent anesthesia complications

## 2014-12-20 NOTE — Op Note (Signed)
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  Asst. Surgeon: Lovell SheehanJenkins  Anesthesia: General  Indication: 64 year old male with neck and bilateral upper extremity symptoms consistent with a progressive cervical myelopathy. Workup demonstrates evidence of marked stenosis with signal change at C3-4. Patient has moderate spondylosis with stenosis at C4-5 and C5-6. Patient presents now for three-level anterior cervical decompression and fusion in hopes of improving his symptoms.  Operative note: After induction of anesthesia, patient positioned supine with neck slightly extended and held in place with halter traction. Anterior cervical region prepped and draped sterilely. Incision made overlying C4-5. Dissection proceeds down to the platysma area and platysma Lucretia RoersWood was divided vertically and dissection proceeds along the medial border of the sternocleidomastoid muscle and carotid sheath. Trachea and esophagus mobilized and retracted towards the left. Prevertebral fascia stripped off the anterior spinal column. Longus cole muscles elevated bilaterally. Deep self-retaining traction placed intraoperative fluoroscopy used levels were confirmed. Discectomies performed at L3 levels using pituitary rongeurs forward and backward L Cronk curettes Kerrison rongeurs the high-speed drill. All elements the disc removed down to level posterior annulus. Starting first at C3-4 microscope brought into the field. Using high-speed drill remaining aspects of annulus and osteophytes removed down to the level of the posterior longitudinal ligament. Posterior logical was elevated and resected  in piecemeal fashion. Underlying thecal sac was identified. A wide central decompression was then performed by undercutting the bodies of C3 and C4. Decompression then proceeded into each neural foramen. Wide anterior cervical foraminotomies were performed on the course the exiting C4 nerve root. At this point a very thorough decompression had been achieved. There was no evidence of injury to the thecal sac or nerve roots. Procedure then repeated at C4-5 and C5-6 again without complications. Wound is then irrigated with and bike solution. 6 mm interbody peek cages using the [employer] anatomic peek cages were packed with morselized autograft and each cage was impacted into position and recessed slightly from the anterior cortical margin. Atlantis translational anterior cervical plate was then placed over the C3 C4 C5 and C6 levels. This is an attachment or fluoroscopic guidance using 13 mm fixed angle screws. All 8 screws given a final tightening found to be solidly within the bone. Locking screws and gauge at all 4 levels. Final images revealed good position the bone graft and hardware at proper upper level with normal alignment of the spine. Wounds and irrigated one final time. Hemostasis was assured with bipolar chart. Wounds and closed in a typical fashion. Steri-Strips and sterile dressing were applied. There were no apparent complications. Patient tolerated the procedure well and he returns to the recovery room postop.

## 2014-12-21 MED ORDER — CYCLOBENZAPRINE HCL 10 MG PO TABS: 10.0000 mg | ORAL_TABLET | Freq: Three times a day (TID) | ORAL | Status: DC | PRN

## 2014-12-21 MED ORDER — HYDROCODONE-ACETAMINOPHEN 5-325 MG PO TABS: 1.0000 | ORAL_TABLET | ORAL | Status: DC | PRN

## 2014-12-21 NOTE — Progress Notes (Signed)
Utilization review completed.  

## 2014-12-21 NOTE — Discharge Instructions (Signed)
Wound Care °Keep incision area dry.  °You may remove outer bandage after 2 days and shower.  ° If you shower prior cover incision with plastic wrap.  °Do not put any creams, lotions, or ointments on incision. °Leave steri-strips on neck.  They will fall off by themselves. °Activity °Walk each and every day, increasing distance each day. °No lifting greater than 5 lbs.  Avoid excessive neck motion. °No driving for 2 weeks; may ride as a passenger locally. °Wear neck brace at all times except when showering. °Diet °Resume your normal diet.  °Return to Work °Will be discussed at you follow up appointment. °Call Your Doctor If Any of These Occur °Redness, drainage, or swelling at the wound.  °Temperature greater than 101 degrees. °Severe pain not relieved by pain medication. °Increased difficulty swallowing.  °Incision starts to come apart. °Follow Up Appt °Call today and ask for Rebecca for appointment in 1-2 weeks ([phone]) or for problems.  If you have any hardware placed in your spine, you will need an x-ray before your appointment. ° ° ° ° ° °Anterior Cervical Diskectomy and Fusion °Anterior cervical diskectomy is surgery done on the upper spine to relieve pressure on one or more nerve roots, or on the spinal cord. There are 7 bones in your neck, called the cervical spine. These 7 bones (vertebrae) sit one on top of the other. Cushions (intervertebral disks) separate the vertebrae and act like shock absorbers. As we age, degeneration of our bones, joints, and disks can cause neck pain and tightening around the spinal cord and nerve roots. This causes arm pain and weakness.  °Degeneration involves: °· Herniated Disk. With age, the disks dry up and can rupture. In this condition, the center of the disk bulges out (disk herniation). This can cause pressure on a nerve, which produces pain or weakness in the arm. °· Bone spurs and spinal stenosis. As we age, growths often develop on our bones. These growths are called  bone spurs (osteophytes). A bone spur is a collection of calcium. As bone spurs grow and extend, the vertebral openings become narrow. The spinal canal and/or the foramen (opening for nerve passageways) become smaller. This narrowing (stenosis) may cause pinching (compression) of the spinal cord or the spinal nerve root. The nerve injury can cause pain, weakness, numbness, and loss of coordination in the upper limbs. Often, patients have difficulty with their hand writing or they start dropping things, because their hand grip is weaker. The spinal cord damage can cause increased stiffness, more frequent falls, electric shooting pain, and changes in bowel and bladder control. °Degeneration in the neck results in three common problems: °· Radiculopathy - Nerve compression that results in weakness or pain that radiates down the arm. °· Myelopathy - Spinal cord compression that causes stiffness, difficulty with walking, coordination, and trouble with bowel or bladder habits. °· Neck pain - Worn out joints cause pain as the neck moves. °Treatment: °· Radiculopathy - Surgery is performed to remove the bony and disk material that is pushing on the nerve. °· Myelopathy - Surgery is performed to remove the bony and disk material that pushes on the spinal cord. °· Neck pain - Surgery is performed to combine (fuse) the joints of the neck together, so they cannot move or cause pain. °Surgery can be done from the front or the back of the neck. When it is done from the front, it is called an anterior (front) cervical (neck) diskectomy (removal of the disk) and fusion. °  LET YOUR CAREGIVER KNOW ABOUT:  °· Recent infections. °· Any shooting pains down your leg, when you move your neck. °· Any difficulty swallowing. °· A smoking history. °· Use of blood thinners or anti-inflammatory medicines. °· Any history of injury to your shoulders. °· Any history of injury to your vocal cords. °· Any foreign objects in your body from a previous  surgery. °· Any recent fevers or illness. °· Past medical history (diabetes, strokes). °· Past problems with anesthetics. °· Possibility of pregnancy. °· History of blood clots (deep vein thrombosis). °· History of bleeding or blood problems. °· Past surgeries. °· Other health problems. °· Allergies. °· Medicines you take, including herbs, eye drops, over-the-counter medicines, and creams. °· Use of steroids (by mouth or creams). °RISKS AND COMPLICATIONS °· Infection. °· Bleeding. °· Injury to the following structures: °¨ Carotid artery. This can result in a stroke or significant amount of bleeding. °¨ Esophagus, resulting in difficulty swallowing. °¨ Recurrent laryngeal nerve, resulting in hoarseness of the voice. °¨ Spinal cord injury, ranging from mild to complete quadriparesis (muscle weakness in all four limbs). °¨ Nerve root injury, resulting in muscle weakness in the upper limb. °¨ Leakage of cerebrospinal fluid. °BEFORE THE PROCEDURE  °· You will be given medicine to help you sleep (general anesthetic), and a breathing tube will be placed. °· You will be given antibiotics to keep the infection rate down. °· The incision site on your neck will be marked. °· Your neck will be cleaned, to reduce the risk of infection. °PROCEDURE  °An anterior cervical fusion means that the operation is done through the front (anterior) part of your neck. The cut made by the surgeon (incision) is usually within a skin fold line on the neck. After pushing aside the neck muscles, the surgeon removes the affected, degenerated disk and bone spurs (osteophytes), which takes the pressure off the nerves and spinal cord. This is called a decompression. The area where the disk was removed is then filled with a small piece of plastic. This plastic takes the place of the disk and keeps the nerve passageway (foramen) open and clear for the nerves. In most cases, the surgeon uses metal plates or pins (hardware) in the neck, to help stabilize  the level being fused. The hardware reduces motion at that level, so it can fuse. This provides extra support to the neck. A cervical fusion procedure takes anywhere from a couple to several hours, depending on the size of the neck, history of previous surgery, and number of levels being fused. °AFTER THE PROCEDURE  °· You will likely spend 24-48 hours in the hospital. During this time, your caregivers will look for any signs of complications from the procedure. °· Your caregiver will watch you, to make sure that fluid draining from the surgery slows down. It is important that a large mass of blood does not form in your neck, which would cause difficulty with breathing. °· You will get 24 hours of antibiotics. °· You can start to eat as soon as you feel comfortable. °· Once you have started eating, walking, urinating (voiding) and having bowel movements on your own, your caregiver will discharge you home. °HOME CARE INSTRUCTIONS  °· For 2 weeks, do not soak the incision site under water. Do not swim or take baths. Showers are okay, but rinse off the incision sites. °· Do not over exert yourself. Allow time for the incision to heal. °· It can take from 6 weeks to 6   months for fusion to take effect. Your caregiver may ask you to wear a neck collar during this time, as they check the fusion with multiple (serial) X-rays. °Document Released: 08/08/2009 Document Revised: 12/15/2012 Document Reviewed: 08/08/2009 °ExitCare® Patient Information ©2015 [employer]. This information is not intended to replace advice given to you by your health care provider. Make sure you discuss any questions you have with your health care provider. ° °

## 2014-12-21 NOTE — Discharge Summary (Signed)
Physician Discharge Summary  Patient ID: Jeffery SquibbJoseph W Leasure MRN: [id number] DOB/AGE: 66/03/1951 66 y.o.  Admit date: 12/20/2014 Discharge date: 12/21/2014  Admission Diagnoses:  Discharge Diagnoses:  Principal Problem:   Spinal stenosis in cervical region Active Problems:   Cervical herniated disc   Discharged Condition: good  Hospital Course: The patient was admitted to the hospital where he underwent an uncomplicated three-level anterior cervical decompression and fusion. Postoperatively he is doing very well. Preoperative neck and upper extremity symptoms are resolved. Strength and sensation are intact. Ambulating without difficulty. Ready for discharge home.  Consults:   Significant Diagnostic Studies:   Treatments:   Discharge Exam: Blood pressure 104/67, pulse 48, temperature 98.3 F (36.8 C), temperature source Oral, resp. rate 18, weight 91.627 kg (202 lb), SpO2 100 %. Awake and alert. Oriented and appropriate. Motor and sensory function intact. Wound clean and dry. Chest and abdomen benign.  Disposition: 01-Home or Self Care     Medication List    TAKE these medications        ascorbic acid 1000 MG tablet  Commonly known as:  VITAMIN C  Take 1,000 mg by mouth daily.     aspirin 81 MG chewable tablet  Chew 81 mg by mouth daily.     CINNAMON PO  Take 1 each by mouth daily. Take 1 spoonful with yogurt daily     COCONUT OIL PO  Take 1 each by mouth daily. Take 1 spoonful by mouth daily     cyclobenzaprine 10 MG tablet  Commonly known as:  FLEXERIL  Take 1 tablet (10 mg total) by mouth 3 (three) times daily as needed for muscle spasms.     Fish Oil 1200 MG Caps  Take 3,600 mg by mouth daily.     FLAX SEEDS PO  Take 1 each by mouth daily. Take 1 spoonful with yogurt daily     GLUCOS-CHONDROIT-MSM COMPLEX PO  Take 3 tablets by mouth every morning.     HYDROcodone-acetaminophen 5-325 MG per tablet  Commonly known as:  NORCO/VICODIN  Take 1-2 tablets by  mouth every 4 (four) hours as needed (mild pain).     nitroGLYCERIN 0.4 MG SL tablet  Commonly known as:  NITROSTAT  Place 1 tablet (0.4 mg total) under the tongue every 5 (five) minutes as needed for chest pain.     pantoprazole 40 MG tablet  Commonly known as:  PROTONIX  Take 1 tablet (40 mg total) by mouth daily.     propranolol 10 MG tablet  Commonly known as:  INDERAL  Take 5 mg by mouth 2 (two) times daily.     Saw Palmetto 450 MG Caps  Take 450 mg by mouth daily.     VISINE OP  Place 1 drop into both eyes 2 (two) times daily.     vitamin B-12 500 MCG tablet  Commonly known as:  CYANOCOBALAMIN  Take 2,500 mcg by mouth daily.           Follow-up Information    Follow up with Temple PaciniPOOL,Hedwig Mcfall A, MD.   Specialty:  Neurosurgery   Contact information:   [address] [place] [id number] [phone]       Signed: Temple PaciniOOL,Nakkia Mackiewicz A 12/21/2014, 9:47 AM

## 2014-12-21 NOTE — Progress Notes (Signed)
Pt doing well. Pt and wife given D/C instructions with Rx's, verbal understanding was provided. Pt's incision is covered with Honeycomb dressing and is clean and dry. Pt's IV was removed prior to D/C. Pt D/C'd home via wheelchair @ 1015 per MD order. Pt is stable @ D/C and has no other needs at this time. Rema FendtAshley Jamol Ginyard, RN

## 2014-12-22 ENCOUNTER — Encounter (HOSPITAL_COMMUNITY): Payer: Self-pay | Admitting: Neurosurgery

## 2014-12-28 ENCOUNTER — Ambulatory Visit: Payer: Medicare HMO | Admitting: Cardiology

## 2015-05-23 ENCOUNTER — Other Ambulatory Visit: Payer: Self-pay

## 2015-05-23 DIAGNOSIS — E785 Hyperlipidemia, unspecified: Secondary | ICD-10-CM

## 2015-06-02 LAB — LIPID PANEL
Cholesterol: 204 mg/dL — ABNORMAL HIGH (ref 125–200)
HDL: 72 mg/dL (ref >=40)
LDL Cholesterol: 120 mg/dL (ref <130)
Total CHOL/HDL Ratio: 2.8 Ratio (ref <=5.0)
Triglycerides: 59 mg/dL (ref <150)
VLDL: 12 mg/dL (ref <30)

## 2015-06-08 ENCOUNTER — Ambulatory Visit (INDEPENDENT_AMBULATORY_CARE_PROVIDER_SITE_OTHER): Payer: Medicare HMO | Admitting: Cardiology

## 2015-06-08 ENCOUNTER — Encounter: Payer: Self-pay | Admitting: Cardiology

## 2015-06-08 VITALS — BP 122/86 | HR 50 | Ht 73.0 in | Wt 205.0 lb

## 2015-06-08 DIAGNOSIS — I2510 Atherosclerotic heart disease of native coronary artery without angina pectoris: Secondary | ICD-10-CM | POA: Diagnosis not present

## 2015-06-08 DIAGNOSIS — R001 Bradycardia, unspecified: Secondary | ICD-10-CM | POA: Diagnosis not present

## 2015-06-08 DIAGNOSIS — E7800 Pure hypercholesterolemia, unspecified: Secondary | ICD-10-CM

## 2015-06-08 NOTE — Patient Instructions (Signed)
Your physician wants you to follow-up in:  6 months with Dr. McDowell. You will receive a reminder letter in the mail two months in advance. If you don't receive a letter, please call our office to schedule the follow-up appointment.  Your physician recommends that you continue on your current medications as directed. Please refer to the Current Medication list given to you today.  Thanks for choosing [facility]!!!    

## 2015-06-08 NOTE — Progress Notes (Signed)
Cardiology Office Note  Date: 06/08/2015   ID: Almer, Bushey June 23, 1951, MRN [id number]  PCP: Fredirick Maudlin, MD  Primary Cardiologist: Nona Dell, MD   Chief Complaint  Patient presents with  . Coronary Artery Disease    History of Present Illness: Jeffery Goodwin is a 64 y.o. male last seen in April. He presents for a routine follow-up visit. Still doing well from a cardiac perspective, no angina symptoms and NYHA class I dyspnea. He continues with his regular exercise plan which includes an hour of swimming every day except Sunday, usually proceeded by spinning and workout with weights. On Sunday he usually goes for an outdoor bike ride.  He had neck surgery back in April, underwent an uncomplicated 3 level anterior cervical decompression and fusion by Dr. Jordan Likes. No perioperative cardiac complications based on chart review.  We reviewed his medications which have been stable. As noted previously, he has had statin intolerance and has been managing lipids through diet and exercise. His most recent numbers are outlined below. LDL has come up some, but his HDL remains quite high.  He states he may be having hernia surgery on the right sometime in January. I doubt that he will need any follow-up ischemic testing unless he has a significant change in his functional capacity or new angina symptoms.   Past Medical History  Diagnosis Date  . Tremor     Right hand  . Carpal tunnel syndrome   . Arthritis   . GERD (gastroesophageal reflux disease)   . Dry eyes   . Coronary atherosclerosis of native coronary artery     DES mid LCx/OM2 12/2013  . Hyperlipidemia   . Neck pain   . Nocturia   . History of shingles     Past Surgical History  Procedure Laterality Date  . Arthroscopic left knee surgery  2005/2013  . Left inguinal herniorrhaphy  2008  . Carpal tunnel repair Right 1998  . Left heart catheterization with coronary angiogram N/A 12/11/2013    Procedure: LEFT HEART  CATHETERIZATION WITH CORONARY ANGIOGRAM;  Surgeon: [name], MD;  Location: [facility] CATH LAB;  Service: Cardiovascular;  Laterality: N/A;  . Colonoscopy    . Anterior cervical decomp/discectomy fusion N/A 12/20/2014    Procedure: ANTERIOR CERVICAL DECOMPRESSION/DISCECTOMY FUSION 3 LEVELS;  Surgeon: [name], MD;  Location: [facility] NEURO ORS;  Service: Neurosurgery;  Laterality: N/A;  ANTERIOR CERVICAL DECOMPRESSION/DISCECTOMY FUSION 3 LEVELS C3-4,4-5,5-6    Current Outpatient Prescriptions  Medication Sig Dispense Refill  . ascorbic acid (VITAMIN C) 1000 MG tablet Take 1,000 mg by mouth daily.    Marland Kitchen aspirin 81 MG chewable tablet Chew 81 mg by mouth daily.     Marland Kitchen CINNAMON PO Take 1 each by mouth daily. Take 1 spoonful with yogurt daily    . COCONUT OIL PO Take 1 each by mouth daily. Take 1 spoonful by mouth daily    . Flaxseed, Linseed, (FLAX SEEDS PO) Take 1 each by mouth daily. Take 1 spoonful with yogurt daily    . Misc Natural Products (GLUCOS-CHONDROIT-MSM COMPLEX PO) Take 3 tablets by mouth every morning.     . nitroGLYCERIN (NITROSTAT) 0.4 MG SL tablet Place 1 tablet (0.4 mg total) under the tongue every 5 (five) minutes as needed for chest pain. 25 tablet 12  . Omega-3 Fatty Acids (FISH OIL) 1200 MG CAPS Take 3,600 mg by mouth daily.    . propranolol (INDERAL) 10 MG tablet Take 5 mg by mouth  2 (two) times daily.     . Saw Palmetto 450 MG CAPS Take 450 mg by mouth daily.    . Tetrahydrozoline HCl (VISINE OP) Place 1 drop into both eyes 2 (two) times daily.    . vitamin B-12 (CYANOCOBALAMIN) 500 MCG tablet Take 2,500 mcg by mouth daily.      No current facility-administered medications for this visit.    Allergies:  Asa (intolerance to higher dose)  Social History: The patient  reports that he quit smoking about 6 years ago. His smoking use included Cigarettes. He started smoking about 42 years ago. He smoked 0.00 packs per day for 0 years. He has never used smokeless tobacco. He reports  that he drinks alcohol. He reports that he does not use illicit drugs.   ROS:  Please see the history of present illness. Otherwise, complete review of systems is positive for limited range of motion of his neck but improved compared to pre-surgery.  All other systems are reviewed and negative.   Physical Exam: VS:  BP 122/86 mmHg  Pulse 50  Ht  (1.854 m)  Wt 205 lb (92.987 kg)  BMI 27.05 kg/m2  SpO2 97%, BMI Body mass index is 27.05 kg/(m^2).  Wt Readings from Last 3 Encounters:  06/08/15 205 lb (92.987 kg)  12/20/14 202 lb (91.627 kg)  12/10/14 202 lb 12.8 oz (91.989 kg)     Appears comfortable at rest. HEENT: Conjunctiva and lids normal, oropharynx clear.  Neck: Supple, no elevated JVP or carotid bruits, no thyromegaly.  Lungs: Clear to auscultation, nonlabored breathing at rest.  Cardiac: Regular rate and rhythm, no S3 or significant systolic murmur, no pericardial rub.  Abdomen: Soft, nontender, bowel sounds present, no guarding or rebound.  Extremities: No pitting edema, distal pulses 2+. Skin: Warm and dry. Musculoskeletal: No kyphosis. Neuropsychiatric: Alert and oriented 3, affect appropriate.  ECG: ECG is not ordered today.  Recent Labwork: 12/10/2014: BUN 19; Creatinine, Ser 0.98; Hemoglobin 14.5; Platelets 180; Potassium 4.7; Sodium 139     Component Value Date/Time   CHOL 204* 06/01/2015 0856   TRIG 59 06/01/2015 0856   HDL 72 06/01/2015 0856   CHOLHDL 2.8 06/01/2015 0856   VLDL 12 06/01/2015 0856   LDLCALC 120 06/01/2015 0856    ASSESSMENT AND PLAN:  1. Symptomatically stable CAD status post DES to the circumflex and second obtuse marginal in April 2015. He continues to do quite well with regular exercise. No changes made today.  2. Diet managed hyperlipidemia with history of statin intolerance. Recent lipid numbers noted above.  3. Asymptomatic sinus bradycardia.  Current medicines were reviewed at length with the patient  today.  Disposition: FU with me in 6 months.   Signed, Jonelle Sidle, MD, [facility] 06/08/2015 11:27 AM    [facility] [address], [place], [place] [address] Phone: [phone]; Fax: [phone]

## 2015-06-16 ENCOUNTER — Encounter: Payer: Self-pay | Admitting: Internal Medicine

## 2015-09-12 ENCOUNTER — Emergency Department (HOSPITAL_COMMUNITY): Payer: Medicare HMO

## 2015-09-12 ENCOUNTER — Encounter (HOSPITAL_COMMUNITY): Payer: Self-pay | Admitting: Emergency Medicine

## 2015-09-12 ENCOUNTER — Inpatient Hospital Stay (HOSPITAL_COMMUNITY)
Admission: EM | Admit: 2015-09-12 | Discharge: 2015-09-16 | DRG: 482 | Disposition: A | Discharge status: Home Health | Payer: Medicare HMO | Attending: Pulmonary Disease | Admitting: Pulmonary Disease

## 2015-09-12 DIAGNOSIS — S72001A Fracture of unspecified part of neck of right femur, initial encounter for closed fracture: Secondary | ICD-10-CM | POA: Diagnosis not present

## 2015-09-12 DIAGNOSIS — M1990 Unspecified osteoarthritis, unspecified site: Secondary | ICD-10-CM | POA: Diagnosis present

## 2015-09-12 DIAGNOSIS — W000XXA Fall on same level due to ice and snow, initial encounter: Secondary | ICD-10-CM | POA: Diagnosis not present

## 2015-09-12 DIAGNOSIS — K219 Gastro-esophageal reflux disease without esophagitis: Secondary | ICD-10-CM | POA: Diagnosis not present

## 2015-09-12 DIAGNOSIS — Z808 Family history of malignant neoplasm of other organs or systems: Secondary | ICD-10-CM | POA: Diagnosis not present

## 2015-09-12 DIAGNOSIS — Y9289 Other specified places as the place of occurrence of the external cause: Secondary | ICD-10-CM | POA: Diagnosis not present

## 2015-09-12 DIAGNOSIS — Z87891 Personal history of nicotine dependence: Secondary | ICD-10-CM

## 2015-09-12 DIAGNOSIS — I2510 Atherosclerotic heart disease of native coronary artery without angina pectoris: Secondary | ICD-10-CM | POA: Diagnosis not present

## 2015-09-12 DIAGNOSIS — K5900 Constipation, unspecified: Secondary | ICD-10-CM | POA: Diagnosis not present

## 2015-09-12 DIAGNOSIS — S72142A Displaced intertrochanteric fracture of left femur, initial encounter for closed fracture: Secondary | ICD-10-CM | POA: Diagnosis not present

## 2015-09-12 DIAGNOSIS — M25552 Pain in left hip: Secondary | ICD-10-CM | POA: Diagnosis not present

## 2015-09-12 DIAGNOSIS — Z7982 Long term (current) use of aspirin: Secondary | ICD-10-CM

## 2015-09-12 DIAGNOSIS — S72002A Fracture of unspecified part of neck of left femur, initial encounter for closed fracture: Secondary | ICD-10-CM | POA: Diagnosis not present

## 2015-09-12 DIAGNOSIS — E785 Hyperlipidemia, unspecified: Secondary | ICD-10-CM | POA: Diagnosis not present

## 2015-09-12 DIAGNOSIS — S72009A Fracture of unspecified part of neck of unspecified femur, initial encounter for closed fracture: Secondary | ICD-10-CM | POA: Diagnosis present

## 2015-09-12 DIAGNOSIS — Z800 Family history of malignant neoplasm of digestive organs: Secondary | ICD-10-CM

## 2015-09-12 DIAGNOSIS — S72002S Fracture of unspecified part of neck of left femur, sequela: Secondary | ICD-10-CM | POA: Diagnosis not present

## 2015-09-12 DIAGNOSIS — S299XXA Unspecified injury of thorax, initial encounter: Secondary | ICD-10-CM | POA: Diagnosis not present

## 2015-09-12 LAB — URINALYSIS, ROUTINE W REFLEX MICROSCOPIC
Bilirubin Urine: NEGATIVE
Glucose, UA: NEGATIVE mg/dL
Hgb urine dipstick: NEGATIVE
Leukocytes, UA: NEGATIVE
Nitrite: NEGATIVE
Protein, ur: NEGATIVE mg/dL
Specific Gravity, Urine: 1.025 (ref 1.005–1.030)
pH: 5.5 (ref 5.0–8.0)

## 2015-09-12 LAB — BASIC METABOLIC PANEL
Anion gap: 7 (ref 5–15)
BUN: 26 mg/dL — ABNORMAL HIGH (ref 6–20)
CO2: 26 mmol/L (ref 22–32)
Calcium: 9.2 mg/dL (ref 8.9–10.3)
Chloride: 106 mmol/L (ref 101–111)
Creatinine, Ser: 1.08 mg/dL (ref 0.61–1.24)
GFR calc Af Amer: >60 mL/min (ref >60)
GFR calc non Af Amer: >60 mL/min (ref >60)
Glucose, Bld: 104 mg/dL — ABNORMAL HIGH (ref 65–99)
Potassium: 4.4 mmol/L (ref 3.5–5.1)
Sodium: 139 mmol/L (ref 135–145)

## 2015-09-12 LAB — CBC WITH DIFFERENTIAL/PLATELET
Basophils Absolute: 0 10*3/uL (ref 0.0–0.1)
Basophils Relative: 1 %
Eosinophils Absolute: 0.4 10*3/uL (ref 0.0–0.7)
Eosinophils Relative: 6 %
HCT: 42.2 % (ref 39.0–52.0)
Hemoglobin: 14.2 g/dL (ref 13.0–17.0)
Lymphocytes Relative: 30 %
Lymphs Abs: 1.9 10*3/uL (ref 0.7–4.0)
MCH: 31.1 pg (ref 26.0–34.0)
MCHC: 33.6 g/dL (ref 30.0–36.0)
MCV: 92.5 fL (ref 78.0–100.0)
Monocytes Absolute: 0.6 10*3/uL (ref 0.1–1.0)
Monocytes Relative: 9 %
Neutro Abs: 3.4 10*3/uL (ref 1.7–7.7)
Neutrophils Relative %: 54 %
Platelets: 180 10*3/uL (ref 150–400)
RBC: 4.56 MIL/uL (ref 4.22–5.81)
RDW: 12.2 % (ref 11.5–15.5)
WBC: 6.2 10*3/uL (ref 4.0–10.5)

## 2015-09-12 LAB — ABO/RH: ABO/RH(D): O POS

## 2015-09-12 LAB — PREPARE RBC (CROSSMATCH)

## 2015-09-12 MED ORDER — MORPHINE SULFATE (PF) 2 MG/ML IV SOLN
0.5000 mg | INTRAVENOUS | Status: DC | PRN
Start: 2015-09-12 — End: 2015-09-13
  Administered 2015-09-12 – 2015-09-13 (×2): 0.5 mg via INTRAVENOUS
  Filled 2015-09-12 (×2): qty 1

## 2015-09-12 MED ORDER — MORPHINE SULFATE (PF) 4 MG/ML IV SOLN
4.0000 mg | Freq: Once | INTRAVENOUS | Status: AC
  Administered 2015-09-12: 4 mg via INTRAVENOUS
  Filled 2015-09-12: qty 1

## 2015-09-12 MED ORDER — HYDROMORPHONE HCL 1 MG/ML IJ SOLN
1.0000 mg | INTRAMUSCULAR | Status: DC | PRN
  Administered 2015-09-12 – 2015-09-13 (×3): 1 mg via INTRAVENOUS
  Filled 2015-09-12 (×3): qty 1

## 2015-09-12 MED ORDER — VITAMIN C 500 MG PO TABS
500.0000 mg | ORAL_TABLET | Freq: Every day | ORAL | Status: DC
  Administered 2015-09-14 – 2015-09-15 (×2): 500 mg via ORAL
  Filled 2015-09-12 (×3): qty 1

## 2015-09-12 MED ORDER — HEPARIN SODIUM (PORCINE) 5000 UNIT/ML IJ SOLN: 5000.0000 [IU] | Freq: Three times a day (TID) | INTRAMUSCULAR | Status: DC

## 2015-09-12 MED ORDER — ONDANSETRON HCL 4 MG/2ML IJ SOLN
4.0000 mg | Freq: Once | INTRAMUSCULAR | Status: AC
  Administered 2015-09-12: 4 mg via INTRAMUSCULAR
  Filled 2015-09-12: qty 2

## 2015-09-12 MED ORDER — PANTOPRAZOLE SODIUM 40 MG PO TBEC
40.0000 mg | DELAYED_RELEASE_TABLET | Freq: Every day | ORAL | Status: DC
  Administered 2015-09-14 – 2015-09-15 (×2): 40 mg via ORAL
  Filled 2015-09-12 (×3): qty 1

## 2015-09-12 MED ORDER — ASPIRIN 81 MG PO CHEW
81.0000 mg | CHEWABLE_TABLET | Freq: Every day | ORAL | Status: DC
  Filled 2015-09-12: qty 1

## 2015-09-12 MED ORDER — PROPRANOLOL HCL 10 MG PO TABS
5.0000 mg | ORAL_TABLET | Freq: Two times a day (BID) | ORAL | Status: DC
  Administered 2015-09-12: 5 mg via ORAL
  Filled 2015-09-12 (×4): qty 0.5

## 2015-09-12 MED ORDER — SODIUM CHLORIDE 0.9 % IV SOLN
Freq: Once | INTRAVENOUS | Status: AC
  Administered 2015-09-12: 21:00:00 via INTRAVENOUS

## 2015-09-12 MED ORDER — ONDANSETRON HCL 4 MG/2ML IJ SOLN: 4.0000 mg | Freq: Three times a day (TID) | INTRAMUSCULAR | Status: DC | PRN

## 2015-09-12 MED ORDER — TETRAHYDROZOLINE HCL 0.05 % OP SOLN
1.0000 [drp] | Freq: Two times a day (BID) | OPHTHALMIC | Status: DC
  Filled 2015-09-12: qty 15

## 2015-09-12 MED ORDER — HYDROCODONE-ACETAMINOPHEN 5-325 MG PO TABS: 1.0000 | ORAL_TABLET | Freq: Four times a day (QID) | ORAL | Status: DC | PRN

## 2015-09-12 MED ORDER — POLYETHYLENE GLYCOL 3350 17 G PO PACK
17.0000 g | PACK | Freq: Every day | ORAL | Status: DC | PRN
  Administered 2015-09-14 – 2015-09-15 (×2): 17 g via ORAL
  Filled 2015-09-12 (×2): qty 1

## 2015-09-12 MED ORDER — HYDROMORPHONE HCL 1 MG/ML IJ SOLN
0.5000 mg | Freq: Once | INTRAMUSCULAR | Status: AC
  Administered 2015-09-12: 0.5 mg via INTRAVENOUS
  Filled 2015-09-12: qty 1

## 2015-09-12 NOTE — Consult Note (Signed)
HOSPITAL CONSULT [phone] (HIP FRACTURE )  MDM= MODERATE COMPLEXITY COMP HISTORY COMP EXAM  Patient ID: Jeffery Goodwin, male   DOB: [birth year], 65 y.o.   MRN: [id number]  New patient   Chief Complaint  Patient presents with  . Hip Pain, left      Jeffery Goodwin is a 65 y.o. male.   HPI 64 year old male who fell on the ice injured his left hip. Cannot walk. Has a history of coronary artery disease but has high functional level with up to 2 hours of cardiovascular exercise daily including biking and swimming.  Pain location left hip, duration one day, severity high, quality dull ache, modified by movement  Review of Systems (all) Review of Systems  All other systems reviewed and are negative.   Past Medical History  Diagnosis Date  . Tremor     Right hand  . Carpal tunnel syndrome   . Arthritis   . GERD (gastroesophageal reflux disease)   . Dry eyes   . Coronary atherosclerosis of native coronary artery     DES mid LCx/OM2 12/2013  . Hyperlipidemia   . Neck pain   . Nocturia   . History of shingles      Past Surgical History  Procedure Laterality Date  . Arthroscopic left knee surgery  2005/2013  . Left inguinal herniorrhaphy  2008  . Carpal tunnel repair Right 1998  . Left heart catheterization with coronary angiogram N/A 12/11/2013    Procedure: LEFT HEART CATHETERIZATION WITH CORONARY ANGIOGRAM;  Surgeon: [name], MD;  Location: [facility] CATH LAB;  Service: Cardiovascular;  Laterality: N/A;  . Colonoscopy    . Anterior cervical decomp/discectomy fusion N/A 12/20/2014    Procedure: ANTERIOR CERVICAL DECOMPRESSION/DISCECTOMY FUSION 3 LEVELS;  Surgeon: [name], MD;  Location: [facility] NEURO ORS;  Service: Neurosurgery;  Laterality: N/A;  ANTERIOR CERVICAL DECOMPRESSION/DISCECTOMY FUSION 3 LEVELS C3-4,4-5,5-6     Family History  Problem Relation Age of Onset  . Colon cancer Mother   . Brain cancer Father      Social History Social History  Substance Use Topics  .  Smoking status: Former Smoker -- 0.00 packs/day for 0 years    Types: Cigarettes    Start date: 09/03/1972    Quit date: 09/03/2008  . Smokeless tobacco: Never Used     Comment: quit smoking 6 yrs ago.   . Alcohol Use: 0.0 oz/week    0 Standard drinks or equivalent per week     Comment: 2 beers a day     Allergies  Allergen Reactions  . Asa [Aspirin] Other (See Comments)    Stomach ache at 325 mg dose can take 81 mg dose    Current Facility-Administered Medications  Medication Dose Route Frequency Provider Last Rate Last Dose  . 0.9 %  sodium chloride infusion   Intravenous Once [name], MD      . aspirin chewable tablet 81 mg  81 mg Oral Daily Meredeth Ide, MD   81 mg at 09/12/15 1830  . HYDROcodone-acetaminophen (NORCO/VICODIN) 5-325 MG per tablet 1-2 tablet  1-2 tablet Oral Q6H PRN Meredeth Ide, MD      . HYDROmorphone (DILAUDID) injection 1 mg  1 mg Intravenous Q4H PRN Lavera Guise, MD   1 mg at 09/12/15 1830  . morphine 2 MG/ML injection 0.5 mg  0.5 mg Intravenous Q2H PRN Meredeth Ide, MD      . ondansetron [facility]) injection 4 mg  4 mg  Intravenous Q8H PRN Lavera Guiseana Duo Liu, MD      . pantoprazole (PROTONIX) EC tablet 40 mg  40 mg Oral Daily Meredeth IdeGagan S Lama, MD   40 mg at 09/12/15 1830  . polyethylene glycol (MIRALAX / GLYCOLAX) packet 17 g  17 g Oral Daily PRN Meredeth IdeGagan S Lama, MD      . propranolol (INDERAL) tablet 5 mg  5 mg Oral BID Meredeth IdeGagan S Lama, MD      . tetrahydrozoline 0.05 % ophthalmic solution 1 drop  1 drop Both Eyes BID Meredeth IdeGagan S Lama, MD      . vitamin C (ASCORBIC ACID) tablet 500 mg  500 mg Oral Daily Meredeth IdeGagan S Lama, MD   500 mg at 09/12/15 1830     Physical Exam(=30) Blood pressure 112/66, pulse 52, temperature 98.6 F (37 C), resp. rate 15, height 6\' 1"  (1.854 m), weight 205 lb (92.987 kg), SpO2 97 %. Gen. Appearance Peripheral vascular system Lymph nodes Gait  Upper extremities  Inspection revealed no malalignment or asymmetry  Assessment of range of  motion: Full range of motion was recorded  Assessment of stability: Elbow wrist and hand and shoulder were stable  Assessment of muscle strength and tone revealed grade 5 muscle strength and normal muscle tone  Skin was normal without rash lesion or ulceration  Right lower extremity  Inspection revealed no malalignment or asymmetry  Assessment of range of motion: Full range of motion was recorded  Assessment of stability: Elbow wrist and hand and shoulder were stable  Assessment of muscle strength and tone revealed grade 5 muscle strength and normal muscle tone  Skin was normal without rash lesion or ulceration  Left lower extremity  Inspection mild leg length shortening and external rotation  Range of motion deferred because of pain and fracture, foot moves normally  Stability no subluxation of the knee or ankle  Muscle tone normal  Skin intact  Coordination was tested by finger-to-nose nose and was normal Deep tendon reflexes were 2+ in the upper extremities  Examination of sensation by touch was normal  Mental status  Oriented to time person and place normal  Mood and affect normal without depression anxiety or agitation  Dx:   Data Reviewed  I reviewed the images and the reports and my independent interpretation is 2 PART INTERTROCHANTERIC HIP FRACTURE    Assessment  LEFT HIP INTERTROCHANTERIC HIP FRACTURE  Plan   OTIF LEFT HIP  The risks of the surgery have been explained to the patient and they include but are not limited to bleeding infection limp shortening of the limb deep vein thrombosis, pulmonary embolus. Nonhealing, fracture implant failure.

## 2015-09-12 NOTE — H&P (Signed)
PCP:   Fredirick Maudlin, MD   Chief Complaint:  Fall  HPI: 65 year old male who   has a past medical history of Tremor; Carpal tunnel syndrome; Arthritis; GERD (gastroesophageal reflux disease); Dry eyes; Coronary atherosclerosis of native coronary artery; Hyperlipidemia; Neck pain; Nocturia; and History of shingles. Today presents to the ED after patient had a mechanical fall while coming out of truck. Patient landed on his left side and complained of hip pain. In the ED x-ray of the hip showed displaced intertrochanteric fracture of proximal left femur. Patient denies any history of dizziness or passing out. He does have a history of CAD but is very active. Denies any history of chest pain or shortness of breath. No nausea vomiting or diarrhea. No fever. No dysuria urgency frequency of urination.     Allergies:   Allergies  Allergen Reactions  . Asa [Aspirin] Other (See Comments)    Stomach ache at 325 mg dose can take 81 mg dose      Past Medical History  Diagnosis Date  . Tremor     Right hand  . Carpal tunnel syndrome   . Arthritis   . GERD (gastroesophageal reflux disease)   . Dry eyes   . Coronary atherosclerosis of native coronary artery     DES mid LCx/OM2 12/2013  . Hyperlipidemia   . Neck pain   . Nocturia   . History of shingles     Past Surgical History  Procedure Laterality Date  . Arthroscopic left knee surgery  2005/2013  . Left inguinal herniorrhaphy  2008  . Carpal tunnel repair Right 1998  . Left heart catheterization with coronary angiogram N/A 12/11/2013    Procedure: LEFT HEART CATHETERIZATION WITH CORONARY ANGIOGRAM;  Surgeon: [name], MD;  Location: [facility] CATH LAB;  Service: Cardiovascular;  Laterality: N/A;  . Colonoscopy    . Anterior cervical decomp/discectomy fusion N/A 12/20/2014    Procedure: ANTERIOR CERVICAL DECOMPRESSION/DISCECTOMY FUSION 3 LEVELS;  Surgeon: [name], MD;  Location: [facility] NEURO ORS;  Service: Neurosurgery;   Laterality: N/A;  ANTERIOR CERVICAL DECOMPRESSION/DISCECTOMY FUSION 3 LEVELS C3-4,4-5,5-6    Prior to Admission medications   Medication Sig Start Date End Date Taking? Authorizing Provider  aspirin 81 MG chewable tablet Chew 81 mg by mouth daily.    Yes Historical Provider, MD  CINNAMON PO Take 1 each by mouth daily. Take 1 spoonful with yogurt daily   Yes Historical Provider, MD  COCONUT OIL PO Take 1 each by mouth daily. Take 1 spoonful by mouth daily   Yes Historical Provider, MD  Cyanocobalamin (B-12) 3000 MCG CAPS Take 1 capsule by mouth daily.   Yes Historical Provider, MD  Flaxseed, Linseed, (FLAX SEEDS PO) Take 1 each by mouth daily. Take 1 spoonful with yogurt daily   Yes Historical Provider, MD  KRILL OIL PO Take 750 mg by mouth daily.   Yes Historical Provider, MD  Misc Natural Products (GLUCOS-CHONDROIT-MSM COMPLEX PO) Take 3 tablets by mouth every morning.    Yes Historical Provider, MD  Omega-3 Fatty Acids (FISH OIL) 1200 MG CAPS Take 3,600 mg by mouth daily.   Yes Historical Provider, MD  pantoprazole (PROTONIX) 40 MG tablet Take 40 mg by mouth daily.   Yes Historical Provider, MD  propranolol (INDERAL) 10 MG tablet Take 5-10 mg by mouth daily.    Yes Historical Provider, MD  Saw Palmetto 450 MG CAPS Take 450 mg by mouth daily.   Yes Historical Provider, MD  Tetrahydrozoline HCl (  VISINE OP) Place 1 drop into both eyes 2 (two) times daily.   Yes Historical Provider, MD  vitamin C (ASCORBIC ACID) 500 MG tablet Take 500 mg by mouth daily.   Yes Historical Provider, MD    Social History:  reports that he quit smoking about 7 years ago. His smoking use included Cigarettes. He started smoking about 43 years ago. He smoked 0.00 packs per day for 0 years. He has never used smokeless tobacco. He reports that he drinks alcohol. He reports that he does not use illicit drugs.  Family History  Problem Relation Age of Onset  . Colon cancer Mother   . Brain cancer Father     Ceasar MonsFiled  Weights   09/12/15 1500  Weight: 92.987 kg (205 lb)    All the positives are listed in BOLD  Review of Systems:  HEENT: Headache, blurred vision, runny nose, sore throat Neck: Hypothyroidism, hyperthyroidism,,lymphadenopathy Chest : Shortness of breath, history of COPD, Asthma Heart : Chest pain, history of coronary arterey disease GI:  Nausea, vomiting, diarrhea, constipation, GERD GU: Dysuria, urgency, frequency of urination, hematuria Neuro: Stroke, seizures, syncope Psych: Depression, anxiety, hallucinations   Physical Exam: Blood pressure 134/74, pulse 55, temperature 98.6 F (37 C), resp. rate 13, height 6\' 1"  (1.854 m), weight 92.987 kg (205 lb), SpO2 97 %. Constitutional:   Patient is a well-developed and well-nourished male* in no acute distress and cooperative with exam. Head: Normocephalic and atraumatic Mouth: Mucus membranes moist Eyes: PERRL, EOMI, conjunctivae normal Neck: Supple, No Thyromegaly Cardiovascular: RRR, S1 normal, S2 normal Pulmonary/Chest: CTAB, no wheezes, rales, or rhonchi Abdominal: Soft. Non-tender, non-distended, bowel sounds are normal, no masses, organomegaly, or guarding present.  Neurological: A&O x3, Strength is normal and symmetric bilaterally, cranial nerve II-XII are grossly intact, no focal motor deficit, sensory intact to light touch bilaterally.  Extremities : No Cyanosis, Clubbing or Edema  Labs on Admission:  Basic Metabolic Panel:  Recent Labs Lab 09/12/15 1510  NA 139  K 4.4  CL 106  CO2 26  GLUCOSE 104*  BUN 26*  CREATININE 1.08  CALCIUM 9.2   CBC:  Recent Labs Lab 09/12/15 1510  WBC 6.2  NEUTROABS 3.4  HGB 14.2  HCT 42.2  MCV 92.5  PLT 180    Radiological Exams on Admission: Dg Chest 1 View  09/12/2015  CLINICAL DATA:  Pt fell on the ice today EXAM: CHEST 1 VIEW COMPARISON:  None. FINDINGS: The heart size and mediastinal contours are within normal limits. Both lungs are clear. The visualized skeletal  structures are unremarkable. IMPRESSION: No active disease. Electronically Signed   By: Charlett NoseKevin  Dover M.D.   On: 09/12/2015 16:18   Dg Hip Unilat With Pelvis Min 4 Views Left  09/12/2015  CLINICAL DATA:  Larey SeatFell on ice today EXAM: DG HIP (WITH OR WITHOUT PELVIS) 4+V LEFT COMPARISON:  None. FINDINGS: Three views of the left hip submitted. There is displaced intertrochanteric fracture of proximal left femur. IMPRESSION: Displaced intertrochanteric fracture of proximal left femur. Electronically Signed   By: Natasha MeadLiviu  Pop M.D.   On: 09/12/2015 16:18    EKG: Independently reviewed. Normal sinus rhythm   Assessment/Plan Active Problems:   Coronary atherosclerosis of native coronary artery   Hip fracture (HCC)  Hip fracture Will admit the patient in telemetry. Orthopedic surgery consulted by the ED physician. Dr. Romeo AppleHarrison to see the patient. Continue pain medications as per hip fracture protocol.  History of CAD Stable Patient has drug-eluting stent to the circumflex/obtuse  marginal system. Plavix was stopped per cardiology in April 2016 Continue aspirin EKG shows normal sinus rhythm   DVT prophylaxis Heparin  Risk stratification Patient is functionally very active. Has no acute cardiac issues. Patient is mild to moderate risk for surgery.  Code status: Full code  Family discussion: Admission, patients condition and plan of care including tests being ordered have been discussed with the patient and his wife at bedside who indicate understanding and agree with the plan and Code Status.   Time Spent on Admission: 60 min  Tama Grosz S [facility] Pager: [phone] 09/12/2015, 5:16 PM  If 7PM-7AM, please contact night-coverage  [email or web address]  Password [email or web address]

## 2015-09-12 NOTE — ED Provider Notes (Signed)
CSN: [id number]     Arrival date & time 09/12/15  1454 History   First MD Initiated Contact with Patient 09/12/15 1500     Chief Complaint  Patient presents with  . Hip Pain     (Consider location/radiation/quality/duration/timing/severity/associated sxs/prior Treatment) HPI  65 year old male who presents with left hip pain after fall. Just PTA states he was coming off of his pick up truck when he slipped on ice on the ground and fell on he left hip. Denies head strike or LOC. Unable to walk due to pain. No abd pain, chest pain, neck or back pain, difficulty breathing, headache, numbness or weakness. Takes baby aspirin. No other anticoagulants   Past Medical History  Diagnosis Date  . Tremor     Right hand  . Carpal tunnel syndrome   . Arthritis   . GERD (gastroesophageal reflux disease)   . Dry eyes   . Coronary atherosclerosis of native coronary artery     DES mid LCx/OM2 12/2013  . Hyperlipidemia   . Neck pain   . Nocturia   . History of shingles    Past Surgical History  Procedure Laterality Date  . Arthroscopic left knee surgery  2005/2013  . Left inguinal herniorrhaphy  2008  . Carpal tunnel repair Right 1998  . Left heart catheterization with coronary angiogram N/A 12/11/2013    Procedure: LEFT HEART CATHETERIZATION WITH CORONARY ANGIOGRAM;  Surgeon: [name], MD;  Location: [facility] CATH LAB;  Service: Cardiovascular;  Laterality: N/A;  . Colonoscopy    . Anterior cervical decomp/discectomy fusion N/A 12/20/2014    Procedure: ANTERIOR CERVICAL DECOMPRESSION/DISCECTOMY FUSION 3 LEVELS;  Surgeon: [name], MD;  Location: [facility] NEURO ORS;  Service: Neurosurgery;  Laterality: N/A;  ANTERIOR CERVICAL DECOMPRESSION/DISCECTOMY FUSION 3 LEVELS C3-4,4-5,5-6   Family History  Problem Relation Age of Onset  . Colon cancer Mother   . Brain cancer Father    Social History  Substance Use Topics  . Smoking status: Former Smoker -- 0.00 packs/day for 0 years    Types: Cigarettes    Start date: 09/03/1972    Quit date: 09/03/2008  . Smokeless tobacco: Never Used     Comment: quit smoking 6 yrs ago.   . Alcohol Use: 0.0 oz/week    0 Standard drinks or equivalent per week     Comment: 2 beers a day    Review of Systems 10/14 systems reviewed and are negative other than those stated in the HPI    Allergies  Asa  Home Medications   Prior to Admission medications   Medication Sig Start Date End Date Taking? Authorizing Provider  aspirin 81 MG chewable tablet Chew 81 mg by mouth daily.    Yes Historical Provider, MD  Cyanocobalamin (B-12) 3000 MCG CAPS Take 1 capsule by mouth daily.   Yes Historical Provider, MD  KRILL OIL PO Take 750 mg by mouth daily.   Yes Historical Provider, MD  Misc Natural Products (GLUCOS-CHONDROIT-MSM COMPLEX PO) Take 3 tablets by mouth every morning.    Yes Historical Provider, MD  pantoprazole (PROTONIX) 40 MG tablet Take 40 mg by mouth daily.   Yes Historical Provider, MD  propranolol (INDERAL) 10 MG tablet Take 5 mg by mouth 2 (two) times daily.    Yes Historical Provider, MD  Saw Palmetto 450 MG CAPS Take 450 mg by mouth daily.   Yes Historical Provider, MD  vitamin C (ASCORBIC ACID) 500 MG tablet Take 500 mg by mouth daily.  Yes Historical Provider, MD  CINNAMON PO Take 1 each by mouth daily. Take 1 spoonful with yogurt daily    Historical Provider, MD  COCONUT OIL PO Take 1 each by mouth daily. Take 1 spoonful by mouth daily    Historical Provider, MD  Flaxseed, Linseed, (FLAX SEEDS PO) Take 1 each by mouth daily. Take 1 spoonful with yogurt daily    Historical Provider, MD  nitroGLYCERIN (NITROSTAT) 0.4 MG SL tablet Place 1 tablet (0.4 mg total) under the tongue every 5 (five) minutes as needed for chest pain. 12/12/13   Dwana Melena, PA-C  Omega-3 Fatty Acids (FISH OIL) 1200 MG CAPS Take 3,600 mg by mouth daily.    Historical Provider, MD  Tetrahydrozoline HCl (VISINE OP) Place 1 drop into both eyes 2 (two) times daily.     Historical Provider, MD   BP 125/82 mmHg  Pulse 50  Temp(Src) 97.9 F (36.6 C)  Resp 14  Ht 6\' 1"  (1.854 m)  Wt 205 lb (92.987 kg)  BMI 27.05 kg/m2  SpO2 97% Physical Exam  Physical Exam  Nursing note and vitals reviewed. Constitutional: Well developed, well nourished, non-toxic, and in no acute distress Head: Normocephalic and atraumatic.  Mouth/Throat: Oropharynx is clear and moist.  Neck: Normal range of motion. Neck supple.  no cervical spine tenderness. Cardiovascular: Normal rate and regular rhythm.   +2 DP and PT pulses bilaterally. Pulmonary/Chest: Effort normal and breath sounds normal.  no chest wall tenderness. Abdominal: Soft. There is no tenderness. There is no rebound and no guarding.  Musculoskeletal: Left lower extremity is  Shortened and externally rotated. There is tenderness to palpation of the left hip. Full ROM in all other extremities.  Neurological: Alert, no facial droop, fluent speech, moves all extremities symmetrically Skin: Skin is warm and dry.  Psychiatric: Cooperative   ED Course  Procedures (including critical care time) Labs Review Labs Reviewed  BASIC METABOLIC PANEL - Abnormal; Notable for the following:    Glucose, Bld 104 (*)    BUN 26 (*)    All other components within normal limits  CBC WITH DIFFERENTIAL/PLATELET    Imaging Review Dg Chest 1 View  09/12/2015  CLINICAL DATA:  Pt fell on the ice today EXAM: CHEST 1 VIEW COMPARISON:  None. FINDINGS: The heart size and mediastinal contours are within normal limits. Both lungs are clear. The visualized skeletal structures are unremarkable. IMPRESSION: No active disease. Electronically Signed   By: Charlett Nose M.D.   On: 09/12/2015 16:18   Dg Hip Unilat With Pelvis Min 4 Views Left  09/12/2015  CLINICAL DATA:  Larey Seat on ice today EXAM: DG HIP (WITH OR WITHOUT PELVIS) 4+V LEFT COMPARISON:  None. FINDINGS: Three views of the left hip submitted. There is displaced intertrochanteric fracture of  proximal left femur. IMPRESSION: Displaced intertrochanteric fracture of proximal left femur. Electronically Signed   By: Natasha Mead M.D.   On: 09/12/2015 16:18   I have personally reviewed and evaluated these images and lab results as part of my medical decision-making.   EKG Interpretation None      MDM   Final diagnoses:  Hip pain, acute, left  Intertrochanteric fracture of left hip, closed, initial encounter [facility])     65 year old male with history of CAD on aspirin who presents after mechanical fall with left hip pain. Is well-appearing and in no acute distress, with left lower extremity that is shortened and externally rotated. Extremity is neurovascularly intact.  There are no other  injuries by history and exam.  X-ray of the hip and pelvis reveals intertrochanteric left femur fracture that is closed and mildly displaced. Discussed with Dr. Romeo AppleHarrison from orthopedic surgery who will plan for surgery likely on Wednesday. I discussed with Dr. Sharl MaLama from hospitalist service who will admit for ongoing management pending operative repair.   Lavera Guiseana Duo Ortha Metts, MD 09/12/15 [phone]

## 2015-09-12 NOTE — ED Notes (Signed)
Pt c/o left hip pain after falling on ice x 1 hour ago. Shortening and external rotation in left leg noted. C/m/s intact. Denies hitting head/loc. nad noted.

## 2015-09-13 ENCOUNTER — Encounter (HOSPITAL_COMMUNITY): Payer: Self-pay | Admitting: *Deleted

## 2015-09-13 ENCOUNTER — Inpatient Hospital Stay (HOSPITAL_COMMUNITY): Payer: Medicare HMO | Admitting: Anesthesiology

## 2015-09-13 ENCOUNTER — Inpatient Hospital Stay (HOSPITAL_COMMUNITY): Payer: Medicare HMO

## 2015-09-13 ENCOUNTER — Encounter (HOSPITAL_COMMUNITY)
Admission: EM | Disposition: A | Discharge status: Home Health | Payer: Self-pay | Source: Home / Self Care | Attending: Pulmonary Disease

## 2015-09-13 DIAGNOSIS — S72142A Displaced intertrochanteric fracture of left femur, initial encounter for closed fracture: Principal | ICD-10-CM | POA: Diagnosis present

## 2015-09-13 HISTORY — PX: INTRAMEDULLARY (IM) NAIL INTERTROCHANTERIC: SHX5875

## 2015-09-13 LAB — SURGICAL PCR SCREEN
MRSA, PCR: NEGATIVE
Staphylococcus aureus: POSITIVE — AB

## 2015-09-13 LAB — PROTIME-INR
INR: 1.03 (ref 0.00–1.49)
Prothrombin Time: 13.7 s (ref 11.6–15.2)

## 2015-09-13 SURGERY — FIXATION, FRACTURE, INTERTROCHANTERIC, WITH INTRAMEDULLARY ROD
Anesthesia: Spinal | Site: Hip | Laterality: Left

## 2015-09-13 MED ORDER — SUCCINYLCHOLINE CHLORIDE 20 MG/ML IJ SOLN
INTRAMUSCULAR | Status: AC
  Filled 2015-09-13: qty 1

## 2015-09-13 MED ORDER — MIDAZOLAM HCL 5 MG/5ML IJ SOLN
INTRAMUSCULAR | Status: DC | PRN
  Administered 2015-09-13: 1 mg via INTRAVENOUS
  Administered 2015-09-13 (×2): 0.5 mg via INTRAVENOUS

## 2015-09-13 MED ORDER — LIDOCAINE HCL (CARDIAC) 10 MG/ML IV SOLN
INTRAVENOUS | Status: DC | PRN
  Administered 2015-09-13: 50 mg via INTRAVENOUS

## 2015-09-13 MED ORDER — OXYCODONE-ACETAMINOPHEN 5-325 MG PO TABS
1.0000 | ORAL_TABLET | ORAL | Status: DC | PRN
  Administered 2015-09-13 – 2015-09-14 (×3): 1 via ORAL
  Filled 2015-09-13 (×3): qty 1

## 2015-09-13 MED ORDER — MIDAZOLAM HCL 2 MG/2ML IJ SOLN
INTRAMUSCULAR | Status: AC
  Filled 2015-09-13: qty 2

## 2015-09-13 MED ORDER — ONDANSETRON HCL 4 MG/2ML IJ SOLN: 4.0000 mg | Freq: Once | INTRAMUSCULAR | Status: DC | PRN

## 2015-09-13 MED ORDER — CHLORHEXIDINE GLUCONATE 4 % EX LIQD
60.0000 mL | Freq: Once | CUTANEOUS | Status: AC
  Administered 2015-09-13: 4 via TOPICAL
  Filled 2015-09-13: qty 15

## 2015-09-13 MED ORDER — METOCLOPRAMIDE HCL 5 MG/ML IJ SOLN: 5.0000 mg | Freq: Three times a day (TID) | INTRAMUSCULAR | Status: DC | PRN

## 2015-09-13 MED ORDER — ONDANSETRON HCL 4 MG PO TABS: 4.0000 mg | ORAL_TABLET | Freq: Four times a day (QID) | ORAL | Status: DC | PRN

## 2015-09-13 MED ORDER — MORPHINE SULFATE (PF) 4 MG/ML IV SOLN
5.0000 mg | INTRAVENOUS | Status: DC | PRN
  Administered 2015-09-13 – 2015-09-14 (×2): 5 mg via INTRAVENOUS
  Filled 2015-09-13 (×2): qty 2

## 2015-09-13 MED ORDER — SODIUM CHLORIDE 0.9 % IR SOLN
Status: DC | PRN
  Administered 2015-09-13: 1000 mL

## 2015-09-13 MED ORDER — ATROPINE SULFATE 0.4 MG/ML IJ SOLN
INTRAMUSCULAR | Status: AC
  Filled 2015-09-13: qty 1

## 2015-09-13 MED ORDER — MUPIROCIN 2 % EX OINT
1.0000 "application " | TOPICAL_OINTMENT | Freq: Two times a day (BID) | CUTANEOUS | Status: DC
  Administered 2015-09-13 – 2015-09-16 (×7): 1 via NASAL
  Filled 2015-09-13 (×2): qty 22

## 2015-09-13 MED ORDER — FENTANYL CITRATE (PF) 100 MCG/2ML IJ SOLN
25.0000 ug | INTRAMUSCULAR | Status: AC
  Administered 2015-09-13 (×2): 25 ug via INTRAVENOUS

## 2015-09-13 MED ORDER — CEFAZOLIN SODIUM-DEXTROSE 2-3 GM-% IV SOLR
2.0000 g | INTRAVENOUS | Status: AC
Start: 2015-09-13 — End: 2015-09-13
  Administered 2015-09-13: 2 g via INTRAVENOUS
  Filled 2015-09-13 (×2): qty 50

## 2015-09-13 MED ORDER — ATROPINE SULFATE 0.4 MG/ML IJ SOLN
INTRAMUSCULAR | Status: DC | PRN
  Administered 2015-09-13: 0.4 mg via INTRAVENOUS

## 2015-09-13 MED ORDER — WARFARIN SODIUM 5 MG PO TABS
2.5000 mg | ORAL_TABLET | Freq: Every day | ORAL | Status: DC
  Administered 2015-09-14 – 2015-09-15 (×2): 2.5 mg via ORAL
  Filled 2015-09-13 (×2): qty 1

## 2015-09-13 MED ORDER — GLYCOPYRROLATE 0.2 MG/ML IJ SOLN
INTRAMUSCULAR | Status: DC | PRN
  Administered 2015-09-13: 0.2 mg via INTRAVENOUS

## 2015-09-13 MED ORDER — CHLORHEXIDINE GLUCONATE CLOTH 2 % EX PADS
6.0000 | MEDICATED_PAD | Freq: Every day | CUTANEOUS | Status: DC
  Administered 2015-09-13: 6 via TOPICAL

## 2015-09-13 MED ORDER — SODIUM CHLORIDE 0.9 % IJ SOLN
INTRAMUSCULAR | Status: AC
  Filled 2015-09-13: qty 10

## 2015-09-13 MED ORDER — LIDOCAINE HCL (PF) 1 % IJ SOLN
INTRAMUSCULAR | Status: AC
  Filled 2015-09-13: qty 5

## 2015-09-13 MED ORDER — MIDAZOLAM HCL 2 MG/2ML IJ SOLN
1.0000 mg | INTRAMUSCULAR | Status: DC | PRN
  Administered 2015-09-13: 2 mg via INTRAVENOUS

## 2015-09-13 MED ORDER — BUPIVACAINE IN DEXTROSE 0.75-8.25 % IT SOLN
INTRATHECAL | Status: AC
  Filled 2015-09-13: qty 2

## 2015-09-13 MED ORDER — HYDROCODONE-ACETAMINOPHEN 5-325 MG PO TABS
1.0000 | ORAL_TABLET | Freq: Four times a day (QID) | ORAL | Status: DC | PRN
  Administered 2015-09-13: 1 via ORAL
  Filled 2015-09-13: qty 1

## 2015-09-13 MED ORDER — ONDANSETRON HCL 4 MG/2ML IJ SOLN: 4.0000 mg | Freq: Four times a day (QID) | INTRAMUSCULAR | Status: DC | PRN

## 2015-09-13 MED ORDER — EPHEDRINE SULFATE 50 MG/ML IJ SOLN
INTRAMUSCULAR | Status: AC
  Filled 2015-09-13: qty 1

## 2015-09-13 MED ORDER — FENTANYL CITRATE (PF) 100 MCG/2ML IJ SOLN
INTRAMUSCULAR | Status: AC
  Filled 2015-09-13: qty 2

## 2015-09-13 MED ORDER — OXYCODONE HCL 5 MG PO TABS
5.0000 mg | ORAL_TABLET | ORAL | Status: DC | PRN
  Administered 2015-09-13 – 2015-09-14 (×2): 5 mg via ORAL
  Filled 2015-09-13 (×2): qty 1

## 2015-09-13 MED ORDER — EPINEPHRINE HCL 0.1 MG/ML IJ SOSY
PREFILLED_SYRINGE | INTRAMUSCULAR | Status: DC | PRN
  Administered 2015-09-13: .1 ug via INTRAVENOUS

## 2015-09-13 MED ORDER — NAPHAZOLINE-GLYCERIN 0.012-0.2 % OP SOLN
1.0000 [drp] | Freq: Two times a day (BID) | OPHTHALMIC | Status: DC
  Administered 2015-09-13 – 2015-09-16 (×7): 1 [drp] via OPHTHALMIC
  Filled 2015-09-13: qty 15

## 2015-09-13 MED ORDER — WARFARIN - PHYSICIAN DOSING INPATIENT: Freq: Every day | Status: DC

## 2015-09-13 MED ORDER — PROPOFOL 500 MG/50ML IV EMUL
INTRAVENOUS | Status: DC | PRN
  Administered 2015-09-13: 25 ug/kg/min via INTRAVENOUS
  Administered 2015-09-13: 150 ug/kg/min via INTRAVENOUS

## 2015-09-13 MED ORDER — BUPIVACAINE-EPINEPHRINE (PF) 0.5% -1:200000 IJ SOLN
INTRAMUSCULAR | Status: AC
  Filled 2015-09-13: qty 60

## 2015-09-13 MED ORDER — BUPIVACAINE IN DEXTROSE 0.75-8.25 % IT SOLN
INTRATHECAL | Status: DC | PRN
  Administered 2015-09-13: 15 mg via INTRATHECAL

## 2015-09-13 MED ORDER — ACETAMINOPHEN 325 MG PO TABS: 650.0000 mg | ORAL_TABLET | Freq: Four times a day (QID) | ORAL | Status: DC | PRN

## 2015-09-13 MED ORDER — PHENOL 1.4 % MT LIQD: 1.0000 | OROMUCOSAL | Status: DC | PRN

## 2015-09-13 MED ORDER — PROPRANOLOL HCL 10 MG PO TABS
5.0000 mg | ORAL_TABLET | Freq: Two times a day (BID) | ORAL | Status: DC
  Filled 2015-09-13 (×3): qty 0.5

## 2015-09-13 MED ORDER — MENTHOL 3 MG MT LOZG: 1.0000 | LOZENGE | OROMUCOSAL | Status: DC | PRN

## 2015-09-13 MED ORDER — PROPOFOL 10 MG/ML IV BOLUS
INTRAVENOUS | Status: AC
  Filled 2015-09-13: qty 40

## 2015-09-13 MED ORDER — EPHEDRINE SULFATE 50 MG/ML IJ SOLN
INTRAMUSCULAR | Status: DC | PRN
  Administered 2015-09-13 (×6): 5 mg via INTRAVENOUS

## 2015-09-13 MED ORDER — ONDANSETRON HCL 4 MG/2ML IJ SOLN
4.0000 mg | Freq: Once | INTRAMUSCULAR | Status: AC
  Administered 2015-09-13: 4 mg via INTRAVENOUS
  Filled 2015-09-13: qty 2

## 2015-09-13 MED ORDER — GLYCOPYRROLATE 0.2 MG/ML IJ SOLN
INTRAMUSCULAR | Status: AC
  Filled 2015-09-13: qty 1

## 2015-09-13 MED ORDER — PROPRANOLOL HCL 10 MG PO TABS
5.0000 mg | ORAL_TABLET | Freq: Two times a day (BID) | ORAL | Status: DC
  Administered 2015-09-13 – 2015-09-14 (×4): 5 mg via ORAL
  Administered 2015-09-15: 10:00:00 via ORAL
  Administered 2015-09-15: 5 mg via ORAL
  Filled 2015-09-13 (×13): qty 0.5

## 2015-09-13 MED ORDER — FENTANYL CITRATE (PF) 100 MCG/2ML IJ SOLN
INTRAMUSCULAR | Status: DC | PRN
  Administered 2015-09-13: 12.5 ug via INTRATHECAL
  Administered 2015-09-13: 12.5 ug via INTRAVENOUS
  Administered 2015-09-13: 25 ug via INTRAVENOUS

## 2015-09-13 MED ORDER — ACETAMINOPHEN 650 MG RE SUPP: 650.0000 mg | Freq: Four times a day (QID) | RECTAL | Status: DC | PRN

## 2015-09-13 MED ORDER — SODIUM CHLORIDE 0.9 % IV SOLN
INTRAVENOUS | Status: DC
  Administered 2015-09-13 – 2015-09-14 (×2): via INTRAVENOUS

## 2015-09-13 MED ORDER — BUPIVACAINE-EPINEPHRINE (PF) 0.5% -1:200000 IJ SOLN
INTRAMUSCULAR | Status: DC | PRN
  Administered 2015-09-13: 60 mL via PERINEURAL

## 2015-09-13 MED ORDER — CEFAZOLIN SODIUM-DEXTROSE 2-3 GM-% IV SOLR
2.0000 g | Freq: Four times a day (QID) | INTRAVENOUS | Status: AC
  Administered 2015-09-13 (×2): 2 g via INTRAVENOUS
  Filled 2015-09-13 (×2): qty 50

## 2015-09-13 MED ORDER — LACTATED RINGERS IV SOLN
INTRAVENOUS | Status: DC
  Administered 2015-09-13: 12:00:00 via INTRAVENOUS
  Administered 2015-09-13: 1000 mL via INTRAVENOUS

## 2015-09-13 MED ORDER — METOCLOPRAMIDE HCL 10 MG PO TABS: 5.0000 mg | ORAL_TABLET | Freq: Three times a day (TID) | ORAL | Status: DC | PRN

## 2015-09-13 MED ORDER — WARFARIN - PHYSICIAN DOSING INPATIENT
Freq: Every day | Status: DC
  Administered 2015-09-14 – 2015-09-15 (×2)

## 2015-09-13 MED ORDER — MORPHINE SULFATE (PF) 2 MG/ML IV SOLN
0.5000 mg | INTRAVENOUS | Status: DC | PRN
  Administered 2015-09-13: 0.5 mg via INTRAVENOUS
  Filled 2015-09-13: qty 1

## 2015-09-13 MED ORDER — FENTANYL CITRATE (PF) 100 MCG/2ML IJ SOLN: 25.0000 ug | INTRAMUSCULAR | Status: DC | PRN

## 2015-09-13 SURGICAL SUPPLY — 53 items
BAG HAMPER (MISCELLANEOUS) ×2 IMPLANT
BIT DRILL AO GAMMA 4.2X230 (BIT) IMPLANT
BIT DRILL AO GAMMA 4.2X300 (BIT) ×4 IMPLANT
BLADE HEX COATED 2.75 (ELECTRODE) ×2 IMPLANT
BLADE SURG SZ10 CARB STEEL (BLADE) ×4 IMPLANT
BNDG GAUZE ELAST 4 BULKY (GAUZE/BANDAGES/DRESSINGS) ×2 IMPLANT
CHLORAPREP W/TINT 26ML (MISCELLANEOUS) ×2 IMPLANT
CLOTH BEACON ORANGE TIMEOUT ST (SAFETY) ×2 IMPLANT
COVER LIGHT HANDLE STERIS (MISCELLANEOUS) ×4 IMPLANT
COVER MAYO STAND XLG (DRAPE) ×2 IMPLANT
DECANTER SPIKE VIAL GLASS SM (MISCELLANEOUS) ×4 IMPLANT
DRAPE STERI IOBAN 125X83 (DRAPES) ×2 IMPLANT
DRSG AQUACEL AG ADV 3.5X10 (GAUZE/BANDAGES/DRESSINGS) ×2 IMPLANT
DRSG MEPILEX BORDER 4X12 (GAUZE/BANDAGES/DRESSINGS) ×4 IMPLANT
ELECT REM PT RETURN 9FT ADLT (ELECTROSURGICAL) ×2
ELECTRODE REM PT RTRN 9FT ADLT (ELECTROSURGICAL) ×1 IMPLANT
GLOVE BIOGEL M 7.0 STRL (GLOVE) ×4 IMPLANT
GLOVE BIOGEL PI IND STRL 7.0 (GLOVE) ×3 IMPLANT
GLOVE BIOGEL PI INDICATOR 7.0 (GLOVE) ×3
GLOVE EXAM NITRILE MD LF STRL (GLOVE) ×2 IMPLANT
GLOVE SKINSENSE NS SZ8.0 LF (GLOVE) ×1
GLOVE SKINSENSE STRL SZ8.0 LF (GLOVE) ×1 IMPLANT
GLOVE SS N UNI LF 8.5 STRL (GLOVE) ×2 IMPLANT
GOWN STRL REUS W/TWL LRG LVL3 (GOWN DISPOSABLE) ×8 IMPLANT
GOWN STRL REUS W/TWL XL LVL3 (GOWN DISPOSABLE) ×2 IMPLANT
GUIDEROD T2 3X1000 (ROD) ×2 IMPLANT
INST SET MAJOR BONE (KITS) ×2 IMPLANT
K-WIRE  3.2X450M STR (WIRE) ×1
K-WIRE 3.2X450M STR (WIRE) ×1
KIT BLADEGUARD II DBL (SET/KITS/TRAYS/PACK) ×2 IMPLANT
KIT ROOM TURNOVER AP CYSTO (KITS) ×2 IMPLANT
KWIRE 3.2X450M STR (WIRE) ×1 IMPLANT
MANIFOLD NEPTUNE II (INSTRUMENTS) ×2 IMPLANT
MARKER SKIN DUAL TIP RULER LAB (MISCELLANEOUS) ×2 IMPLANT
NAIL TROCH GAMMA 11X18 (Nail) ×2 IMPLANT
NEEDLE HYPO 21X1.5 SAFETY (NEEDLE) ×2 IMPLANT
NEEDLE SPNL 18GX3.5 QUINCKE PK (NEEDLE) ×2 IMPLANT
NS IRRIG 1000ML POUR BTL (IV SOLUTION) ×2 IMPLANT
PACK BASIC III (CUSTOM PROCEDURE TRAY) ×1
PACK SRG BSC III STRL LF ECLPS (CUSTOM PROCEDURE TRAY) ×1 IMPLANT
PENCIL HANDSWITCHING (ELECTRODE) ×2 IMPLANT
SCREW LAG GAMMA 3 110MM (Screw) ×2 IMPLANT
SCREW LOCKING T2 F/T  5MMX45MM (Screw) ×1 IMPLANT
SCREW LOCKING T2 F/T 5MMX45MM (Screw) ×1 IMPLANT
SET BASIN LINEN APH (SET/KITS/TRAYS/PACK) ×2 IMPLANT
SPONGE LAP 18X18 X RAY DECT (DISPOSABLE) ×4 IMPLANT
STAPLER VISISTAT 35W (STAPLE) ×2 IMPLANT
SUT MNCRL 0 VIOLET CTX 36 (SUTURE) ×1 IMPLANT
SUT MON AB 2-0 CT1 36 (SUTURE) ×2 IMPLANT
SUT MONOCRYL 0 CTX 36 (SUTURE) ×1
SYR 30ML LL (SYRINGE) ×2 IMPLANT
SYR BULB IRRIGATION 50ML (SYRINGE) ×4 IMPLANT
YANKAUER SUCT 12FT TUBE ARGYLE (SUCTIONS) ×2 IMPLANT

## 2015-09-13 NOTE — Progress Notes (Signed)
Pt arrived to unit. VSS. Pt in no distress/pain. RN will continue to monitor. Lesly Dukesachel J Everett, RN

## 2015-09-13 NOTE — Anesthesia Preprocedure Evaluation (Signed)
Anesthesia Evaluation  Patient identified by MRN, date of birth, ID band Patient awake    Reviewed: Allergy & Precautions, NPO status , Patient's Chart, lab work & pertinent test results  Airway Mallampati: I  TM Distance: >3 FB Neck ROM: Full    Dental  (+) Teeth Intact   Pulmonary former smoker,    breath sounds clear to auscultation       Cardiovascular (-) angina+ CAD and + Cardiac Stents   Rhythm:Regular Rate:Normal     Neuro/Psych    GI/Hepatic GERD  Medicated,  Endo/Other    Renal/GU      Musculoskeletal   Abdominal   Peds  Hematology   Anesthesia Other Findings   Reproductive/Obstetrics                             Anesthesia Physical Anesthesia Plan  ASA: III  Anesthesia Plan: Spinal   Post-op Pain Management:    Induction: Intravenous  Airway Management Planned: Simple Face Mask  Additional Equipment:   Intra-op Plan:   Post-operative Plan:   Informed Consent: I have reviewed the patients History and Physical, chart, labs and discussed the procedure including the risks, benefits and alternatives for the proposed anesthesia with the patient or authorized representative who has indicated his/her understanding and acceptance.     Plan Discussed with:   Anesthesia Plan Comments:         Anesthesia Quick Evaluation

## 2015-09-13 NOTE — Transfer of Care (Signed)
Immediate Anesthesia Transfer of Care Note  Patient: Jeffery Goodwin  Procedure(s) Performed: Procedure(s): INTRAMEDULLARY  NAIL INTERTROCHANTRIC (Left)  Patient Location: PACU  Anesthesia Type:Spinal  Level of Consciousness: awake and patient cooperative  Airway & Oxygen Therapy: Patient Spontanous Breathing and Patient connected to nasal cannula oxygen  Post-op Assessment: Report given to RN and Post -op Vital signs reviewed and stable  Post vital signs: Reviewed and stable    Complications: No apparent anesthesia complications

## 2015-09-13 NOTE — Anesthesia Postprocedure Evaluation (Addendum)
Anesthesia Post Note  Patient: Jeffery Goodwin  Procedure(s) Performed: Procedure(s) (LRB): INTRAMEDULLARY  NAIL INTERTROCHANTRIC (Left)  Patient location during evaluation: PACU Anesthesia Type: Spinal Level of consciousness: awake and patient cooperative Pain management: pain level controlled Vital Signs Assessment: post-procedure vital signs reviewed and stable Respiratory status: spontaneous breathing and patient connected to nasal cannula oxygen Cardiovascular status: stable Anesthetic complications: no Comments: T 6 level    Last Vitals:  Filed Vitals:   09/13/15 1400 09/13/15 1415  BP: 125/72 114/70  Pulse: 59 54  Temp: 36.3 C   Resp: 13 14    Last Pain:  Filed Vitals:   09/13/15 1416  PainSc: 3                  Deloise Marchant

## 2015-09-13 NOTE — Op Note (Signed)
Operative report  Today's date is 09/13/2015  The surgery was performed at approximately 1:15 PM in the approximate 2 PM  Preop diagnosis closed intertrochanteric fracture left hip  Postop diagnosis same Procedure open treatment internal fixation left hip with short gamma nail  Implants Stryker gamma nail, 125 short nail with 110 mm lag screw, 45 mm distal locking screw, acorn was placed in the sliding mode.  I was assisted by [place] NationBetty Ashley  Anesthesia was by spinal technique  Operative findings two-part intertrochanteric fracture left hip with intact medial posterior buttress  The patient was identified in the preoperative holding area the left hip was marked as a surgical site. Chart review was completed including signing of consent  Vital signs are stable patient was taken to the operating room for spinal anesthesia. Successful unconjugated spinal anesthesia was administered by the anesthesia department the patient was placed supine on the fracture table with appropriate padding with the right leg in abduction flexion and external rotation.  The left leg was placed in traction  The C-arm was brought in and a closed reduction was obtained with traction and internal rotation until an anatomic stable reduction was achieved  The iliac crest down to the knee was prepped and draped sterilely and timeout was executed confirming surgical site, I checked the implants prior and x-rays were visible and available.  We started the incision over the greater trochanter and carried it proximally and deepened it down to the fascia which was split in line with the skin incision. Electrocautery was used to control bleeding. The fascia was then explored with finger dissection down to the greater trochanter and a radiograph confirmed that the curved awl was in the proper position. All was used appears to trochanter and was passed down to the femoral canal and checked on AP and lateral x-rays. A guidewire  was then passed down to the knee confirmed by x-ray.  The proximal reamer was then passed over the guidewire. The nail was passed over the guidewire. Once it was in good position the wire was removed.  A lateral incision was made for the lag screw. This incision was taken down to bone. The cannula was advanced down to bone and then the threaded tip guidewire was placed in the inferior femoral neck center femoral head on the AP view and then center center on the lateral view.  This measured 108 mm. The triple reamer was set for 110 mm. This was passed over the guidewire. 110 mm lag screw was then placed in the appropriate tip to apex position confirmed on AP and lateral x-ray  We then irrigated the proximal portion of the seating arm of the nail and then past the acorn and confirmed its position on x-ray checking its engagement by twisting the screw handle distally. We put the acorn in sliding position to allow sliding in compression of the fracture  A second stab wound was placed and the distal locking screw cannula was advanced to bone under C-arm guidance over drilling was performed and a 45 mm screw was passed. Final x-rays confirmed adequate reduction and hardware position  All wounds were thirst thoroughly irrigated. The proximal wound was closed in 2 layers with 0 Monocryl and 2-0 Monocryl injecting each layer with 30 mL of Marcaine with epinephrine  The first stab wound was closed with 2-0 Monocryl and staples this second stab wound was closed with staples and the proximal wound was reapproximated with staples  A sterile bandage was applied  The  patient was then taken to recovery room in stable condition  The postoperative plan is for the patient be weightbearing as tolerated. The patient gets a stomachache with aspirin over 81 mg so we will use low-dose warfarin non-therapeutic 2.5 mg daily

## 2015-09-13 NOTE — Care Management (Signed)
Spoke with patient who is alert orient from home with spouse. Will continue to follow for discharge needs.

## 2015-09-13 NOTE — Progress Notes (Signed)
Subjective: He says he feels okay. He is complaining of pain in his hip. He has no other new complaints. No chest pain. He had cardiac evaluation recently and was approved for hernia surgery. He has not had any chest pain. He had a fairly recent stress test and has been exercising at least an hour every day.  Objective: Vital signs in last 24 hours: Temp:  [97.9 F (36.6 C)-98.7 F (37.1 C)] 98.7 F (37.1 C) (01/10 0553) Pulse Rate:  [50-55] 53 (01/10 0553) Resp:  [13-20] 18 (01/10 0553) BP: (112-148)/(58-93) 113/63 mmHg (01/10 0553) SpO2:  [97 %-100 %] 100 % (01/10 0553) Weight:  [92.987 kg (205 lb)-97.75 kg (215 lb 8 oz)] 97.75 kg (215 lb 8 oz) (01/09 1935) Weight change:  Last BM Date: 09/12/15  Intake/Output from previous day: 01/09 0701 - 01/10 0700 In: 480 [P.O.:480] Out: 2100 [Urine:2100]  PHYSICAL EXAM General appearance: alert, cooperative and mild distress Resp: clear to auscultation bilaterally Cardio: regular rate and rhythm, S1, S2 normal, no murmur, click, rub or gallop GI: soft, non-tender; bowel sounds normal; no masses,  no organomegaly Extremities: No edema  Lab Results:  Results for orders placed or performed during the hospital encounter of 09/12/15 (from the past 48 hour(s))  CBC with Differential     Status: None   Collection Time: 09/12/15  3:10 PM  Result Value Ref Range   WBC 6.2 4.0 - 10.5 K/uL   RBC 4.56 4.22 - 5.81 MIL/uL   Hemoglobin 14.2 13.0 - 17.0 g/dL   HCT 42.2 39.0 - 52.0 %   MCV 92.5 78.0 - 100.0 fL   MCH 31.1 26.0 - 34.0 pg   MCHC 33.6 30.0 - 36.0 g/dL   RDW 12.2 11.5 - 15.5 %   Platelets 180 150 - 400 K/uL   Neutrophils Relative % 54 %   Neutro Abs 3.4 1.7 - 7.7 K/uL   Lymphocytes Relative 30 %   Lymphs Abs 1.9 0.7 - 4.0 K/uL   Monocytes Relative 9 %   Monocytes Absolute 0.6 0.1 - 1.0 K/uL   Eosinophils Relative 6 %   Eosinophils Absolute 0.4 0.0 - 0.7 K/uL   Basophils Relative 1 %   Basophils Absolute 0.0 0.0 - 0.1 K/uL   Basic metabolic panel     Status: Abnormal   Collection Time: 09/12/15  3:10 PM  Result Value Ref Range   Sodium 139 135 - 145 mmol/L   Potassium 4.4 3.5 - 5.1 mmol/L   Chloride 106 101 - 111 mmol/L   CO2 26 22 - 32 mmol/L   Glucose, Bld 104 (H) 65 - 99 mg/dL   BUN 26 (H) 6 - 20 mg/dL   Creatinine, Ser 1.08 0.61 - 1.24 mg/dL   Calcium 9.2 8.9 - 10.3 mg/dL   GFR calc non Af Amer >60 >60 mL/min   GFR calc Af Amer >60 >60 mL/min    Comment: (NOTE) The eGFR has been calculated using the CKD EPI equation. This calculation has not been validated in all clinical situations. eGFR's persistently <60 mL/min signify possible Chronic Kidney Disease.    Anion gap 7 5 - 15  Urinalysis, Routine w reflex microscopic (not at [facility])     Status: Abnormal   Collection Time: 09/12/15  6:09 PM  Result Value Ref Range   Color, Urine YELLOW YELLOW   APPearance CLEAR CLEAR   Specific Gravity, Urine 1.025 1.005 - 1.030   pH 5.5 5.0 - 8.0   Glucose, UA NEGATIVE  NEGATIVE mg/dL   Hgb urine dipstick NEGATIVE NEGATIVE   Bilirubin Urine NEGATIVE NEGATIVE   Ketones, ur TRACE (A) NEGATIVE mg/dL   Protein, ur NEGATIVE NEGATIVE mg/dL   Nitrite NEGATIVE NEGATIVE   Leukocytes, UA NEGATIVE NEGATIVE    Comment: MICROSCOPIC NOT DONE ON URINES WITH NEGATIVE PROTEIN, BLOOD, LEUKOCYTES, NITRITE, OR GLUCOSE <1000 mg/dL.  Prepare RBC     Status: None   Collection Time: 09/12/15  8:00 PM  Result Value Ref Range   Order Confirmation ORDER PROCESSED BY BLOOD BANK   Type and screen [facility]     Status: None (Preliminary result)   Collection Time: 09/12/15  8:00 PM  Result Value Ref Range   ABO/RH(D) O POS    Antibody Screen NEG    Sample Expiration 09/15/2015    Unit Number [id number]    Blood Component Type RED CELLS,LR    Unit division 00    Status of Unit ALLOCATED    Transfusion Status OK TO TRANSFUSE    Crossmatch Result Compatible    Unit Number [id number]    Blood Component Type RED  CELLS,LR    Unit division 00    Status of Unit ALLOCATED    Transfusion Status OK TO TRANSFUSE    Crossmatch Result Compatible   ABO/Rh     Status: None   Collection Time: 09/12/15  8:00 PM  Result Value Ref Range   ABO/RH(D) O POS   Surgical pcr screen     Status: Abnormal   Collection Time: 09/12/15  9:30 PM  Result Value Ref Range   MRSA, PCR NEGATIVE NEGATIVE   Staphylococcus aureus POSITIVE (A) NEGATIVE    Comment:        The Xpert SA Assay (FDA approved for NASAL specimens in patients over 18 years of age), is one component of a comprehensive surveillance program.  Test performance has been validated by [facility] for patients greater than or equal to 30 year old. It is not intended to diagnose infection nor to guide or monitor treatment. RESULT CALLED TO, READ BACK BY AND VERIFIED WITH: WILSON A AT 0342 ON [id number] BY FORSYTH K   Protime-INR     Status: None   Collection Time: 09/13/15  6:09 AM  Result Value Ref Range   Prothrombin Time 13.7 11.6 - 15.2 seconds   INR 1.03 0.00 - 1.49    ABGS No results for input(s): PHART, PO2ART, TCO2, HCO3 in the last 72 hours.  Invalid input(s): PCO2 CULTURES Recent Results (from the past 240 hour(s))  Surgical pcr screen     Status: Abnormal   Collection Time: 09/12/15  9:30 PM  Result Value Ref Range Status   MRSA, PCR NEGATIVE NEGATIVE Final   Staphylococcus aureus POSITIVE (A) NEGATIVE Final    Comment:        The Xpert SA Assay (FDA approved for NASAL specimens in patients over 94 years of age), is one component of a comprehensive surveillance program.  Test performance has been validated by [facility] for patients greater than or equal to 10 year old. It is not intended to diagnose infection nor to guide or monitor treatment. RESULT CALLED TO, READ BACK BY AND VERIFIED WITH: WILSON A AT 0342 ON [id number] BY FORSYTH K    Studies/Results: Dg Chest 1 View  09/12/2015  CLINICAL DATA:  Pt fell on the ice today  EXAM: CHEST 1 VIEW COMPARISON:  None. FINDINGS: The heart size and mediastinal contours are within normal limits. Both  lungs are clear. The visualized skeletal structures are unremarkable. IMPRESSION: No active disease. Electronically Signed   By: Rolm Baptise M.D.   On: 09/12/2015 16:18   Dg Hip Unilat With Pelvis Min 4 Views Left  09/12/2015  CLINICAL DATA:  [place] on ice today EXAM: DG HIP (WITH OR WITHOUT PELVIS) 4+V LEFT COMPARISON:  None. FINDINGS: Three views of the left hip submitted. There is displaced intertrochanteric fracture of proximal left femur. IMPRESSION: Displaced intertrochanteric fracture of proximal left femur. Electronically Signed   By: Lahoma Crocker M.D.   On: 09/12/2015 16:18    Medications:  Prior to Admission:  Prescriptions prior to admission  Medication Sig Dispense Refill Last Dose  . aspirin 81 MG chewable tablet Chew 81 mg by mouth daily.    09/12/2015 at Unknown time  . CINNAMON PO Take 1 each by mouth daily. Take 1 spoonful with yogurt daily   09/12/2015 at Unknown time  . COCONUT OIL PO Take 1 each by mouth daily. Take 1 spoonful by mouth daily   09/12/2015 at Unknown time  . Cyanocobalamin (B-12) 3000 MCG CAPS Take 1 capsule by mouth daily.   09/12/2015 at Unknown time  . Flaxseed, Linseed, (FLAX SEEDS PO) Take 1 each by mouth daily. Take 1 spoonful with yogurt daily   09/12/2015 at Unknown time  . KRILL OIL PO Take 750 mg by mouth daily.   09/12/2015 at Unknown time  . Misc Natural Products (GLUCOS-CHONDROIT-MSM COMPLEX PO) Take 3 tablets by mouth every morning.    09/12/2015 at Unknown time  . Omega-3 Fatty Acids (FISH OIL) 1200 MG CAPS Take 3,600 mg by mouth daily.   09/12/2015 at Unknown time  . pantoprazole (PROTONIX) 40 MG tablet Take 40 mg by mouth daily.   09/12/2015 at Unknown time  . propranolol (INDERAL) 10 MG tablet Take 5-10 mg by mouth daily.    09/12/2015 at 1300  . Saw Palmetto 450 MG CAPS Take 450 mg by mouth daily.   09/12/2015 at Unknown time  . Tetrahydrozoline HCl  (VISINE OP) Place 1 drop into both eyes 2 (two) times daily.   09/12/2015 at Unknown time  . vitamin C (ASCORBIC ACID) 500 MG tablet Take 500 mg by mouth daily.   09/12/2015 at Unknown time   Scheduled: . aspirin  81 mg Oral Daily  .  ceFAZolin (ANCEF) IV  2 g Intravenous On Call to OR  . chlorhexidine  60 mL Topical Once  . naphazoline-glycerin  1 drop Both Eyes BID  . pantoprazole  40 mg Oral Daily  . propranolol  5 mg Oral BID  . vitamin C  500 mg Oral Daily   Continuous:  [id number], morphine injection, polyethylene glycol  Assesment: He has an intertrochanteric fracture of the left hip. He is scheduled for surgery today. He is cleared medically for that surgery. He has a cardiac condition but has done exceptionally well. He had recent surgery on his neck and is being scheduled for surgery for hernia and has been cleared for that so I think he is okay from a cardiac standpoint for planned hip surgery as well. Active Problems:   Coronary atherosclerosis of native coronary artery   Hip fracture (HCC)   Intertrochanteric fracture of left hip (HCC)    Plan: Proceed with surgery today    LOS: 1 day   Allanna Bresee L 09/13/2015, 8:42 AM

## 2015-09-13 NOTE — Care Management Important Message (Signed)
Important Message  Patient Details  Name: Jeffery Goodwin MRN: [id number] Date of Birth: 01/24/1951   Medicare Important Message Given:  Yes    Adonis HugueninBerkhead, Hooria Gasparini L, RN 09/13/2015, 10:16 AM

## 2015-09-13 NOTE — Progress Notes (Signed)
Pt complained on pain at a level 10. RN gave all PRN medications are appropriate times. PT still complained of pain at a level 10.  RN notified MD and made aware.  MD gave RN verbal orders to place in mar. RN read back verbal orders and MD verified. RN pass on the information to oncoming nurse. Lesly Dukesachel J Everett, RN

## 2015-09-13 NOTE — Anesthesia Procedure Notes (Addendum)
Procedure Name: MAC Date/Time: 09/13/2015 12:17 PM Performed by: Franco NonesYATES, Rosamae Rocque S Pre-anesthesia Checklist: Patient identified, Emergency Drugs available, Suction available, Timeout performed and Patient being monitored Patient Re-evaluated:Patient Re-evaluated prior to inductionOxygen Delivery Method: Nasal Cannula    Procedure Name: MAC Date/Time: 09/13/2015 12:30 PM Performed by: Franco NonesYATES, Leathie Weich S Pre-anesthesia Checklist: Patient identified, Emergency Drugs available, Suction available, Timeout performed and Patient being monitored Patient Re-evaluated:Patient Re-evaluated prior to inductionOxygen Delivery Method: Non-rebreather mask    Spinal Patient location during procedure: OR Start time: 09/13/2015 12:30 PM End time: 09/13/2015 12:39 PM Staffing Resident/CRNA: Franco NonesYATES, Marra Fraga S Preanesthetic Checklist Completed: patient identified, site marked, surgical consent, pre-op evaluation, timeout performed, IV checked, risks and benefits discussed and monitors and equipment checked Spinal Block Patient position: left lateral decubitus Prep: Betadine Patient monitoring: heart rate, cardiac monitor, continuous pulse ox and blood pressure Approach: left paramedian Location: L3-4 Injection technique: single-shot Needle Needle type: Spinocan  Needle gauge: 22 G Needle length: 9 cm Assessment Sensory level: T6 Additional Notes Betadine prep x3 1% lidocaine skinwheal  Clear CSF pre and post injection  ATTEMPTS: 2 TRAY ID: [id number] TRAY EXPIRATION DATE: 2017-08

## 2015-09-13 NOTE — Brief Op Note (Signed)
09/12/2015 - 09/13/2015  1:50 PM  PATIENT:  Jeffery Goodwin  65 y.o. male  PRE-OPERATIVE DIAGNOSIS:  left intertrochanteric hip fracture  POST-OPERATIVE DIAGNOSIS:  left intertrochanteric hip fracture  PROCEDURE:  Procedure(s): INTRAMEDULLARY  NAIL INTERTROCHANTRIC (Left)   125 degree short 110 lag screw 45 distal screw acorn slide mode   SURGEON:  Surgeon(s) and Role:    * Vickki HearingStanley E Harrison, MD - Primary  PHYSICIAN ASSISTANT:   ASSISTANTS: betty ashley    ANESTHESIA:   spinal  EBL:  Total I/O In: -  Out: 325 [Urine:250; Blood:75]  BLOOD ADMINISTERED:none  DRAINS: none and Gastrostomy Tube   LOCAL MEDICATIONS USED:  MARCAINE     SPECIMEN:  No Specimen  DISPOSITION OF SPECIMEN:  N/A  COUNTS:  YES  TOURNIQUET:  * No tourniquets in log *  DICTATION: .Dragon Dictation  PLAN OF CARE: Admit to inpatient   PATIENT DISPOSITION:  PACU - hemodynamically stable.   Delay start of Pharmacological VTE agent (>24hrs) due to surgical blood loss or risk of bleeding: yes

## 2015-09-14 ENCOUNTER — Encounter (HOSPITAL_COMMUNITY): Payer: Self-pay | Admitting: Orthopedic Surgery

## 2015-09-14 LAB — BASIC METABOLIC PANEL
Anion gap: 6 (ref 5–15)
BUN: 13 mg/dL (ref 6–20)
CO2: 29 mmol/L (ref 22–32)
Calcium: 8.5 mg/dL — ABNORMAL LOW (ref 8.9–10.3)
Chloride: 103 mmol/L (ref 101–111)
Creatinine, Ser: 0.94 mg/dL (ref 0.61–1.24)
GFR calc Af Amer: >60 mL/min (ref >60)
GFR calc non Af Amer: >60 mL/min (ref >60)
Glucose, Bld: 103 mg/dL — ABNORMAL HIGH (ref 65–99)
Potassium: 4.1 mmol/L (ref 3.5–5.1)
Sodium: 138 mmol/L (ref 135–145)

## 2015-09-14 LAB — CBC
HCT: 34.8 % — ABNORMAL LOW (ref 39.0–52.0)
Hemoglobin: 11.7 g/dL — ABNORMAL LOW (ref 13.0–17.0)
MCH: 31.5 pg (ref 26.0–34.0)
MCHC: 33.6 g/dL (ref 30.0–36.0)
MCV: 93.5 fL (ref 78.0–100.0)
Platelets: 135 10*3/uL — ABNORMAL LOW (ref 150–400)
RBC: 3.72 MIL/uL — ABNORMAL LOW (ref 4.22–5.81)
RDW: 12.2 % (ref 11.5–15.5)
WBC: 6.2 10*3/uL (ref 4.0–10.5)

## 2015-09-14 LAB — PROTIME-INR
INR: 1.18 (ref 0.00–1.49)
Prothrombin Time: 15.2 s (ref 11.6–15.2)

## 2015-09-14 MED ORDER — KETOROLAC TROMETHAMINE 30 MG/ML IJ SOLN
30.0000 mg | Freq: Four times a day (QID) | INTRAMUSCULAR | Status: AC
  Administered 2015-09-14 – 2015-09-15 (×4): 30 mg via INTRAVENOUS
  Filled 2015-09-14 (×4): qty 1

## 2015-09-14 MED ORDER — OXYCODONE-ACETAMINOPHEN 5-325 MG PO TABS
1.0000 | ORAL_TABLET | ORAL | Status: DC
  Administered 2015-09-14 – 2015-09-16 (×13): 1 via ORAL
  Filled 2015-09-14 (×13): qty 1

## 2015-09-14 NOTE — Evaluation (Signed)
Occupational Therapy Evaluation Patient Details Name: Jeffery Goodwin MRN: [id number] DOB: 09/03/1950 Today's Date: 09/14/2015    History of Present Illness 65 year old male who has a past medical history of Tremor; Carpal tunnel syndrome; Arthritis; GERD (gastroesophageal reflux disease); Dry eyes; Coronary atherosclerosis of native coronary artery; Hyperlipidemia; Neck pain; Nocturia; and History of shingles. Pt had intramedullary nail intertrochantric on 09/13/15.     Clinical Impression   Pt awake, alert, oriented x4 this am, reports moderate pain in the hip. PTA pt very active, bikes, swims, and weight exercises daily. Pt lives with wife who will be present to assist with ADL tasks 24/7 on discharge. Pt able to complete bed mobility with increased time this am, sit-stand transfer with min guard. Pt will require assistance with ADL tasks in the immediate future, wife will be present to assist. No further acute OT services required at this time.     Follow Up Recommendations  No OT follow up;Supervision/Assistance - 24 hour    Equipment Recommendations  None recommended by OT       Precautions / Restrictions Precautions Precautions: Fall Restrictions Weight Bearing Restrictions: No (WBAT)      Mobility Bed Mobility Overal bed mobility: Modified Independent             General bed mobility comments: Pt is able to complete bed mobility, slow and labored  Transfers Overall transfer level: Needs assistance Equipment used: Rolling walker (2 wheeled) Transfers: Sit to/from Stand Sit to Stand: Min guard                   ADL Overall ADL's : Needs assistance/impaired                                       General ADL Comments: Pt requires assistance with LB ADL tasks and set-up for seated tasks due to pain in the left hip     Vision Vision Assessment?: No apparent visual deficits          Pertinent Vitals/Pain Pain Assessment: 0-10 Pain Score:  7  Pain Location: left hip Pain Descriptors / Indicators: Aching Pain Intervention(s): Limited activity within patient's tolerance;Monitored during session     Hand Dominance Right   Extremity/Trunk Assessment Upper Extremity Assessment Upper Extremity Assessment: Overall WFL for tasks assessed   Lower Extremity Assessment Lower Extremity Assessment: Defer to PT evaluation       Communication Communication Communication: No difficulties   Cognition Arousal/Alertness: Awake/alert Behavior During Therapy: WFL for tasks assessed/performed Overall Cognitive Status: Within Functional Limits for tasks assessed                                Home Living Family/patient expects to be discharged to:: Private residence Living Arrangements: Spouse/significant other Available Help at Discharge: Family;Available 24 hours/day                         Home Equipment: None          Prior Functioning/Environment Level of Independence: Independent             OT Diagnosis: Acute pain   OT Problem List: Pain    End of Session Equipment Utilized During Treatment: Gait belt;Rolling walker  Activity Tolerance: Patient tolerated treatment well Patient left: in bed;with call bell/phone within reach  Time: 4098-1191 OT Time Calculation (min): 18 min Charges:  OT General Charges $OT Visit: 1 Procedure OT Evaluation $OT Eval Low Complexity: 1 Procedure  Ezra Sites, OTR/L  [phone]  09/14/2015, 9:08 AM

## 2015-09-14 NOTE — Plan of Care (Signed)
Problem: Acute Rehab PT Goals(only PT should resolve) Goal: Pt Will Ambulate Pt will ambulate with RW at Supervision using a step-through pattern and equal step length for a distances greater than 26300ft to demonstrate the ability to perform safe household distance ambulation at discharge.    Goal: Pt Will Go Up/Down Stairs Pt will ascend/descend 6 stairs with LRAD and 1 HR at Supervision to demonstrate safe entry/exit of home.

## 2015-09-14 NOTE — Evaluation (Signed)
Physical Therapy Evaluation Patient Details Name: Jeffery Goodwin MRN: [id number] DOB: 02/08/1951 Today's Date: 09/14/2015   History of Present Illness  65yo white male who sustained a fall onto icy driveway after exiting vehicle and sustained a left hip fx. Pt presenting on POD1 p L hip OIRF. Pt previously very health and active 30 years of running, cycling, swimming etc. 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   Clinical Impression  Pt is received semirecumbent in bed upon entry, awake, alert, and willing to participate. No acute distress noted, aside from post surgical pain. Pt is A&Ox3 and pleasant. Pt reports zero falls in the last 6 months, aside from most recent one 2 days ago. Strength is globally WNL, with exception to the surgical side, with patent postop pain and weakness, however pt is modified indep with most mobility this morning, and able to self assist for completion of all HEP education. Pt falls risk is high as evidenced by slow gait speed, poor forward reach, and altered posturing during transfers and mobility. Pt remaining on room air throughout evaluation, vitals stable and within safe limites. Pt noted to be bradycardic, however this is his baseline and is likely artifact of extensive cycling and swimming training.  Patient presenting with impairment of strength, range of motion, balance, oxygen perfusion, and activity tolerance, limiting ability to perform ADL and mobility tasks at  baseline level of function. Patient will benefit from skilled intervention to address the above impairments and limitations, in order to restore to prior level of function, improve patient safety upon discharge, and to decrease falls risk. PT recommends DC to home with assistance PRN from wife, and will need HHPT to address aforementioned deficits.      Follow Up Recommendations Home health PT    Equipment Recommendations  Rolling walker with 5" wheels     Recommendations for Other Services       Precautions / Restrictions Precautions Precautions: Fall Restrictions Weight Bearing Restrictions: Yes LLE Weight Bearing: Weight bearing as tolerated      Mobility  Bed Mobility Overal bed mobility: Modified Independent             General bed mobility comments: Pt is able to complete bed mobility, slow and labored  Transfers Overall transfer level: Modified independent Equipment used: Rolling walker (2 wheeled) Transfers: Sit to/from Stand Sit to Stand: Supervision         General transfer comment: heavy verbal cues for DME education and safe use.   Ambulation/Gait Ambulation/Gait assistance: Min guard Ambulation Distance (Feet): 60 Feet Assistive device: Rolling walker (2 wheeled)     Gait velocity interpretation: <1.8 ft/sec, indicative of risk for recurrent falls General Gait Details: slow labored, and antalgic; heavy use of RW to perform.   Stairs            Wheelchair Mobility    Modified Rankin (Stroke Patients Only)       Balance Overall balance assessment: Modified Independent                                           Pertinent Vitals/Pain Pain Assessment: 0-10 Pain Score: 8  (end of session) Pain Location: L hip Pain Intervention(s): Limited activity within patient's tolerance;Premedicated before session;Monitored during session;Patient requesting pain meds-RN notified;Repositioned    Home Living Family/patient expects to be discharged to:: Private residence  Living Arrangements: Spouse/significant other Available Help at Discharge: Family;Available 24 hours/day Type of Home: House Home Access: Stairs to enter Entrance Stairs-Rails: Can reach both Entrance Stairs-Number of Steps: 3 Home Layout: Two level;Able to live on main level with bedroom/bathroom Home Equipment: None      Prior Function Level of Independence: Independent               Hand Dominance    Dominant Hand: Right    Extremity/Trunk Assessment   Upper Extremity Assessment: Overall WFL for tasks assessed           Lower Extremity Assessment: Overall WFL for tasks assessed;LLE deficits/detail   LLE Deficits / Details: Postoperative pain,s welling, and weakness; requires mod assist to perform HEP.   Cervical / Trunk Assessment: Normal  Communication   Communication: No difficulties  Cognition Arousal/Alertness: Awake/alert Behavior During Therapy: WFL for tasks assessed/performed Overall Cognitive Status: Within Functional Limits for tasks assessed                      General Comments      Exercises Total Joint Exercises Bridges: AROM;10 reps;Both;Supine General Exercises - Lower Extremity Ankle Circles/Pumps: AROM;Both;20 reps;Supine Short Arc Quad: AROM;Left;10 reps;Supine Heel Slides: AAROM;Left;10 reps;Supine Hip ABduction/ADduction: AAROM;Left;10 reps;Supine Straight Leg Raises: AAROM;Left;10 reps;Supine      Assessment/Plan    PT Assessment Patient needs continued PT services  PT Diagnosis Difficulty walking;Abnormality of gait;Acute pain   PT Problem List Decreased strength;Decreased range of motion;Decreased activity tolerance;Decreased balance;Decreased mobility;Decreased coordination;Decreased cognition;Decreased knowledge of use of DME;Decreased safety awareness;Pain  PT Treatment Interventions DME instruction;Gait training;Stair training;Functional mobility training;Therapeutic activities;Therapeutic exercise;Balance training;Patient/family education   PT Goals (Current goals can be found in the Care Plan section) Acute Rehab PT Goals Patient Stated Goal: Return to regular swimming and cycling workouts.  PT Goal Formulation: With patient Time For Goal Achievement: 09/28/15 Potential to Achieve Goals: Poor    Frequency BID   Barriers to discharge        Co-evaluation               End of Session Equipment Utilized During  Treatment: Gait belt Activity Tolerance: Patient tolerated treatment well;Patient limited by pain Patient left: in chair;with call bell/phone within reach;with family/visitor present Nurse Communication: Mobility status;Other (comment);Precautions;Patient requests pain meds         Time: 1014-1100 PT Time Calculation (min) (ACUTE ONLY): 46 min   Charges:   PT Evaluation $PT Eval Low Complexity: 1 Procedure PT Treatments $Therapeutic Exercise: 8-22 mins $Therapeutic Activity: 8-22 mins   PT G Codes:        Kamira Mellette C 2015/09/22, 12:43 PM 12:50 PM  Rosamaria Lints, PT, DPT  License # [id number]

## 2015-09-14 NOTE — Anesthesia Postprocedure Evaluation (Signed)
Anesthesia Post Note  Patient: Jeffery SquibbJoseph W Granade  Procedure(s) Performed: Procedure(s) (LRB): INTRAMEDULLARY  NAIL INTERTROCHANTRIC (Left)  Patient location during evaluation: Nursing Unit Anesthesia Type: Spinal Level of consciousness: awake and alert and oriented Pain management: pain level controlled Vital Signs Assessment: post-procedure vital signs reviewed and stable Respiratory status: spontaneous breathing and respiratory function stable Cardiovascular status: stable Postop Assessment: no signs of nausea or vomiting Anesthetic complications: no    Last Vitals:  Filed Vitals:   09/14/15 1231 09/14/15 1333  BP:  116/69  Pulse: 77 48  Temp:  36.6 C  Resp:  18    Last Pain:  Filed Vitals:   09/14/15 1334  PainSc: 0-No pain                 [name]

## 2015-09-14 NOTE — Progress Notes (Signed)
Voided 300 dark yellow urine in urinal, denied pain or discomfort.

## 2015-09-14 NOTE — Care Management Note (Signed)
Case Management Note  Patient Details  Name: Jeffery Goodwin MRN: [id number] Date of Birth: 02/08/1951  Subjective/Objective:                  Pt admitted from home s/p hip fracture and surgery. Pt lives with his wife and will return home at discharge. Pt has been independent with ADL's.  Action/Plan: Pt will need HH PT and rolling walker at discharge. Pt would like RW with [facility]. [facility] and will deliver [facility]W to pts room prior to discharge. [facility] prior to discharge.  Expected Discharge Date:                  Expected Discharge Plan:  Home w Home Health Services  In-House Referral:  Clinical Social Work  Discharge planning Services  CM Consult  Post Acute Care Choice:  Durable Medical Equipment, Home Health Choice offered to:  Patient  DME Arranged:  Dan HumphreysWalker DME Agency:  [facility]  [facility]  [facility]:     Status of Service:  In process, will continue to follow  Medicare Important Message Given:  Yes Date Medicare IM Given:    Medicare IM give by:    Date Additional Medicare IM Given:    Additional Medicare Important Message give by:     If discussed at Long Length of Stay Meetings, dates discussed:    Additional Comments:  Cheryl FlashBlackwell, Sherrod Toothman Crowder, RN 09/14/2015, 11:16 AM

## 2015-09-14 NOTE — Progress Notes (Signed)
Up to chair with PT, foley catheter removed per orders, tip intact, urinal given, wife at bedside.

## 2015-09-14 NOTE — Addendum Note (Signed)
Addendum  created 09/14/15 1341 by Earleen NewportAmy A Ravyn Nikkel, CRNA   Modules edited: Clinical Notes   Clinical Notes:  File: [id number]

## 2015-09-14 NOTE — Clinical Social Work Note (Signed)
Clinical Social Work Assessment  Patient Details  Name: Jeffery Goodwin MRN: [id number] Date of Birth: [birth year]  Date of referral:  09/14/15               Reason for consult:  Discharge Planning                Permission sought to share information with:    Permission granted to share information::     Name::        Agency::     Relationship::     Contact Information:     Housing/Transportation Living arrangements for the past 2 months:  Single Family Home Source of Information:  Patient, Spouse Patient Interpreter Needed:  None Criminal Activity/Legal Involvement Pertinent to Current Situation/Hospitalization:  No - Comment as needed Significant Relationships:  Spouse Lives with:  Spouse Do you feel safe going back to the place where you live?  Yes Need for family participation in patient care:  Yes (Comment)  Care giving concerns:  Pt admitted due to hip fracture.    Social Worker assessment / plan:  CSW met with pt and pt's wife at bedside. Pt alert and oriented and very pleasant. He slipped on ice and admitted due to fractured hip. Pt is post op day 1. He is sitting in chair and reports that he has already worked with therapy and was able to ambulate in hallway. Pt is very active at baseline and swims, bikes, and weight trains on a regular basis. Pt retired 2 years ago. He plans to return home with home health and indicates his wife will be available 24/7. Pt not interested in SNF at this time. CSW will sign off, but can be reconsulted if needed.    Employment status:  Retired Nurse, adult PT Recommendations:  Home with [place] / Referral to community resources:  Other (Comment Required) (CM for home health/equipment)  Patient/Family's Response to care:  Pt plans to return home with home health services.   Patient/Family's Understanding of and Emotional Response to Diagnosis, Current Treatment, and Prognosis:  Pt is very motivated to  get back to baseline.   Emotional Assessment Appearance:  Appears stated age Attitude/Demeanor/Rapport:  Other (Very pleasant) Affect (typically observed):  Appropriate, Pleasant Orientation:  Oriented to Self, Oriented to Place, Oriented to  Time, Oriented to Situation Alcohol / Substance use:  Not Applicable Psych involvement (Current and /or in the community):  No (Comment)  Discharge Needs  Concerns to be addressed:  Discharge Planning Concerns Readmission within the last 30 days:  No Current discharge risk:  Physical Impairment Barriers to Discharge:  No Barriers Identified   Jeffery Goodwin, [place] 09/14/2015, 11:34 AM [phone]

## 2015-09-14 NOTE — Progress Notes (Signed)
Physical Therapy Treatment Patient Details Name: Jeffery Goodwin MRN: [id number] DOB: 11/04/1950 Today's Date: 09/14/2015    History of Present Illness 64yo white male who sustained a fall onto icy driveway after exiting vehicle and sustained a left hip fx. Pt presenting on POD1 p L hip OIRF. Pt previously very health and active 30 years of running, cycling, swimming etc. 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     PT Comments    Pt received up in chair, still comfortable and feeling well. HR remains in brady, but pt denies dizziness, lightheadedness, loss of vision with positional changes: will continue to monitor. Has seen ortho since last visit, on new medication now, with improved pain management. Pt is agreeable to attempting AMB first. Gait is improving in distance and quality, but remains very slow. Transfers performed safely without need for additional cueing. Pt demonstrates excellent learning with HEP education and is agreeable to attempt 2x today indep with self assist as needed. Will attempt stairs training in morning tomorrow pending pt disposition. Pt making excellent progress, but still limited by pain. Pt encouraged to return to chair for dinner ad lib with supervision from nursing.    Follow Up Recommendations  Home health PT     Equipment Recommendations  Rolling walker with 5" wheels (has received already. )    Recommendations for Other Services       Precautions / Restrictions Precautions Precautions: Fall Restrictions Weight Bearing Restrictions: Yes LLE Weight Bearing: Weight bearing as tolerated    Mobility  Bed Mobility Overal bed mobility: Modified Independent                Transfers Overall transfer level: Modified independent Equipment used: Rolling walker (2 wheeled) Transfers: Sit to/from Stand Sit to Stand: Modified independent (Device/Increase time)         General transfer comment: Safe practices  demonstrates s cuing.   Ambulation/Gait Ambulation/Gait assistance: Supervision Ambulation Distance (Feet): 100 Feet Assistive device: Rolling walker (2 wheeled)   Gait velocity: 0.3445m/s   General Gait Details: Verbal cues for equal step length and L knee flexion during swing phase; improving mechanics, but remains slow, 3-point gait.    Stairs            Wheelchair Mobility    Modified Rankin (Stroke Patients Only)       Balance Overall balance assessment: Modified Independent                                  Cognition Arousal/Alertness: Awake/alert Behavior During Therapy: WFL for tasks assessed/performed Overall Cognitive Status: Within Functional Limits for tasks assessed                      Exercises Total Joint Exercises Bridges: AROM;10 reps;Both;Supine Other Exercises Other Exercises: Reviewed HEP in full with handout, and performed 3-5 sets of each teaching self assist to perform indep. Pt agreeable to perform 2 more times today in full: AP, SAQ, SLR, Abduction, Heel slides, bridging, and pillow squeeze.     General Comments        Pertinent Vitals/Pain Pain Assessment: 0-10 Pain Score: 4  (increases to 6 with AMB ) Pain Location: L hip Pain Descriptors / Indicators: Aching Pain Intervention(s): Limited activity within patient's tolerance;Monitored during session;Premedicated before session;Repositioned    Home Living  Prior Function            PT Goals (current goals can now be found in the care plan section) Acute Rehab PT Goals Patient Stated Goal: Return to regular swimming and cycling workouts.  PT Goal Formulation: With patient Time For Goal Achievement: 09/28/15 Potential to Achieve Goals: Poor Progress towards PT goals: Progressing toward goals    Frequency  BID    PT Plan Current plan remains appropriate    Co-evaluation             End of Session Equipment Utilized  During Treatment: Gait belt Activity Tolerance: Patient tolerated treatment well;Patient limited by pain Patient left: with call bell/phone within reach;in bed;with bed alarm set     Time: 1610-9604 PT Time Calculation (min) (ACUTE ONLY): 33 min  Charges:  $Gait Training: 8-22 mins $Therapeutic Exercise: 8-22 mins $Therapeutic Activity: 8-22 mins                    G Codes:      Ketura Sirek C 2015-09-22, 4:37 PM  4:40 PM  Rosamaria Lints, PT, DPT [place] License # [id number]

## 2015-09-14 NOTE — Progress Notes (Signed)
Subjective:  he is doing well postop. He has no complaints.  Objective: Vital signs in last 24 hours: Temp:  [97.4 F (36.3 C)-99 F (37.2 C)] 98.8 F (37.1 C) (01/11 0639) Pulse Rate:  [48-59] 50 (01/11 0639) Resp:  [11-33] 20 (01/11 0639) BP: (109-136)/(62-78) 116/71 mmHg (01/11 0639) SpO2:  [93 %-100 %] 99 % (01/11 0639) Weight change:  Last BM Date: 09/12/15  Intake/Output from previous day: 01/10 0701 - 01/11 0700 In: 6530 [P.O.:1440; I.V.:1915] Out: 625 [Urine:550; Blood:75]  PHYSICAL EXAM General appearance: alert, cooperative and no distress Resp: clear to auscultation bilaterally Cardio: regular rate and rhythm, S1, S2 normal, no murmur, click, rub or gallop GI: soft, non-tender; bowel sounds normal; no masses,  no organomegaly Extremities: No edema  Lab Results:  Results for orders placed or performed during the hospital encounter of 09/12/15 (from the past 48 hour(s))  CBC with Differential     Status: None   Collection Time: 09/12/15  3:10 PM  Result Value Ref Range   WBC 6.2 4.0 - 10.5 K/uL   RBC 4.56 4.22 - 5.81 MIL/uL   Hemoglobin 14.2 13.0 - 17.0 g/dL   HCT 42.2 39.0 - 52.0 %   MCV 92.5 78.0 - 100.0 fL   MCH 31.1 26.0 - 34.0 pg   MCHC 33.6 30.0 - 36.0 g/dL   RDW 12.2 11.5 - 15.5 %   Platelets 180 150 - 400 K/uL   Neutrophils Relative % 54 %   Neutro Abs 3.4 1.7 - 7.7 K/uL   Lymphocytes Relative 30 %   Lymphs Abs 1.9 0.7 - 4.0 K/uL   Monocytes Relative 9 %   Monocytes Absolute 0.6 0.1 - 1.0 K/uL   Eosinophils Relative 6 %   Eosinophils Absolute 0.4 0.0 - 0.7 K/uL   Basophils Relative 1 %   Basophils Absolute 0.0 0.0 - 0.1 K/uL  Basic metabolic panel     Status: Abnormal   Collection Time: 09/12/15  3:10 PM  Result Value Ref Range   Sodium 139 135 - 145 mmol/L   Potassium 4.4 3.5 - 5.1 mmol/L   Chloride 106 101 - 111 mmol/L   CO2 26 22 - 32 mmol/L   Glucose, Bld 104 (H) 65 - 99 mg/dL   BUN 26 (H) 6 - 20 mg/dL   Creatinine, Ser 1.08 0.61 -  1.24 mg/dL   Calcium 9.2 8.9 - 10.3 mg/dL   GFR calc non Af Amer >60 >60 mL/min   GFR calc Af Amer >60 >60 mL/min    Comment: (NOTE) The eGFR has been calculated using the CKD EPI equation. This calculation has not been validated in all clinical situations. eGFR's persistently <60 mL/min signify possible Chronic Kidney Disease.    Anion gap 7 5 - 15  Urinalysis, Routine w reflex microscopic (not at [facility])     Status: Abnormal   Collection Time: 09/12/15  6:09 PM  Result Value Ref Range   Color, Urine YELLOW YELLOW   APPearance CLEAR CLEAR   Specific Gravity, Urine 1.025 1.005 - 1.030   pH 5.5 5.0 - 8.0   Glucose, UA NEGATIVE NEGATIVE mg/dL   Hgb urine dipstick NEGATIVE NEGATIVE   Bilirubin Urine NEGATIVE NEGATIVE   Ketones, ur TRACE (A) NEGATIVE mg/dL   Protein, ur NEGATIVE NEGATIVE mg/dL   Nitrite NEGATIVE NEGATIVE   Leukocytes, UA NEGATIVE NEGATIVE    Comment: MICROSCOPIC NOT DONE ON URINES WITH NEGATIVE PROTEIN, BLOOD, LEUKOCYTES, NITRITE, OR GLUCOSE <1000 mg/dL.  Prepare RBC  Status: None   Collection Time: 09/12/15  8:00 PM  Result Value Ref Range   Order Confirmation ORDER PROCESSED BY BLOOD BANK   Type and screen [facility]     Status: None (Preliminary result)   Collection Time: 09/12/15  8:00 PM  Result Value Ref Range   ABO/RH(D) O POS    Antibody Screen NEG    Sample Expiration 09/15/2015    Unit Number [id number]    Blood Component Type RED CELLS,LR    Unit division 00    Status of Unit ALLOCATED    Transfusion Status OK TO TRANSFUSE    Crossmatch Result Compatible    Unit Number [id number]    Blood Component Type RED CELLS,LR    Unit division 00    Status of Unit ALLOCATED    Transfusion Status OK TO TRANSFUSE    Crossmatch Result Compatible   ABO/Rh     Status: None   Collection Time: 09/12/15  8:00 PM  Result Value Ref Range   ABO/RH(D) O POS   Surgical pcr screen     Status: Abnormal   Collection Time: 09/12/15  9:30 PM  Result  Value Ref Range   MRSA, PCR NEGATIVE NEGATIVE   Staphylococcus aureus POSITIVE (A) NEGATIVE    Comment:        The Xpert SA Assay (FDA approved for NASAL specimens in patients over 63 years of age), is one component of a comprehensive surveillance program.  Test performance has been validated by [facility] for patients greater than or equal to 14 year old. It is not intended to diagnose infection nor to guide or monitor treatment. RESULT CALLED TO, READ BACK BY AND VERIFIED WITH: WILSON A AT 0342 ON [id number] BY FORSYTH K   Protime-INR     Status: None   Collection Time: 09/13/15  6:09 AM  Result Value Ref Range   Prothrombin Time 13.7 11.6 - 15.2 seconds   INR 1.03 0.00 - 1.49  CBC     Status: Abnormal   Collection Time: 09/14/15  6:02 AM  Result Value Ref Range   WBC 6.2 4.0 - 10.5 K/uL   RBC 3.72 (L) 4.22 - 5.81 MIL/uL   Hemoglobin 11.7 (L) 13.0 - 17.0 g/dL   HCT 34.8 (L) 39.0 - 52.0 %   MCV 93.5 78.0 - 100.0 fL   MCH 31.5 26.0 - 34.0 pg   MCHC 33.6 30.0 - 36.0 g/dL   RDW 12.2 11.5 - 15.5 %   Platelets 135 (L) 150 - 400 K/uL  Basic metabolic panel     Status: Abnormal   Collection Time: 09/14/15  6:02 AM  Result Value Ref Range   Sodium 138 135 - 145 mmol/L   Potassium 4.1 3.5 - 5.1 mmol/L   Chloride 103 101 - 111 mmol/L   CO2 29 22 - 32 mmol/L   Glucose, Bld 103 (H) 65 - 99 mg/dL   BUN 13 6 - 20 mg/dL   Creatinine, Ser 0.94 0.61 - 1.24 mg/dL   Calcium 8.5 (L) 8.9 - 10.3 mg/dL   GFR calc non Af Amer >60 >60 mL/min   GFR calc Af Amer >60 >60 mL/min    Comment: (NOTE) The eGFR has been calculated using the CKD EPI equation. This calculation has not been validated in all clinical situations. eGFR's persistently <60 mL/min signify possible Chronic Kidney Disease.    Anion gap 6 5 - 15  Protime-INR     Status: None  Collection Time: 09/14/15  6:02 AM  Result Value Ref Range   Prothrombin Time 15.2 11.6 - 15.2 seconds   INR 1.18 0.00 - 1.49    ABGS No  results for input(s): PHART, PO2ART, TCO2, HCO3 in the last 72 hours.  Invalid input(s): PCO2 CULTURES Recent Results (from the past 240 hour(s))  Surgical pcr screen     Status: Abnormal   Collection Time: 09/12/15  9:30 PM  Result Value Ref Range Status   MRSA, PCR NEGATIVE NEGATIVE Final   Staphylococcus aureus POSITIVE (A) NEGATIVE Final    Comment:        The Xpert SA Assay (FDA approved for NASAL specimens in patients over 77 years of age), is one component of a comprehensive surveillance program.  Test performance has been validated by [facility] for patients greater than or equal to 23 year old. It is not intended to diagnose infection nor to guide or monitor treatment. RESULT CALLED TO, READ BACK BY AND VERIFIED WITH: WILSON A AT 0342 ON [id number] BY FORSYTH K    Studies/Results: Dg Chest 1 View  09/12/2015  CLINICAL DATA:  Pt fell on the ice today EXAM: CHEST 1 VIEW COMPARISON:  None. FINDINGS: The heart size and mediastinal contours are within normal limits. Both lungs are clear. The visualized skeletal structures are unremarkable. IMPRESSION: No active disease. Electronically Signed   By: Rolm Baptise M.D.   On: 09/12/2015 16:18   Dg Hip Operative Unilat With Pelvis Left  09/13/2015  CLINICAL DATA:  ORIF of left hip intertrochanteric fracture. EXAM: OPERATIVE LEFT HIP (WITH PELVIS IF PERFORMED) 2 VIEWS TECHNIQUE: Fluoroscopic spot image(s) were submitted for interpretation post-operatively. COMPARISON:  09/12/2015 FINDINGS: Intraoperative images demonstrate intramedullary nail placement extending to roughly the mid diaphysis and placement of a lag screw across the femoral neck. Alignment appears anatomic. IMPRESSION: Anatomic alignment after ORIF of the intratrochanteric left hip fracture. Electronically Signed   By: Aletta Edouard M.D.   On: 09/13/2015 15:05   Dg Hip Unilat With Pelvis Min 4 Views Left  09/12/2015  CLINICAL DATA:  [place] on ice today EXAM: DG HIP (WITH OR  WITHOUT PELVIS) 4+V LEFT COMPARISON:  None. FINDINGS: Three views of the left hip submitted. There is displaced intertrochanteric fracture of proximal left femur. IMPRESSION: Displaced intertrochanteric fracture of proximal left femur. Electronically Signed   By: Lahoma Crocker M.D.   On: 09/12/2015 16:18    Medications:  Prior to Admission:  Prescriptions prior to admission  Medication Sig Dispense Refill Last Dose  . aspirin 81 MG chewable tablet Chew 81 mg by mouth daily.    09/12/2015 at Unknown time  . CINNAMON PO Take 1 each by mouth daily. Take 1 spoonful with yogurt daily   09/12/2015 at Unknown time  . COCONUT OIL PO Take 1 each by mouth daily. Take 1 spoonful by mouth daily   09/12/2015 at Unknown time  . Cyanocobalamin (B-12) 3000 MCG CAPS Take 1 capsule by mouth daily.   09/12/2015 at Unknown time  . Flaxseed, Linseed, (FLAX SEEDS PO) Take 1 each by mouth daily. Take 1 spoonful with yogurt daily   09/12/2015 at Unknown time  . KRILL OIL PO Take 750 mg by mouth daily.   09/12/2015 at Unknown time  . Misc Natural Products (GLUCOS-CHONDROIT-MSM COMPLEX PO) Take 3 tablets by mouth every morning.    09/12/2015 at Unknown time  . Omega-3 Fatty Acids (FISH OIL) 1200 MG CAPS Take 3,600 mg by mouth daily.  09/12/2015 at Unknown time  . pantoprazole (PROTONIX) 40 MG tablet Take 40 mg by mouth daily.   09/12/2015 at Unknown time  . propranolol (INDERAL) 10 MG tablet Take 5-10 mg by mouth daily.    09/12/2015 at 1300  . Saw Palmetto 450 MG CAPS Take 450 mg by mouth daily.   09/12/2015 at Unknown time  . Tetrahydrozoline HCl (VISINE OP) Place 1 drop into both eyes 2 (two) times daily.   09/12/2015 at Unknown time  . vitamin C (ASCORBIC ACID) 500 MG tablet Take 500 mg by mouth daily.   09/12/2015 at Unknown time   Scheduled: . ketorolac  30 mg Intravenous 4 times per day  . mupirocin ointment  1 application Nasal BID  . naphazoline-glycerin  1 drop Both Eyes BID  . oxyCODONE-acetaminophen  1 tablet Oral Q4H  .  pantoprazole  40 mg Oral Daily  . propranolol  5 mg Oral BID  . vitamin C  500 mg Oral Daily  . warfarin  2.5 mg Oral q1800  . Warfarin - Physician Dosing Inpatient   Does not apply q1800   Continuous: . sodium chloride 75 mL/hr at 09/14/15 0606   QJS:[id number] **OR** acetaminophen, menthol-cetylpyridinium **OR** phenol, metoCLOPramide **OR** metoCLOPramide (REGLAN) injection, morphine injection, ondansetron **OR** ondansetron (ZOFRAN) IV, oxyCODONE-acetaminophen **AND** [DISCONTINUED] oxyCODONE, polyethylene glycol  Assesment:he is doing well post op. Active Problems:   Coronary atherosclerosis of native coronary artery   Hip fracture (HCC)   Intertrochanteric fracture of left hip (HCC)   Intertrochanteric fracture of left femur (HCC)    Plan:no change. Start PT    LOS: 2 days   Meera Vasco L 09/14/2015, 9:48 AM

## 2015-09-15 LAB — BASIC METABOLIC PANEL
Anion gap: 6 (ref 5–15)
BUN: 20 mg/dL (ref 6–20)
CO2: 27 mmol/L (ref 22–32)
Calcium: 8.4 mg/dL — ABNORMAL LOW (ref 8.9–10.3)
Chloride: 105 mmol/L (ref 101–111)
Creatinine, Ser: 1.11 mg/dL (ref 0.61–1.24)
GFR calc Af Amer: >60 mL/min (ref >60)
GFR calc non Af Amer: >60 mL/min (ref >60)
Glucose, Bld: 103 mg/dL — ABNORMAL HIGH (ref 65–99)
Potassium: 4 mmol/L (ref 3.5–5.1)
Sodium: 138 mmol/L (ref 135–145)

## 2015-09-15 LAB — CBC
HCT: 33.6 % — ABNORMAL LOW (ref 39.0–52.0)
Hemoglobin: 11.2 g/dL — ABNORMAL LOW (ref 13.0–17.0)
MCH: 30.7 pg (ref 26.0–34.0)
MCHC: 33.3 g/dL (ref 30.0–36.0)
MCV: 92.1 fL (ref 78.0–100.0)
Platelets: 139 10*3/uL — ABNORMAL LOW (ref 150–400)
RBC: 3.65 MIL/uL — ABNORMAL LOW (ref 4.22–5.81)
RDW: 12 % (ref 11.5–15.5)
WBC: 6.8 10*3/uL (ref 4.0–10.5)

## 2015-09-15 LAB — PROTIME-INR
INR: 1.14 (ref 0.00–1.49)
Prothrombin Time: 14.8 seconds (ref 11.6–15.2)

## 2015-09-15 MED ORDER — MAGNESIUM CITRATE PO SOLN
1.0000 | Freq: Every day | ORAL | Status: AC
  Administered 2015-09-15: 1 via ORAL
  Filled 2015-09-15: qty 296

## 2015-09-15 MED ORDER — SODIUM CHLORIDE 0.9 % IV SOLN
INTRAVENOUS | Status: DC
  Administered 2015-09-15: 1 mL via INTRAVENOUS

## 2015-09-15 NOTE — Progress Notes (Signed)
Ambulating in hall with PT using walker.

## 2015-09-15 NOTE — Progress Notes (Signed)
Subjective: He says he feels okay. No complaints except some pain in his hip. He is doing exceptionally well with physical therapy.  Objective: Vital signs in last 24 hours: Temp:  [97.9 F (36.6 C)-98.9 F (37.2 C)] 98.1 F (36.7 C) (01/12 0649) Pulse Rate:  [44-77] 55 (01/12 0649) Resp:  [16-20] 16 (01/12 0649) BP: (105-124)/(53-69) 114/69 mmHg (01/12 0649) SpO2:  [94 %-98 %] 98 % (01/12 0649) Weight change:  Last BM Date: 09/12/15  Intake/Output from previous day: 01/11 0701 - 01/12 0700 In: 1720 [P.O.:720] Out: 1575 [Urine:1575]  PHYSICAL EXAM General appearance: alert, cooperative and no distress Resp: clear to auscultation bilaterally Cardio: regular rate and rhythm, S1, S2 normal, no murmur, click, rub or gallop GI: soft, non-tender; bowel sounds normal; no masses,  no organomegaly  Lab Results:  Results for orders placed or performed during the hospital encounter of 09/12/15 (from the past 48 hour(s))  CBC     Status: Abnormal   Collection Time: 09/14/15  6:02 AM  Result Value Ref Range   WBC 6.2 4.0 - 10.5 K/uL   RBC 3.72 (L) 4.22 - 5.81 MIL/uL   Hemoglobin 11.7 (L) 13.0 - 17.0 g/dL   HCT 34.8 (L) 39.0 - 52.0 %   MCV 93.5 78.0 - 100.0 fL   MCH 31.5 26.0 - 34.0 pg   MCHC 33.6 30.0 - 36.0 g/dL   RDW 12.2 11.5 - 15.5 %   Platelets 135 (L) 150 - 400 K/uL  Basic metabolic panel     Status: Abnormal   Collection Time: 09/14/15  6:02 AM  Result Value Ref Range   Sodium 138 135 - 145 mmol/L   Potassium 4.1 3.5 - 5.1 mmol/L   Chloride 103 101 - 111 mmol/L   CO2 29 22 - 32 mmol/L   Glucose, Bld 103 (H) 65 - 99 mg/dL   BUN 13 6 - 20 mg/dL   Creatinine, Ser 0.94 0.61 - 1.24 mg/dL   Calcium 8.5 (L) 8.9 - 10.3 mg/dL   GFR calc non Af Amer >60 >60 mL/min   GFR calc Af Amer >60 >60 mL/min    Comment: (NOTE) The eGFR has been calculated using the CKD EPI equation. This calculation has not been validated in all clinical situations. eGFR's persistently <60 mL/min  signify possible Chronic Kidney Disease.    Anion gap 6 5 - 15  Protime-INR     Status: None   Collection Time: 09/14/15  6:02 AM  Result Value Ref Range   Prothrombin Time 15.2 11.6 - 15.2 seconds   INR 1.18 0.00 - 1.49  CBC     Status: Abnormal   Collection Time: 09/15/15  6:20 AM  Result Value Ref Range   WBC 6.8 4.0 - 10.5 K/uL   RBC 3.65 (L) 4.22 - 5.81 MIL/uL   Hemoglobin 11.2 (L) 13.0 - 17.0 g/dL   HCT 33.6 (L) 39.0 - 52.0 %   MCV 92.1 78.0 - 100.0 fL   MCH 30.7 26.0 - 34.0 pg   MCHC 33.3 30.0 - 36.0 g/dL   RDW 12.0 11.5 - 15.5 %   Platelets 139 (L) 150 - 400 K/uL  Basic metabolic panel     Status: Abnormal   Collection Time: 09/15/15  6:20 AM  Result Value Ref Range   Sodium 138 135 - 145 mmol/L   Potassium 4.0 3.5 - 5.1 mmol/L   Chloride 105 101 - 111 mmol/L   CO2 27 22 - 32 mmol/L   Glucose,  Bld 103 (H) 65 - 99 mg/dL   BUN 20 6 - 20 mg/dL   Creatinine, Ser 1.11 0.61 - 1.24 mg/dL   Calcium 8.4 (L) 8.9 - 10.3 mg/dL   GFR calc non Af Amer >60 >60 mL/min   GFR calc Af Amer >60 >60 mL/min    Comment: (NOTE) The eGFR has been calculated using the CKD EPI equation. This calculation has not been validated in all clinical situations. eGFR's persistently <60 mL/min signify possible Chronic Kidney Disease.    Anion gap 6 5 - 15  Protime-INR     Status: None   Collection Time: 09/15/15  6:20 AM  Result Value Ref Range   Prothrombin Time 14.8 11.6 - 15.2 seconds   INR 1.14 0.00 - 1.49    ABGS No results for input(s): PHART, PO2ART, TCO2, HCO3 in the last 72 hours.  Invalid input(s): PCO2 CULTURES Recent Results (from the past 240 hour(s))  Surgical pcr screen     Status: Abnormal   Collection Time: 09/12/15  9:30 PM  Result Value Ref Range Status   MRSA, PCR NEGATIVE NEGATIVE Final   Staphylococcus aureus POSITIVE (A) NEGATIVE Final    Comment:        The Xpert SA Assay (FDA approved for NASAL specimens in patients over 77 years of age), is one component  of a comprehensive surveillance program.  Test performance has been validated by [facility] for patients greater than or equal to 32 year old. It is not intended to diagnose infection nor to guide or monitor treatment. RESULT CALLED TO, READ BACK BY AND VERIFIED WITH: WILSON A AT 1610 ON [id number] BY FORSYTH K    Studies/Results: Dg Hip Operative Unilat With Pelvis Left  09/13/2015  CLINICAL DATA:  ORIF of left hip intertrochanteric fracture. EXAM: OPERATIVE LEFT HIP (WITH PELVIS IF PERFORMED) 2 VIEWS TECHNIQUE: Fluoroscopic spot image(s) were submitted for interpretation post-operatively. COMPARISON:  09/12/2015 FINDINGS: Intraoperative images demonstrate intramedullary nail placement extending to roughly the mid diaphysis and placement of a lag screw across the femoral neck. Alignment appears anatomic. IMPRESSION: Anatomic alignment after ORIF of the intratrochanteric left hip fracture. Electronically Signed   By: Aletta Edouard M.D.   On: 09/13/2015 15:05    Medications: Reviewed  Assesment: He had a hip fracture. He had surgery and has done well with physical therapy. Per Dr. Aline Brochure he will be ready to go home when he can manage stairs. Active Problems:   Coronary atherosclerosis of native coronary artery   Hip fracture (HCC)   Intertrochanteric fracture of left hip (HCC)   Intertrochanteric fracture of left femur ([place])    Plan: Discharge home when he is able to manage stairs per Dr. Aline Brochure    LOS: 3 days   Lynlee Stratton L 09/15/2015, 8:49 AM

## 2015-09-15 NOTE — Progress Notes (Signed)
 Physical Therapy Treatment Patient Details Name: Jeffery Goodwin MRN: [id number] DOB: 04/11/1951 Today's Date: 09/15/2015    History of Present Illness 64yo white male who sustained a fall onto icy driveway after exiting vehicle and sustained a left hip fx. Pt presenting on POD1 p L hip OIRF. Pt previously very health and active 30 years of running, cycling, swimming etc. 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     PT Comments    Pt making excellent progress this session, tolerating all activity well, with mild facial pallor/diaphorsesis upon performing stairs training, which resolve after 3 minutes recovery. Pt Ambulating twice as far and twice as quickly since last session. Pt performed full HEP with modified independence 2x yesterday, and one time this morning prior to entry. Pt performed 4 steps twice resting between, with Min Guard from wife to stabilize RW and attest verbal cues for form. Pt remains most limited by pain and weakness in L hip with poor active mobility in stance, and continued very slow mobility, but pt is safe, stable, and confident. Will continue to progress activity and monitor progress.    Follow Up Recommendations  Home health PT     Equipment Recommendations  Rolling walker with 5" wheels;3in1 (PT)    Recommendations for Other Services       Precautions / Restrictions Precautions Precautions: Fall Restrictions Weight Bearing Restrictions: Yes LLE Weight Bearing: Weight bearing as tolerated    Mobility  Bed Mobility               General bed mobility comments: Received up in chair.   Transfers Overall transfer level: Modified independent Equipment used: Rolling walker (2 wheeled) Transfers: Sit to/from Stand Sit to Stand: Modified independent (Device/Increase time)         General transfer comment: Safe practices demonstrates s cuing, 6x including recliner and WC. Able to tolerate low surfaces.    Ambulation/Gait Ambulation/Gait assistance: Supervision Ambulation Distance (Feet): 200 Feet (Stairs between 2-17200ft bouts. ) Assistive device: Rolling walker (2 wheeled)   Gait velocity: 0.613m/s today; 0.6867m/s 1DA Gait velocity interpretation: <1.8 ft/sec, indicative of risk for recurrent falls General Gait Details: Appears safe. some help with IV pole mgmt.    Stairs Stairs: Yes Stairs assistance: Min guard Stair Management: No rails Number of Stairs: 8 General stair comments: Posterior ascent, anterior descent; 2x4 CGA, wife stabilizing walker; no cues needed for safe performance after education and demonstration.   Wheelchair Mobility    Modified Rankin (Stroke Patients Only)       Balance Overall balance assessment: Modified Independent                                  Cognition Arousal/Alertness: Awake/alert Behavior During Therapy: WFL for tasks assessed/performed Overall Cognitive Status: Within Functional Limits for tasks assessed                      Exercises Total Joint Exercises Ankle Circles/Pumps:  (All exercises performed ModI yesterday x2, and 1x this morning. )    General Comments        Pertinent Vitals/Pain Pain Assessment: 0-10 Pain Score: 8  Pain Location: L hip Pain Descriptors / Indicators: Aching Pain Intervention(s): Limited activity within patient's tolerance;Monitored during session;Premedicated before session;Repositioned    Home Living  Prior Function            PT Goals (current goals can now be found in the care plan section) Acute Rehab PT Goals Patient Stated Goal: Return to regular swimming and cycling workouts.  PT Goal Formulation: With patient Time For Goal Achievement: 09/28/15 Potential to Achieve Goals: Poor Progress towards PT goals: Progressing toward goals    Frequency  BID    PT Plan Current plan remains appropriate    Co-evaluation              End of Session Equipment Utilized During Treatment: Gait belt Activity Tolerance: Patient tolerated treatment well;Patient limited by pain Patient left: with call bell/phone within reach;in chair;with family/visitor present;with nursing/sitter in room     Time: 1029-1110 PT Time Calculation (min) (ACUTE ONLY): 41 min  Charges:  $Gait Training: 38-52 mins                    G Codes:      , C October 06, 2015, 11:23 AM 11:28 AM  Rosamaria Lints, PT, DPT [place] License # [id number]

## 2015-09-15 NOTE — Progress Notes (Signed)
BP 114/69 mmHg  Pulse 55  Temp(Src) 98.1 F (36.7 C) (Oral)  Resp 16  Ht 6\' 1"  (1.854 m)  Wt 215 lb 8 oz (97.75 kg)  BMI 28.44 kg/m2  SpO2 98%  POD # 1   S/p im nail left hip awaiting stairs then can go home   Dressing change wound clean   Calf supple [place] normal   Neuro vascular motor intact

## 2015-09-15 NOTE — Progress Notes (Signed)
Requested Miralax , given.  Will monitor for bowel movement.

## 2015-09-16 LAB — TYPE AND SCREEN
ABO/RH(D): O POS
Antibody Screen: NEGATIVE
Unit division: 0
Unit division: 0

## 2015-09-16 LAB — CBC
HCT: 32.8 % — ABNORMAL LOW (ref 39.0–52.0)
Hemoglobin: 11.1 g/dL — ABNORMAL LOW (ref 13.0–17.0)
MCH: 31.4 pg (ref 26.0–34.0)
MCHC: 33.8 g/dL (ref 30.0–36.0)
MCV: 92.9 fL (ref 78.0–100.0)
Platelets: 179 10*3/uL (ref 150–400)
RBC: 3.53 MIL/uL — ABNORMAL LOW (ref 4.22–5.81)
RDW: 12.3 % (ref 11.5–15.5)
WBC: 6.2 10*3/uL (ref 4.0–10.5)

## 2015-09-16 LAB — PROTIME-INR
INR: 1.15 (ref 0.00–1.49)
Prothrombin Time: 14.9 seconds (ref 11.6–15.2)

## 2015-09-16 MED ORDER — POLYETHYLENE GLYCOL 3350 17 G PO PACK
17.0000 g | PACK | Freq: Every day | ORAL | Status: DC | PRN
Start: 2015-09-16 — End: 2015-12-05

## 2015-09-16 MED ORDER — OXYCODONE-ACETAMINOPHEN 5-325 MG PO TABS: 1.0000 | ORAL_TABLET | ORAL | Status: DC | PRN

## 2015-09-16 MED ORDER — WARFARIN SODIUM 2.5 MG PO TABS: 2.5000 mg | ORAL_TABLET | Freq: Every day | ORAL | Status: DC

## 2015-09-16 NOTE — Progress Notes (Signed)
Discharged via wheelchair accompanied by staff for discharge home in care of family.  Stable at discharge. 

## 2015-09-16 NOTE — Care Management (Signed)
Order for home health not placed by attending prior to discharge. Contacted Dr Romeo AppleHarrison who was in OR who stated that it was OK to place order for Home Health PT .

## 2015-09-16 NOTE — Discharge Summary (Signed)
Physician Discharge Summary  Patient ID: Jeffery SquibbJoseph W Libbey MRN: [id number] DOB/AGE: 67/03/1951 67 y.o.  Admit date: 09/12/2015 Discharge date: 09/16/2015  Admission Diagnoses: Admission diagnosis was left intertrochanteric fracture of the hip  Discharge Diagnoses: Same Active Problems:   Coronary atherosclerosis of native coronary artery   Hip fracture [facility](HCC)   Intertrochanteric fracture of left hip (HCC)   Intertrochanteric fracture of left femur [facility](HCC)   Discharged Condition: good  Hospital Course: Patient was admitted on the ninth had surgery on the 10th had uncomplicated internal fixation left hip with a short gamma nail. He did very well. No complications. He did have trouble moving his bowels and was held one extra day.  He is ambulatory tolerated stair therapy  He will be placed on mini dose non-therapeutic Coumadin along with baby aspirin. He gets sick on the stomach with full strength aspirin which was our initial choice. This has been discussed with him including the risks and benefits and he agrees to the treatment  Consults: orthopedic surgery   Discharge Exam: Blood pressure 119/69, pulse 59, temperature 98.2 F (36.8 C), temperature source Oral, resp. rate 16, height 6\' 1"  (1.854 m), weight 215 lb 8 oz (97.75 kg), SpO2 98 %. His wound is clean dry and intact has mild leg swelling as expected calf is soft Homans sign is negative he is awake alert 3  Disposition: 01-Home or Self Care  Discharge Instructions    Diet - low sodium heart healthy    Complete by:  As directed      Discharge instructions    Complete by:  As directed   Activity should be as tolerated. Make sure you're pumping your ankles to help with DVT prevention which is blood clot prevention  Weightbearing as tolerated on her operative leg.     Driving Restrictions    Complete by:  As directed   Do not drive for the next 3 weeks     Increase activity slowly    Complete by:  As directed              Medication List    TAKE these medications        aspirin 81 MG chewable tablet  Chew 81 mg by mouth daily.     B-12 3000 MCG Caps  Take 1 capsule by mouth daily.     CINNAMON PO  Take 1 each by mouth daily. Take 1 spoonful with yogurt daily     COCONUT OIL PO  Take 1 each by mouth daily. Take 1 spoonful by mouth daily     Fish Oil 1200 MG Caps  Take 3,600 mg by mouth daily.     FLAX SEEDS PO  Take 1 each by mouth daily. Take 1 spoonful with yogurt daily     GLUCOS-CHONDROIT-MSM COMPLEX PO  Take 3 tablets by mouth every morning.     KRILL OIL PO  Take 750 mg by mouth daily.     oxyCODONE-acetaminophen 5-325 MG tablet  Commonly known as:  PERCOCET/ROXICET  Take 1 tablet by mouth every 4 (four) hours as needed for moderate pain.     pantoprazole 40 MG tablet  Commonly known as:  PROTONIX  Take 40 mg by mouth daily.     polyethylene glycol packet  Commonly known as:  MIRALAX / GLYCOLAX  Take 17 g by mouth daily as needed for mild constipation.     propranolol 10 MG tablet  Commonly known as:  INDERAL  Take 5-10 mg  by mouth daily.     Saw Palmetto 450 MG Caps  Take 450 mg by mouth daily.     VISINE OP  Place 1 drop into both eyes 2 (two) times daily.     vitamin C 500 MG tablet  Commonly known as:  ASCORBIC ACID  Take 500 mg by mouth daily.     warfarin 2.5 MG tablet  Commonly known as:  COUMADIN  Take 1 tablet (2.5 mg total) by mouth daily at 6 PM.           Follow-up Information    Follow up with [facility].   Contact information:   [address] [place] [address] [phone]       Follow up with Fuller Canada, MD On 09/27/2015.   Specialties:  Orthopedic Surgery, Radiology   Why:  For wound re-check, For suture removal   Contact information:   [address] [place] [address] [phone]       Signed: Fuller Canada 09/16/2015, 7:48 AM

## 2015-09-16 NOTE — Consult Note (Addendum)
Addendum to original consult  General appearnce is normal  Alert awake and oriented 3. Affect normal. Not ambulatory secondary to left hip fracture

## 2015-09-16 NOTE — Progress Notes (Signed)
Subjective: Patient is resting. He is complaining of constipation. Overall he has improved. He is working with physical therapy. He is planned for discharge today.  Objective: Vital signs in last 24 hours: Temp:  [98.2 F (36.8 C)-99.4 F (37.4 C)] 98.2 F (36.8 C) (01/13 0632) Pulse Rate:  [52-75] 59 (01/13 0632) Resp:  [16-18] 16 (01/13 0632) BP: (119-129)/(68-76) 119/69 mmHg (01/13 0632) SpO2:  [97 %-98 %] 98 % (01/13 0632) Weight change:  Last BM Date: 09/12/15  Intake/Output from previous day: 01/12 0701 - 01/13 0700 In: 960 [P.O.:960] Out: 350 [Urine:350]  PHYSICAL EXAM General appearance: alert and no distress Resp: clear to auscultation bilaterally Cardio: S1, S2 normal GI: soft, non-tender; bowel sounds normal; no masses,  no organomegaly Extremities: incision site on the left hip healing, limited motion of left hip  Lab Results:  Results for orders placed or performed during the hospital encounter of 09/12/15 (from the past 48 hour(s))  CBC     Status: Abnormal   Collection Time: 09/15/15  6:20 AM  Result Value Ref Range   WBC 6.8 4.0 - 10.5 K/uL   RBC 3.65 (L) 4.22 - 5.81 MIL/uL   Hemoglobin 11.2 (L) 13.0 - 17.0 g/dL   HCT 33.6 (L) 39.0 - 52.0 %   MCV 92.1 78.0 - 100.0 fL   MCH 30.7 26.0 - 34.0 pg   MCHC 33.3 30.0 - 36.0 g/dL   RDW 12.0 11.5 - 15.5 %   Platelets 139 (L) 150 - 400 K/uL  Basic metabolic panel     Status: Abnormal   Collection Time: 09/15/15  6:20 AM  Result Value Ref Range   Sodium 138 135 - 145 mmol/L   Potassium 4.0 3.5 - 5.1 mmol/L   Chloride 105 101 - 111 mmol/L   CO2 27 22 - 32 mmol/L   Glucose, Bld 103 (H) 65 - 99 mg/dL   BUN 20 6 - 20 mg/dL   Creatinine, Ser 1.11 0.61 - 1.24 mg/dL   Calcium 8.4 (L) 8.9 - 10.3 mg/dL   GFR calc non Af Amer >60 >60 mL/min   GFR calc Af Amer >60 >60 mL/min    Comment: (NOTE) The eGFR has been calculated using the CKD EPI equation. This calculation has not been validated in all clinical  situations. eGFR's persistently <60 mL/min signify possible Chronic Kidney Disease.    Anion gap 6 5 - 15  Protime-INR     Status: None   Collection Time: 09/15/15  6:20 AM  Result Value Ref Range   Prothrombin Time 14.8 11.6 - 15.2 seconds   INR 1.14 0.00 - 1.49  CBC     Status: Abnormal   Collection Time: 09/16/15  6:16 AM  Result Value Ref Range   WBC 6.2 4.0 - 10.5 K/uL   RBC 3.53 (L) 4.22 - 5.81 MIL/uL   Hemoglobin 11.1 (L) 13.0 - 17.0 g/dL   HCT 32.8 (L) 39.0 - 52.0 %   MCV 92.9 78.0 - 100.0 fL   MCH 31.4 26.0 - 34.0 pg   MCHC 33.8 30.0 - 36.0 g/dL   RDW 12.3 11.5 - 15.5 %   Platelets 179 150 - 400 K/uL  Protime-INR     Status: None   Collection Time: 09/16/15  6:16 AM  Result Value Ref Range   Prothrombin Time 14.9 11.6 - 15.2 seconds   INR 1.15 0.00 - 1.49    ABGS No results for input(s): PHART, PO2ART, TCO2, HCO3 in the last 72 hours.    Invalid input(s): PCO2 CULTURES Recent Results (from the past 240 hour(s))  Surgical pcr screen     Status: Abnormal   Collection Time: 09/12/15  9:30 PM  Result Value Ref Range Status   MRSA, PCR NEGATIVE NEGATIVE Final   Staphylococcus aureus POSITIVE (A) NEGATIVE Final    Comment:        The Xpert SA Assay (FDA approved for NASAL specimens in patients over 21 years of age), is one component of a comprehensive surveillance program.  Test performance has been validated by [facility] for patients greater than or equal to 1 year old. It is not intended to diagnose infection nor to guide or monitor treatment. RESULT CALLED TO, READ BACK BY AND VERIFIED WITH: WILSON A AT 0342 ON [id number] BY FORSYTH K    Studies/Results: No results found.  Medications: I have reviewed the patient's current medications.  Assesment:   Active Problems:   Coronary atherosclerosis of native coronary artery   Hip fracture (HCC)   Intertrochanteric fracture of left hip (HCC)   Intertrochanteric fracture of left femur  (HCC) constipation   Plan:  Medications reviewed Discharge home as planned continue regular medications    LOS: 4 days   FANTA,TESFAYE 09/16/2015, 8:39 AM   

## 2015-09-16 NOTE — Progress Notes (Signed)
Central telemetry notified of discharge, telemetry removed.  IV access removed.  Discharge instructions reviewed with patient and family, questions answered, understanding verbalized. 

## 2015-09-19 ENCOUNTER — Telehealth: Payer: Self-pay | Admitting: *Deleted

## 2015-09-19 NOTE — Telephone Encounter (Signed)
[facility], Dyanne IhaDebbie Dabbs, called requesting to see patient twice a week for the next two weeks  Also asking about coumadin being checked  Advised Per Dr Romeo AppleHarrison visits approved and coumadin does not need to be checked since he is not on therapeutic coumadin

## 2015-09-26 ENCOUNTER — Ambulatory Visit (INDEPENDENT_AMBULATORY_CARE_PROVIDER_SITE_OTHER): Payer: Self-pay | Admitting: Orthopedic Surgery

## 2015-09-26 VITALS — BP 122/72 | Ht 73.0 in | Wt 215.5 lb

## 2015-09-26 DIAGNOSIS — S72002D Fracture of unspecified part of neck of left femur, subsequent encounter for closed fracture with routine healing: Secondary | ICD-10-CM

## 2015-09-26 MED ORDER — OXYCODONE-ACETAMINOPHEN 5-325 MG PO TABS: 1.0000 | ORAL_TABLET | ORAL | Status: DC | PRN

## 2015-09-26 NOTE — Progress Notes (Signed)
Patient ID: Jeffery Goodwin, male   DOB: 06/20/1951, 65 y.o.   MRN: [id number]  Chief Complaint  Patient presents with  . Follow-up    post op 1 left hip, DOS 09/13/15    BP 122/72 mmHg  Ht  (1.854 m)  Wt 215 lb 8 oz (97.75 kg)  BMI 28.44 kg/m2  This is postoperative day 13 left gamma nail patient weightbearing as tolerated with a walker her incision looks clean dry and intact there is some ecchymosis on the posterior aspect of his buttock and thigh is on 2.5 mg at nontherapeutic Coumadin which we are stopping today he has no peripheral edema.  Stop warfarin    ASSESSMENT AND PLAN   Stop warfarin, continue aspirin, x-rays in 2 weeks [facility] lateral left hip short gamma nail  Continue Percocet 5 mg every 4 when necessary pain #84

## 2015-09-27 ENCOUNTER — Telehealth: Payer: Self-pay | Admitting: *Deleted

## 2015-09-27 NOTE — Telephone Encounter (Signed)
Steri strips until Sunday, no sit down baths or submerging in water yet. Okay to wash with soap and water after Sunday  Patient aware

## 2015-09-27 NOTE — Telephone Encounter (Signed)
Patient called stating Dr. Romeo Apple took his staples out yesterday from where he broke his hip, patient would like to know what he needs to do about the wound care. Please advise [phone]

## 2015-10-03 ENCOUNTER — Ambulatory Visit (HOSPITAL_COMMUNITY): Payer: Medicare HMO | Attending: Orthopedic Surgery | Admitting: Physical Therapy

## 2015-10-03 DIAGNOSIS — R29898 Other symptoms and signs involving the musculoskeletal system: Secondary | ICD-10-CM | POA: Diagnosis present

## 2015-10-03 DIAGNOSIS — R269 Unspecified abnormalities of gait and mobility: Secondary | ICD-10-CM | POA: Diagnosis present

## 2015-10-03 DIAGNOSIS — M25652 Stiffness of left hip, not elsewhere classified: Secondary | ICD-10-CM | POA: Diagnosis present

## 2015-10-03 DIAGNOSIS — R6889 Other general symptoms and signs: Secondary | ICD-10-CM | POA: Diagnosis present

## 2015-10-03 DIAGNOSIS — R2689 Other abnormalities of gait and mobility: Secondary | ICD-10-CM | POA: Diagnosis present

## 2015-10-03 NOTE — Therapy (Addendum)
[facility] [address] [place], [place], [address] Phone: [phone]   Fax:  [phone]  Physical Therapy Evaluation  Patient Details  Name: Jeffery Goodwin MRN: [id number] Date of Birth: 06/12/1951 Referring Provider: Fuller Canada   Encounter Date: 10/03/2015      PT End of Session - 10/03/15 1054    Visit Number 1   Number of Visits 6   Date for PT Re-Evaluation 11/02/15   Authorization - Visit Number 1   Authorization - Number of Visits 6   PT Start Time 0935   PT Stop Time 1015   PT Time Calculation (min) 40 min   Activity Tolerance Patient tolerated treatment well      Past Medical History  Diagnosis Date  . Tremor     Right hand  . Carpal tunnel syndrome   . Arthritis   . GERD (gastroesophageal reflux disease)   . Dry eyes   . Coronary atherosclerosis of native coronary artery     DES mid LCx/OM2 12/2013  . Hyperlipidemia   . Neck pain   . Nocturia   . History of shingles     Past Surgical History  Procedure Laterality Date  . Arthroscopic left knee surgery  2005/2013  . Left inguinal herniorrhaphy  2008  . Carpal tunnel repair Right 1998  . Left heart catheterization with coronary angiogram N/A 12/11/2013    Procedure: LEFT HEART CATHETERIZATION WITH CORONARY ANGIOGRAM;  Surgeon: [name], MD;  Location: [facility] CATH LAB;  Service: Cardiovascular;  Laterality: N/A;  . Colonoscopy    . Anterior cervical decomp/discectomy fusion N/A 12/20/2014    Procedure: ANTERIOR CERVICAL DECOMPRESSION/DISCECTOMY FUSION 3 LEVELS;  Surgeon: [name], MD;  Location: [facility] NEURO ORS;  Service: Neurosurgery;  Laterality: N/A;  ANTERIOR CERVICAL DECOMPRESSION/DISCECTOMY FUSION 3 LEVELS C3-4,4-5,5-6  . Intramedullary (im) nail intertrochanteric Left 09/13/2015    Procedure: INTRAMEDULLARY  NAIL INTERTROCHANTRIC;  Surgeon: [name], MD;  Location: [facility] ORS;  Service: Orthopedics;  Laterality: Left;    There were no vitals  filed for this visit.  Visit Diagnosis:  Abnormality of gait - Plan: PT plan of care cert/re-cert  Stiffness of hip joint, left - Plan: PT plan of care cert/re-cert  Poor balance - Plan: PT plan of care cert/re-cert  Left leg weakness - Plan: PT plan of care cert/re-cert  Decreased activity tolerance - Plan: PT plan of care cert/re-cert      Subjective Assessment - 10/03/15 0939    Subjective Jeffery Goodwin states that he slipped on ice and fx his hip. His surgical repair was  on 1 /05/2016 he was discharged from the hospital on 09/15/2015.  He was seen by home health and was discharged by 09/29/2015.     Pertinent History stent 12/2013;, cervical surgery 12/2014; 2 arthroscopic surgeries on pt Lt knee.     How long can you sit comfortably? 10 minutes.    How long can you stand comfortably? 30 minutes    How long can you walk comfortably? walking with a walker for 20 minutes.    Currently in Pain? No/denies  highest pain has been a 6/10.  Prior to meds after working out.             [facility] PT Assessment - 10/03/15 0946    Assessment   Medical Diagnosis Lt hip fx    Referring Provider Fuller Canada    Onset Date/Surgical Date 09/12/15   Hand Dominance Right  Next MD Visit 10/10/2015   Prior Therapy HH   Precautions   Precautions None   Restrictions   Weight Bearing Restrictions No   Balance Screen   Has the patient fallen in the past 6 months Yes   How many times? 1   Has the patient had a decrease in activity level because of a fear of falling?  Yes   Is the patient reluctant to leave their home because of a fear of falling?  No   Home Environment   Living Environment Private residence   Living Arrangements Spouse/significant other   Type of Home House   Home Access Stairs to enter   Entrance Stairs-Number of Steps 3   Prior Function   Level of Independence Independent   Vocation Retired   Leisure working out, biking , walking canoe    Cognition   Overall Cognitive  Status Within Functional Limits for tasks assessed   Observation/Other Assessments   Focus on Therapeutic Outcomes (FOTO)  44   Functional Tests   Functional tests Single leg stance;Sit to Stand   Single Leg Stance   Comments Rt 2 seconds; Lt 0    Sit to Stand   Comments Has to use UE support  19.35   ROM / Strength   AROM / PROM / Strength Strength   Strength   Strength Assessment Site Hip;Knee;Ankle   Right/Left Hip Right;Left   Left Hip Flexion 4/5   Left Hip Extension 4/5   Left Hip ABduction 4-/5   Right/Left Knee Right;Left   Right Knee Extension 5/5   Left Knee Flexion 4+/5   Left Knee Extension 3+/5   Right/Left Ankle Right;Left   Left Ankle Dorsiflexion 4/5                   [facility] Adult PT Treatment/Exercise - 10/03/15 0001    Exercises   Exercises Knee/Hip   Knee/Hip Exercises: Standing   Heel Raises Both;10 reps   SLS x3   Knee/Hip Exercises: Supine   Heel Slides Left;5 reps   Bridges 10 reps   Straight Leg Raises Left;5 reps   Other Supine Knee/Hip Exercises clam x 5    Other Supine Knee/Hip Exercises hip abduction x 5.                 PT Education - 10/03/15 1053    Education provided Yes   Education Details HEP   Person(s) Educated Patient   Methods Explanation          PT Short Term Goals - 10/03/15 1302    PT SHORT TERM GOAL #1   Title Pt to be independent in his home exercise program in order to obtain goals.    Time 1   Period Weeks   PT SHORT TERM GOAL #2   Title Pt to be able to ambulate inside with a cane to allow one hand free to carry items such as a glass of water.    Time 3   Period Weeks   PT SHORT TERM GOAL #3   Title Pt SLS to be 10 seconds on both legs to decrease risk of falls    Time 3   Period Weeks   PT SHORT TERM GOAL #4   Title Pt to be able to ambulate with least assistive device for 40 minutes at a time to be able to shop and for better health habits.    Time 3   Period Weeks  PT  Long Term Goals - 2015/10/04 1351    PT LONG TERM GOAL #1   Title Pt to be independent in advance HEP to achieve goals    Time 6   Period Weeks   PT LONG TERM GOAL #2   Title Pt to be ambulating without an assistive device to achieve prior level of function of walking on a daily basis for exercise.   Time 6   Period Weeks   PT LONG TERM GOAL #3   Title Pt strength to be improved one grade to allow pt to be able to go up and down a flight of steps in a reciprocal manner    Time 6   Period Weeks   PT LONG TERM GOAL #4   Title Pt pain to be no greater than a 2/10 90% of the day to allow pt to get back to previous hobbies including biking.     Time 6   Period Weeks   PT LONG TERM GOAL #5   Title PT to be able to single leg stance for 15 seconds to reduce risk of falling    Time 6   Period Weeks   Additional Long Term Goals   Additional Long Term Goals Yes   PT LONG TERM GOAL #6   Title Pt to be able to demonstrate improved power in order to be able to squat to the floor to pick an item up and return to standing postiion without difficulty.   Time 6   Period Weeks               Plan - 10-04-2015 1055    Clinical Impression Statement Pt is a very active 65 yo who slipped on the ice and fractured his LT hip.  He is currently walking with a walker and going up steps sideways holding onto a rail.  He has been referred to skilled PT to maximize her functional ability.  Due to high co-pays the pt requests to come one time a week only and recieve advanced exercises that he can work on at home on his own.  Examination demonstrates increased pain, abnormal gait, decreased strength, decreased balance, and decreased functional tolerance.  Mr. Frasier will benefit from skilled PT to address these issues and progress him to his prior functional level.    Pt will benefit from skilled therapeutic intervention in order to improve on the following deficits Abnormal gait;Decreased activity  tolerance;Decreased balance;Decreased endurance;Decreased mobility;Pain;Decreased strength   PT Frequency 1x / week   PT Duration 6 weeks   PT Treatment/Interventions Patient/family education;DME Instruction;Gait training;Stair training;Functional mobility training;Therapeutic activities;Therapeutic exercise;Balance training   PT Next Visit Plan begin gait training with SPC, begin forward and lateral step ups, marching with holds for stability, sitde stepping with t-band progress to lunges, steps and vector stances. Give pt HEP as new exercises are introduced.    PT Home Exercise Plan given           G-Codes - 2015-10-04 1355    Functional Assessment Tool Used foto   Functional Limitation Mobility: Walking and moving around   Mobility: Walking and Moving Around Current Status [phone]) At least 40 percent but less than 60 percent impaired, limited or restricted   Mobility: Walking and Moving Around Goal Status [phone]) At least 20 percent but less than 40 percent impaired, limited or restricted       Problem List Patient Active Problem List   Diagnosis Date Noted  . Intertrochanteric fracture of  left femur (HCC) 09/13/2015  . Intertrochanteric fracture of left hip (HCC)   . Hip fracture (HCC) 09/12/2015  . Spinal stenosis in cervical region 12/20/2014  . Cervical herniated disc 12/20/2014  . History of elevated lipids 05/06/2014  . Elevated blood pressure 12/17/2013  . Abnormal myocardial perfusion study 12/07/2013  . Coronary atherosclerosis of native coronary artery 11/23/2013  . TREMOR, ESSENTIAL, RIGHT HAND 06/02/2009   Virgina Organ, PT CLT [phone] 10/03/2015, 2:09 PM  [facility] [address] [place], [place], [address] Phone: [phone]   Fax:  [phone]  Name: Jeffery Goodwin MRN: [id number] Date of Birth: December 25, 1950

## 2015-10-03 NOTE — Patient Instructions (Signed)
Hip Abduction / Adduction: with Knee Flexion (Supine)    With right knee bent, gently lower knee to side and return. Repeat _10___ times per set. Do _1___ sets per session. Do _3___ sessions per day.  http://orth.exer.us/682   Copyright  VHI. All rights reserved.  Functional Quadriceps: Sit to Stand   Use both arms then decrease to one arm  Sit on edge of chair, feet flat on floor. Stand upright, extending knees fully. Repeat _5-10___ times per set. Do 1____ sets per session. Do _2___ sessions per day.  http://orth.exer.us/734   Copyright  VHI. All rights reserved.  Balance: Unilateral    Attempt to balance on left leg, eyes open. Hold 5-30____ seconds. Repeat _3___ times per set. Do __1__ sets per session. Do _2___ sessions per day. Perform exercise with eyes closed.  http://orth.exer.us/28   Copyright  VHI. All rights reserved.  Ba http://orth.exer.us/28   Copyright  VHI. All rights reserved.

## 2015-10-03 NOTE — Patient Instructions (Signed)
Hip Abduction / Adduction: with Knee Flexion (Supine)    With right knee bent, gently lower knee to side and return. Repeat _10___ times per set. Do _1___ sets per session. Do _3___ sessions per day.  http://orth.exer.us/682   Copyright  VHI. All rights reserved.  Functional Quadriceps: Sit to Stand   Use both arms then decrease to one arm  Sit on edge of chair, feet flat on floor. Stand upright, extending knees fully. Repeat _5-10___ times per set. Do 1____ sets per session. Do _2___ sessions per day.  http://orth.exer.us/734   Copyright  VHI. All rights reserved.  Balance: Unilateral    Attempt to balance on left leg, eyes open. Hold 5-30____ seconds. Repeat _3___ times per set. Do __1__ sets per session. Do _2___ sessions per day. Perform exercise with eyes closed.  http://orth.exer.us/28   Copyright  VHI. All rights reserved.  Ba http://orth.exer.us/28   Copyright  VHI. All rights reserved.   

## 2015-10-10 ENCOUNTER — Ambulatory Visit (INDEPENDENT_AMBULATORY_CARE_PROVIDER_SITE_OTHER): Payer: Medicare HMO

## 2015-10-10 ENCOUNTER — Ambulatory Visit (INDEPENDENT_AMBULATORY_CARE_PROVIDER_SITE_OTHER): Payer: Self-pay | Admitting: Orthopedic Surgery

## 2015-10-10 VITALS — BP 129/76 | Ht 73.0 in | Wt 215.5 lb

## 2015-10-10 DIAGNOSIS — S72002D Fracture of unspecified part of neck of left femur, subsequent encounter for closed fracture with routine healing: Secondary | ICD-10-CM | POA: Diagnosis not present

## 2015-10-10 NOTE — Progress Notes (Signed)
Patient ID: Jeffery Goodwin, male   DOB: 1951-07-06, 65 y.o.   MRN: [id number]  Chief Complaint  Patient presents with  . Follow-up    2 week follow up + xray Left hip fx, DOS 09/13/15    BP 129/76 mmHg  Ht  (1.854 m)  Wt 215 lb 8 oz (97.75 kg)  BMI 28.44 kg/m2  Hip flexion has equaled his opposite side can lift his leg very well is walking with a walker still a little afraid to go to a cane. I've encouraged to progress to a cane as today's x-rays show excellent bony consolidation occurring with good implant position and no complications    ASSESSMENT AND PLAN   Stable postop gamma nail left inotrope fracture  Okay to go to Jeffery SHAWGOe a bicycle go to a cane; follow-up in 8 weeks for x-ray left hip

## 2015-10-11 ENCOUNTER — Ambulatory Visit (HOSPITAL_COMMUNITY): Payer: Medicare HMO | Attending: Orthopedic Surgery

## 2015-10-11 DIAGNOSIS — R6889 Other general symptoms and signs: Secondary | ICD-10-CM | POA: Diagnosis not present

## 2015-10-11 DIAGNOSIS — R269 Unspecified abnormalities of gait and mobility: Secondary | ICD-10-CM | POA: Insufficient documentation

## 2015-10-11 DIAGNOSIS — M25652 Stiffness of left hip, not elsewhere classified: Secondary | ICD-10-CM | POA: Insufficient documentation

## 2015-10-11 DIAGNOSIS — R2689 Other abnormalities of gait and mobility: Secondary | ICD-10-CM | POA: Insufficient documentation

## 2015-10-11 DIAGNOSIS — R29898 Other symptoms and signs involving the musculoskeletal system: Secondary | ICD-10-CM | POA: Diagnosis not present

## 2015-10-11 NOTE — Therapy (Signed)
[facility] [address] [place], [place], [address] Phone: [phone]   Fax:  [phone]  Physical Therapy Treatment  Patient Details  Name: Jeffery Goodwin MRN: [id number] Date of Birth: 1951/02/17 Referring Provider: Fuller Canada   Encounter Date: 10/11/2015      PT End of Session - 10/11/15 1659    Visit Number 2   Number of Visits 6   Date for PT Re-Evaluation 11/02/15   Authorization - Visit Number 2   Authorization - Number of Visits 6   PT Start Time 1522   PT Stop Time 1615   PT Time Calculation (min) 53 min   Equipment Utilized During Treatment Gait belt   Activity Tolerance Patient tolerated treatment well;No increased pain   Behavior During Therapy [facility] for tasks assessed/performed      Past Medical History  Diagnosis Date  . Tremor     Right hand  . Carpal tunnel syndrome   . Arthritis   . GERD (gastroesophageal reflux disease)   . Dry eyes   . Coronary atherosclerosis of native coronary artery     DES mid LCx/OM2 12/2013  . Hyperlipidemia   . Neck pain   . Nocturia   . History of shingles     Past Surgical History  Procedure Laterality Date  . Arthroscopic left knee surgery  2005/2013  . Left inguinal herniorrhaphy  2008  . Carpal tunnel repair Right 1998  . Left heart catheterization with coronary angiogram N/A 12/11/2013    Procedure: LEFT HEART CATHETERIZATION WITH CORONARY ANGIOGRAM;  Surgeon: [name], MD;  Location: [facility] CATH LAB;  Service: Cardiovascular;  Laterality: N/A;  . Colonoscopy    . Anterior cervical decomp/discectomy fusion N/A 12/20/2014    Procedure: ANTERIOR CERVICAL DECOMPRESSION/DISCECTOMY FUSION 3 LEVELS;  Surgeon: [name], MD;  Location: [facility] NEURO ORS;  Service: Neurosurgery;  Laterality: N/A;  ANTERIOR CERVICAL DECOMPRESSION/DISCECTOMY FUSION 3 LEVELS C3-4,4-5,5-6  . Intramedullary (im) nail intertrochanteric Left 09/13/2015    Procedure: INTRAMEDULLARY  NAIL  INTERTROCHANTRIC;  Surgeon: [name], MD;  Location: [facility] ORS;  Service: Orthopedics;  Laterality: Left;    There were no vitals filed for this visit.  Visit Diagnosis:  Stiffness of hip joint, left  Poor balance  Left leg weakness  Decreased activity tolerance  Abnormality of gait      Subjective Assessment - 10/11/15 1527    Subjective Pt denied pain upon arrival, but noted that he continues to experience L lateral hip pain with long distance ambulation. L hip pain has ranged between a 0-6/10 on a VAS. He noted that he discontinued his pain meds due to their side effects and is able to manage his symptoms with rest and stretching. Pt noted good compliance with his current HEP with no issues reported.    Pertinent History stent 12/2013;, cervical surgery 12/2014; 2 arthroscopic surgeries on pt Lt knee.     How long can you sit comfortably? 15 minutes.    How long can you stand comfortably? 30 minutes for bathing    How long can you walk comfortably? walking with a walker for 20 minutes.    Patient Stated Goals Pt's goals include imrpoving his L hip strength, reducing pain levels, and being able to ambulate without an AD or pain.    Currently in Pain? No/denies   Pain Score 0-No pain   Pain Location Hip   Pain Orientation Left   Pain Descriptors / Indicators Aching  Pain Type Surgical pain   Pain Onset More than a month ago   Pain Frequency Intermittent   Aggravating Factors  Long distance ambulation   Pain Relieving Factors rest, change of position, and HEP ROM ther ex    Effect of Pain on Daily Activities Limiting pt's ability to ambulate long distances    Multiple Pain Sites No                         [facility] Adult PT Treatment/Exercise - 10/11/15 0001    Ambulation/Gait   Ambulation/Gait Yes, Gait training completed with focus on 2-point and 3-point step-through/step-to gait pattern with the use of a SPC placed in the R hand. Verbal and visual cues  required for proper sequence   Ambulation/Gait Assistance 5: Supervision   Ambulation Distance (Feet) 640 Feet   Assistive device Straight cane   Gait Pattern Decreased arm swing - right;Decreased arm swing - left;Decreased step length - right;Right foot flat   Ambulation Surface Level   Exercises   Exercises Knee/Hip   Knee/Hip Exercises: Standing   Heel Raises Both;15 reps   Step Down 1 set;15 reps;Left;Hand Hold: 2;Step Height: 4"   Functional Squat 2 sets;10 reps   Manual Therapy   Manual Therapy Soft tissue mobilization;Myofascial release   Soft tissue mobilization L hip scar mobs; L glut med/max in R S/L position with use of rolling stick   Myofascial Release  L glute med/max in R S/L position with use of palmar spreading technique                PT Education - 10/11/15 1658    Education provided Yes   Education Details Updated on current HEP, L LE elevation to assist with edema, self-scar massage technique, use of cryotherapy to manage pain, and proper use of SPC with ambulation    Person(s) Educated Patient   Methods Explanation;Demonstration;Verbal cues;Tactile cues   Comprehension Returned demonstration;Verbalized understanding          PT Short Term Goals - 10/03/15 1302    PT SHORT TERM GOAL #1   Title Pt to be independent in his home exercise program in order to obtain goals.    Time 1   Period Weeks   PT SHORT TERM GOAL #2   Title Pt to be able to ambulate inside with a cane to allow one hand free to carry items such as a glass of water.    Time 3   Period Weeks   PT SHORT TERM GOAL #3   Title Pt SLS to be 10 seconds on both legs to decrease risk of falls    Time 3   Period Weeks   PT SHORT TERM GOAL #4   Title Pt to be able to ambulate with least assistive device for 40 minutes at a time to be able to shop and for better health habits.    Time 3   Period Weeks           PT Long Term Goals - 10/03/15 1351    PT LONG TERM GOAL #1   Title Pt  to be independent in advance HEP to achieve goals    Time 6   Period Weeks   PT LONG TERM GOAL #2   Title Pt to be ambulating without an assistive device to achieve prior level of function   Time 6   Period Weeks   PT LONG TERM GOAL #3   Title  Pt strength to be improved one grade to allow pt to be able to go up and down a flight of steps in a reciprocal manner    Time 6   Period Weeks   PT LONG TERM GOAL #4   Title Pt pain to be no greater than a 2/10 90% of the day to allow pt to get back to previous hobbies.    Time 6   Period Weeks   PT LONG TERM GOAL #5   Title PT to be able to single leg stance for 15 seconds to reduce risk of falling    Time 6   Period Weeks   Additional Long Term Goals   Additional Long Term Goals Yes   PT LONG TERM GOAL #6   Title Pt to be able to demonstrate improved power in order to be able to squat to the floor to pick an item up and return to standing postiion without difficulty.   Time 6   Period Weeks               Plan - 10/11/15 1700    Clinical Impression Statement Initiated PT tx with gait training with the use of a SPC with focus on a 2-point, step- through gait pattern with the use of a SPC placed in the R hand. Pt initiated with a 3-point gait pattern and quickly transitioned to a 2-point gait pattern after ambulating ~30 ft on level terrain. Pt presented with decreased step length of the R LE with foot flat of the R foot assessed at initial contact. Verbal and visual cues provided for proper heel to toe gait pattern with further focus on a symmetrical step pattern. Pt demo improved gait pattern once instructions and cues provided. Decreased reciprocal arm swing assessed with cues required to relax B UE. Pt was steady while ambulating, but presented with slow cadence. Pt noted that he would be getting a SPC later today. Therapist instructed the pt to only ambulate short distance indoors on level terrain with the use of a SPC until further  notice. Pt verbalized full understanding. Further instructed the pt to use his FWW outdoors and indoors for long distances and to focus on proper heel to toe gait pattern. Progressed to functional strengthening ther ex including squats and forward step-ups on 4" step with B UE support. Pt required instructions on proper technique and knee alignment with good understanding demo. Slight difficulty reported and assessed with eccentric return with step-ups. Forward step-ups were added to the pt's current HEP with instructions to complete step-ups 1-2x/day x 10-15 reps, every day with B UE support. End PT tx with completion of scar mobs and STM/MFR to the L lateral hip in order to reduce tissue restrictions. Pt tolerated tx well with no adverse effects or exacerbation of pain reported. Pt would benefit from continued skilled PT to further progress towards more advanced functional strengthening ther ex.    Pt will benefit from skilled therapeutic intervention in order to improve on the following deficits Abnormal gait;Decreased activity tolerance;Decreased balance;Decreased endurance;Decreased mobility;Pain;Decreased strength;Decreased scar mobility   Rehab Potential Good   PT Frequency 1x / week   PT Duration 6 weeks   PT Treatment/Interventions Patient/family education;DME Instruction;Gait training;Stair training;Functional mobility training;Therapeutic activities;Therapeutic exercise;Balance training;Scar mobilization;Manual techniques   PT Next Visit Plan Continue with current POC and add lateral step ups and side stepping with t-band at next PT visit as tolerated. Update HEP handouts at next visit.    PT Home  Exercise Plan Reviewed initial HEP with addition of forward step-ups on 4" step. Handout was not provided this visit due to pt declined.    Consulted and Agree with Plan of Care Patient        Problem List Patient Active Problem List   Diagnosis Date Noted  . Intertrochanteric fracture of left  femur (HCC) 09/13/2015  . Intertrochanteric fracture of left hip (HCC)   . Hip fracture (HCC) 09/12/2015  . Spinal stenosis in cervical region 12/20/2014  . Cervical herniated disc 12/20/2014  . History of elevated lipids 05/06/2014  . Elevated blood pressure 12/17/2013  . Abnormal myocardial perfusion study 12/07/2013  . Coronary atherosclerosis of native coronary artery 11/23/2013  . TREMOR, ESSENTIAL, RIGHT HAND 06/02/2009    Bonnee Quin, PT, DPT   10/11/2015, 5:12 PM  [facility] [address] [place], [place], [address] Phone: [phone]   Fax:  [phone]  Name: Jeffery Goodwin MRN: [id number] Date of Birth: [birth year]

## 2015-10-18 ENCOUNTER — Ambulatory Visit (HOSPITAL_COMMUNITY): Payer: Medicare HMO | Admitting: Physical Therapy

## 2015-10-18 DIAGNOSIS — R29898 Other symptoms and signs involving the musculoskeletal system: Secondary | ICD-10-CM

## 2015-10-18 DIAGNOSIS — R269 Unspecified abnormalities of gait and mobility: Secondary | ICD-10-CM

## 2015-10-18 DIAGNOSIS — R6889 Other general symptoms and signs: Secondary | ICD-10-CM

## 2015-10-18 DIAGNOSIS — M25652 Stiffness of left hip, not elsewhere classified: Secondary | ICD-10-CM | POA: Diagnosis not present

## 2015-10-18 DIAGNOSIS — R2689 Other abnormalities of gait and mobility: Secondary | ICD-10-CM

## 2015-10-18 NOTE — Patient Instructions (Signed)
Functional Quadriceps: Chair Squat    Keeping feet flat on floor, shoulder width apart, squat as low as is comfortable. Use support as necessary. Repeat 10____ times per set. Do __1__ sets per session. Do _2___ sessions per day.  http://orth.exer.us/736   Copyright  VHI. All rights reserved.  Step-Down / Step-Up    Stand on stair step or __6__ inch stool. Slowly bend left leg, lowering other foot to floor. Return by straightening front leg. Repeat _10___ times per set. Do __1__ sets per session. Do __2__ sessions per day.  http://orth.exer.us/684   Copyright  VHI. All rights reserved.  Hip Hike    Stand on step, right leg off step, knee straight. Raise unsupported hip, keeping knee straight. Repeat ___15_ times per set. Do _1___ sets per session. Do ____ sessions per day. 2 http://orth.exer.us/692   Copyright  VHI. All rights reserved.  Hip Hike http://orth.exer.us/692   Copyright  VHI. All rights reserved.

## 2015-10-18 NOTE — Therapy (Addendum)
[facility] [address] [place], [place], [address] Phone: [phone]   Fax:  [phone]  Physical Therapy Treatment  Patient Details  Name: Jeffery Goodwin MRN: [id number] Date of Birth: 1951-01-28 Referring Provider: Arther Abbott   Encounter Date: 10/18/2015      PT End of Session - 10/18/15 1451    Visit Number 3   Number of Visits 6   Date for PT Re-Evaluation 11/02/15   Authorization - Visit Number 3   Authorization - Number of Visits 6   PT Start Time 1430   PT Stop Time 1513   PT Time Calculation (min) 43 min   Activity Tolerance Patient tolerated treatment well   Behavior During Therapy [facility] for tasks assessed/performed      Past Medical History  Diagnosis Date  . Tremor     Right hand  . Carpal tunnel syndrome   . Arthritis   . GERD (gastroesophageal reflux disease)   . Dry eyes   . Coronary atherosclerosis of native coronary artery     DES mid LCx/OM2 12/2013  . Hyperlipidemia   . Neck pain   . Nocturia   . History of shingles     Past Surgical History  Procedure Laterality Date  . Arthroscopic left knee surgery  2005/2013  . Left inguinal herniorrhaphy  2008  . Carpal tunnel repair Right 1998  . Left heart catheterization with coronary angiogram N/A 12/11/2013    Procedure: LEFT HEART CATHETERIZATION WITH CORONARY ANGIOGRAM;  Surgeon: Peter M Martinique, MD;  Location: [facility] CATH LAB;  Service: Cardiovascular;  Laterality: N/A;  . Colonoscopy    . Anterior cervical decomp/discectomy fusion N/A 12/20/2014    Procedure: ANTERIOR CERVICAL DECOMPRESSION/DISCECTOMY FUSION 3 LEVELS;  Surgeon: Earnie Larsson, MD;  Location: [facility] NEURO ORS;  Service: Neurosurgery;  Laterality: N/A;  ANTERIOR CERVICAL DECOMPRESSION/DISCECTOMY FUSION 3 LEVELS C3-4,4-5,5-6  . Intramedullary (im) nail intertrochanteric Left 09/13/2015    Procedure: INTRAMEDULLARY  NAIL INTERTROCHANTRIC;  Surgeon: Carole Civil, MD;  Location: [facility] ORS;  Service:  Orthopedics;  Laterality: Left;    There were no vitals filed for this visit.  Visit Diagnosis:  Stiffness of hip joint, left  Poor balance  Left leg weakness  Decreased activity tolerance  Abnormality of gait      Subjective Assessment - 10/18/15 1429    Subjective Pt states that he is back swimming which has helped his knee pain.  Pt comes into the department walking with his cane.     Currently in Pain? Yes   Pain Score 4    Pain Location Hip   Pain Orientation Left   Pain Descriptors / Indicators Aching   Pain Type Surgical pain   Pain Onset More than a month ago   Pain Frequency Intermittent   Aggravating Factors  weight bearing    Pain Relieving Factors resting                          [facility] Adult PT Treatment/Exercise - 10/18/15 0001    Exercises   Exercises Knee/Hip   Knee/Hip Exercises: Standing   Heel Raises Both;15 reps   Lateral Step Up Left;15 reps;Hand Hold: 1;Step Height: 6"   Forward Step Up Left;10 reps;Hand Hold: 1;Step Height: 6"   Functional Squat 15 reps   SLS x3 both    Other Standing Knee Exercises side step with blue t-band(red) x 2 RT    Other Standing Knee Exercises  tandem stance   Knee/Hip Exercises: Seated   Sit to Sand 10 reps     rocker board for 2 minutes both right/left and anterior/posterior            PT Education - 10/18/15 1447    Education provided Yes   Education Details for new exercises also raised height of cane    Person(s) Educated Patient   Methods Explanation;Verbal cues;Demonstration;Handout   Comprehension Verbalized understanding;Returned demonstration          PT Short Term Goals - 10/03/15 1302    PT SHORT TERM GOAL #1   Title Pt to be independent in his home exercise program in order to obtain goals.    Time 1   Period Weeks   PT SHORT TERM GOAL #2   Title Pt to be able to ambulate inside with a cane to allow one hand free to carry items such as a glass of water.    Time 3    Period Weeks   PT SHORT TERM GOAL #3   Title Pt SLS to be 10 seconds on both legs to decrease risk of falls    Time 3   Period Weeks   PT SHORT TERM GOAL #4   Title Pt to be able to ambulate with least assistive device for 40 minutes at a time to be able to shop and for better health habits.    Time 3   Period Weeks           PT Long Term Goals - 10/03/15 1351    PT LONG TERM GOAL #1   Title Pt to be independent in advance HEP to achieve goals    Time 6   Period Weeks   PT LONG TERM GOAL #2   Title Pt to be ambulating without an assistive device to achieve prior level of function   Time 6   Period Weeks   PT LONG TERM GOAL #3   Title Pt strength to be improved one grade to allow pt to be able to go up and down a flight of steps in a reciprocal manner    Time 6   Period Weeks   PT LONG TERM GOAL #4   Title Pt pain to be no greater than a 2/10 90% of the day to allow pt to get back to previous hobbies.    Time 6   Period Weeks   PT LONG TERM GOAL #5   Title PT to be able to single leg stance for 15 seconds to reduce risk of falling    Time 6   Period Weeks   Additional Long Term Goals   Additional Long Term Goals Yes   PT LONG TERM GOAL #6   Title Pt to be able to demonstrate improved power in order to be able to squat to the floor to pick an item up and return to standing postiion without difficulty.   Time 6   Period Weeks               Plan - 10/18/15 1451    Clinical Impression Statement Pt cane adjusted for pt height.  Added strengthening and balance exercises with update of HEP.  Pt main deficit apears to be balance.  All balance activiites were completed with therapist facilitation for safety.  Pt continues to improve with function.   Pt will benefit from skilled therapeutic intervention in order to improve on the following deficits Abnormal gait;Decreased activity tolerance;Decreased balance;Decreased endurance;Decreased  mobility;Pain;Decreased  strength;Decreased scar mobility   Rehab Potential Good   PT Treatment/Interventions Patient/family education;DME Instruction;Gait training;Stair training;Functional mobility training;Therapeutic activities;Therapeutic exercise;Balance training;Scar mobilization;Manual techniques   PT Next Visit Plan continue to progress with stair climbing next visit.         Problem List Patient Active Problem List   Diagnosis Date Noted  . Intertrochanteric fracture of left femur ([place]) 09/13/2015  . Intertrochanteric fracture of left hip ([place])   . Hip fracture ([place]) 09/12/2015  . Spinal stenosis in cervical region 12/20/2014  . Cervical herniated disc 12/20/2014  . History of elevated lipids 05/06/2014  . Elevated blood pressure 12/17/2013  . Abnormal myocardial perfusion study 12/07/2013  . Coronary atherosclerosis of native coronary artery 11/23/2013  . TREMOR, ESSENTIAL, RIGHT HAND 06/02/2009    Rayetta Humphrey, PT CLT [phone] 10/18/2015, 3:14 PM  [place] [address] [place], [place], [address] Phone: [phone]   Fax:  [phone]  Name: Jeffery Goodwin MRN: [id number] Date of Birth: September 06, 1950    PHYSICAL THERAPY DISCHARGE SUMMARY  Visits from Start of Care: 3 PT request discharge from therapy. He is back swimming and feels he is close To prior function.  Current functional level related to goals / functional outcomes: Met    Remaining deficits: none   Education / Equipment: HEP  Plan: Patient agrees to discharge.  Patient goals were partially met. Patient is being discharged due to being pleased with the current functional level.  ?????       Rayetta Humphrey, [place] CLT [phone]

## 2015-10-19 ENCOUNTER — Telehealth: Payer: Self-pay | Admitting: *Deleted

## 2015-10-19 NOTE — Telephone Encounter (Signed)
Patient called stating he would like to have a bone density test done before coming back to see Dr. Romeo Apple. Please advise [phone], per patient it is okay to leave a detailed message on voicemail.

## 2015-10-19 NOTE — Telephone Encounter (Signed)
Routing to Dr Harrison for approval 

## 2015-10-20 ENCOUNTER — Telehealth (HOSPITAL_COMMUNITY): Payer: Self-pay | Admitting: Physical Therapy

## 2015-10-20 NOTE — Telephone Encounter (Signed)
Patient aware.

## 2015-10-20 NOTE — Telephone Encounter (Signed)
[facility] and request to be D/c from PT. He said Thank You to our staff for all their good work. NF

## 2015-10-20 NOTE — Telephone Encounter (Signed)
It will be hard to do because of positioning so it will be better to wait until he is about 12 weeks post op

## 2015-10-24 ENCOUNTER — Encounter (HOSPITAL_COMMUNITY): Payer: Medicare HMO | Admitting: Physical Therapy

## 2015-10-31 ENCOUNTER — Encounter (HOSPITAL_COMMUNITY): Payer: Medicare HMO

## 2015-11-07 ENCOUNTER — Encounter (HOSPITAL_COMMUNITY): Payer: Medicare HMO | Admitting: Physical Therapy

## 2015-11-17 ENCOUNTER — Other Ambulatory Visit: Payer: Self-pay | Admitting: Orthopedic Surgery

## 2015-11-17 DIAGNOSIS — S72002D Fracture of unspecified part of neck of left femur, subsequent encounter for closed fracture with routine healing: Secondary | ICD-10-CM

## 2015-12-05 ENCOUNTER — Ambulatory Visit (INDEPENDENT_AMBULATORY_CARE_PROVIDER_SITE_OTHER): Payer: Medicare HMO | Admitting: Cardiology

## 2015-12-05 ENCOUNTER — Encounter: Payer: Self-pay | Admitting: Cardiology

## 2015-12-05 VITALS — BP 138/80 | HR 52 | Ht 73.0 in | Wt 210.0 lb

## 2015-12-05 DIAGNOSIS — E785 Hyperlipidemia, unspecified: Secondary | ICD-10-CM | POA: Diagnosis not present

## 2015-12-05 DIAGNOSIS — I2510 Atherosclerotic heart disease of native coronary artery without angina pectoris: Secondary | ICD-10-CM

## 2015-12-05 NOTE — Progress Notes (Signed)
Cardiology Office Note  Date: 12/05/2015   ID: Jeffery Goodwin, DOB 02/28/1951, MRN [id number]  PCP: Jeffery MaudlinHAWKINS,EDWARD L, MD  Primary Cardiologist: Jeffery DellSamuel McDowell, MD   Chief Complaint  Patient presents with  . Coronary Artery Disease    History of Present Illness: Jeffery Goodwin is a 65 y.o. male last seen in October 2016. I reviewed his chart, he had a fall back in January of this year resulting in a left intertrochanteric hip fracture. He was seen by Dr. Romeo Goodwin and underwent open internal fixation with a short gamma nail on 09/13/2015. No obvious perioperative complications.  He presents today for a follow-up visit. He is using a cane but is made significant improvement. He is recently swimming in [place] again, using his paddleboat on the leg. He has not been back on the bicycle. He does not report any angina symptoms or unusual shortness of breath with activities. He is able to swim for an hour in [place] as before.  I reviewed his ECG from January. He has had no changes in his medications, essentially on aspirin and propranolol at this time. He has a prior history of statin intolerance. Last LDL was 120, but he has HDL in the 70-80 range.  Past Medical History  Diagnosis Date  . Tremor     Right hand  . Carpal tunnel syndrome   . Arthritis   . GERD (gastroesophageal reflux disease)   . Dry eyes   . Coronary atherosclerosis of native coronary artery     DES mid LCx/OM2 12/2013  . Hyperlipidemia   . Neck pain   . Nocturia   . History of shingles     Past Surgical History  Procedure Laterality Date  . Arthroscopic left knee surgery  2005/2013  . Left inguinal herniorrhaphy  2008  . Carpal tunnel repair Right 1998  . Left heart catheterization with coronary angiogram N/A 12/11/2013    Procedure: LEFT HEART CATHETERIZATION WITH CORONARY ANGIOGRAM;  Surgeon: Jeffery M SwazilandJordan, MD;  [facility];  Service: Cardiovascular;  Laterality: N/A;  . Colonoscopy    . Anterior  cervical decomp/discectomy fusion N/A 12/20/2014    Procedure: ANTERIOR CERVICAL DECOMPRESSION/DISCECTOMY FUSION 3 LEVELS;  Surgeon: Jeffery SicksHenry Pool, MD;  Location: [facility]UR[facility];  Service: Neurosurgery;  Laterality: N/A;  ANTERIOR CERVICAL DECOMPRESSION/DISCECTOMY FUSION 3 LEVELS C3-4,4-5,5-6  . Intramedullary (im) nail intertrochanteric Left 09/13/2015    Procedure: INTRAMEDULLARY  NAIL INTERTROCHANTRIC;  Surgeon: Jeffery HearingStanley E Harrison, MD;  Location: [facility];  Service: Orthopedics;  Laterality: Left;    Current Outpatient Prescriptions  Medication Sig Dispense Refill  . cholecalciferol (VITAMIN D) 1000 units tablet Take 1,000 Units by mouth daily.    Marland Kitchen. aspirin 81 MG chewable tablet Chew 81 mg by mouth daily.     Marland Kitchen. CINNAMON PO Take 1 each by mouth daily. Take 1 spoonful with yogurt daily    . COCONUT OIL PO Take 1 each by mouth daily. Take 1 spoonful by mouth daily    . Cyanocobalamin (B-12) 3000 MCG CAPS Take 1 capsule by mouth daily.    . Flaxseed, Linseed, (FLAX SEEDS PO) Take 1 each by mouth daily. Take 1 spoonful with yogurt daily    . KRILL OIL PO Take 750 mg by mouth daily.    . Misc Natural Products (GLUCOS-CHONDROIT-MSM COMPLEX PO) Take 3 tablets by mouth every morning.     . Omega-3 Fatty Acids (FISH OIL) 1200 MG CAPS Take 3,600 mg by mouth daily.    .Marland Kitchen  propranolol (INDERAL) 10 MG tablet Take 5 mg by mouth.    . Saw Palmetto 450 MG CAPS Take 450 mg by mouth daily.    . Tetrahydrozoline HCl (VISINE OP) Place 1 drop into both eyes 2 (two) times daily.    . vitamin C (ASCORBIC ACID) 500 MG tablet Take 500 mg by mouth daily.     No current facility-administered medications for this visit.   Allergies:  Asa   Social History: The patient  reports that he quit smoking about 7 years ago. His smoking use included Cigarettes. He started smoking about 43 years ago. He smoked 0.00 packs per day for 0 years. He has never used smokeless tobacco. He reports that he drinks alcohol. He reports that he does  not use illicit drugs.   ROS:  Please see the history of present illness. Otherwise, complete review of systems is positive for improving hip stiffness.  All other systems are reviewed and negative.   Physical Exam: VS:  BP 138/80 mmHg  Pulse 52  Ht  (1.854 m)  Wt 210 lb (95.255 kg)  BMI 27.71 kg/m2  SpO2 98%, BMI Body mass index is 27.71 kg/(m^2).  Wt Readings from Last 3 Encounters:  12/05/15 210 lb (95.255 kg)  10/10/15 215 lb 8 oz (97.75 kg)  09/26/15 215 lb 8 oz (97.75 kg)    Appears comfortable at rest. Using a cane. HEENT: Conjunctiva and lids normal, oropharynx clear.  Neck: Supple, no elevated JVP or carotid bruits, no thyromegaly.  Lungs: Clear to auscultation, nonlabored breathing at rest.  Cardiac: Regular rate and rhythm, no S3 or significant systolic murmur, no pericardial rub.  Abdomen: Soft, nontender, bowel sounds present, no guarding or rebound.  Extremities: No pitting edema, distal pulses 2+. Skin: Warm and dry. Musculoskeletal: No kyphosis. Neuropsychiatric: Alert and oriented 3, affect appropriate.  ECG: I personally reviewed the prior tracing from 09/13/2015 which showed sinus rhythm with IVCD and possible left atrial enlargement.  Recent Labwork: 09/15/2015: BUN 20; Creatinine, Ser 1.11; Potassium 4.0; Sodium 138 09/16/2015: Hemoglobin 11.1*; Platelets 179     Component Value Date/Time   CHOL 204* 06/01/2015 0856   TRIG 59 06/01/2015 0856   HDL 72 06/01/2015 0856   CHOLHDL 2.8 06/01/2015 0856   VLDL 12 06/01/2015 0856   LDLCALC 120 06/01/2015 0856    Other Studies Reviewed Today:  Echocardiogram 12/28/2013: Study Conclusions  - Procedure narrative: Transthoracic echocardiography. Image quality was suboptimal. The study was technically difficult, as a result of poor sound wave transmission. - Left ventricle: The cavity size was mildly dilated. Systolic function was low normal to mildly reduced. The estimated ejection fraction  was in the range of 45% to 50%. Mild global hypokinesis. There was an increased relative contribution of atrial contraction to ventricular filling. Doppler parameters are consistent with abnormal left ventricular relaxation (grade 1 diastolic dysfunction). Mild basal septal hypertrophy. - Mitral valve: Mild regurgitation. - Left atrium: The atrium was moderately dilated.  Assessment and Plan:  1. CAD status post DES to the circumflex/OM 2 and April 2015. He continues to do well without active angina symptoms. I reviewed his ECG from January. Plan to continue with exercise plan as tolerated as he recuperates from hip surgery in January. Continue aspirin and propranolol.  2. Hyperlipidemia with statin intolerance. He is on a number of supplements, has also modified his diet significantly. He will have a follow-up lipid pounds with Dr. Juanetta Gosling in December of this year.  Current medicines were reviewed  with the patient today.  Disposition: FU with me in 6 months.   Signed, Jonelle Sidle, MD, [facility] 12/05/2015 2:05 PM    [facility] at [facility] [address] [place], [place] [address] Phone: [phone]; Fax: [phone]

## 2015-12-05 NOTE — Patient Instructions (Signed)
Your physician wants you to follow-up in: 6 months with Dr McDowell You will receive a reminder letter in the mail two months in advance. If you don't receive a letter, please call our office to schedule the follow-up appointment.     Your physician recommends that you continue on your current medications as directed. Please refer to the Current Medication list given to you today.    If you need a refill on your cardiac medications before your next appointment, please call your pharmacy.     Thank you for choosing [facility] !        

## 2015-12-06 ENCOUNTER — Ambulatory Visit (INDEPENDENT_AMBULATORY_CARE_PROVIDER_SITE_OTHER): Payer: Medicare HMO

## 2015-12-06 ENCOUNTER — Ambulatory Visit (INDEPENDENT_AMBULATORY_CARE_PROVIDER_SITE_OTHER): Payer: Medicare HMO | Admitting: Orthopedic Surgery

## 2015-12-06 ENCOUNTER — Encounter: Payer: Self-pay | Admitting: Orthopedic Surgery

## 2015-12-06 VITALS — BP 122/69 | Ht 73.0 in | Wt 210.0 lb

## 2015-12-06 DIAGNOSIS — S72142D Displaced intertrochanteric fracture of left femur, subsequent encounter for closed fracture with routine healing: Secondary | ICD-10-CM

## 2015-12-06 NOTE — Patient Instructions (Signed)
Bone Density ORDERED. We will contact your insurance company for pre-certification. After we receive that, we will schedule you an appointment for the Bone Density and contact you. If you have not heard from our office in one week, contact us.

## 2015-12-06 NOTE — Progress Notes (Signed)
Patient ID: Jeffery Goodwin, male   DOB: 07/29/1951, 65 y.o.   MRN: [id number]  Chief Complaint  Patient presents with  . Follow-up    follow up + xray left hip fx, DOS 09/13/15    HPI-Left hip inotrope fracture. 12 weeks out. (84 days) Gamma nail. She complains of soreness in his left hip. He is able to ride the bicycle for 45 minutes. He swimming proximal and one hour. However, he still has a cane and he still has a slight limp  ROS  BP 122/69 mmHg  Ht 6\' 1"  (1.854 m)  Wt 210 lb (95.255 kg)  BMI 27.71 kg/m2  Physical Exam  Constitutional: He is oriented to person, place, and time. He appears well-developed and well-nourished. No distress.  Cardiovascular: Normal rate.   Musculoskeletal:  He does walk with a limp. He seems to be level and looking at his pelvis. He has normal abduction strength even with repeated abduction so there is no weakness there  He says he feels level    Neurological: He is alert and oriented to person, place, and time.  Skin: Skin is warm and dry. No rash noted. He is not diaphoretic. No erythema. No pallor.  Psychiatric: He has a normal mood and affect. His behavior is normal. Judgment and thought content normal.    Ortho Exam    ASSESSMENT AND PLAN   The x-ray shows a compressed intertrochanteric fracture nail looks good  Recommend continue exercising use cane as needed follow-up in a month we also got a bone density due to his strong family history of osteoporosis

## 2015-12-07 ENCOUNTER — Other Ambulatory Visit: Payer: Self-pay | Admitting: *Deleted

## 2015-12-07 ENCOUNTER — Telehealth: Payer: Self-pay | Admitting: *Deleted

## 2015-12-07 NOTE — Telephone Encounter (Signed)
BONE DENSITY APPOINTMENT  12/15/15 11:15AM, [facility]  NO CALCIUM SUPPLEMENTS X 48 HOURS BEFORE SCAN  BRING FULL MEDICATION LIST  CALLED PATIENT, NO ANSWER, LEFT MESSAGE TO RETURN CALL

## 2015-12-07 NOTE — Telephone Encounter (Signed)
Patient aware.

## 2015-12-15 ENCOUNTER — Ambulatory Visit (HOSPITAL_COMMUNITY)
Admission: RE | Admit: 2015-12-15 | Discharge: 2015-12-15 | Disposition: A | Discharge status: Home / Self Care | Payer: Medicare HMO | Source: Ambulatory Visit | Attending: Orthopedic Surgery | Admitting: Orthopedic Surgery

## 2015-12-15 DIAGNOSIS — S72142D Displaced intertrochanteric fracture of left femur, subsequent encounter for closed fracture with routine healing: Secondary | ICD-10-CM | POA: Diagnosis not present

## 2015-12-15 DIAGNOSIS — M8589 Other specified disorders of bone density and structure, multiple sites: Secondary | ICD-10-CM | POA: Insufficient documentation

## 2016-01-09 ENCOUNTER — Encounter: Payer: Self-pay | Admitting: Orthopedic Surgery

## 2016-01-09 ENCOUNTER — Ambulatory Visit (INDEPENDENT_AMBULATORY_CARE_PROVIDER_SITE_OTHER): Payer: Medicare HMO | Admitting: Orthopedic Surgery

## 2016-01-09 VITALS — BP 135/74 | Ht 73.0 in | Wt 210.0 lb

## 2016-01-09 DIAGNOSIS — M8580 Other specified disorders of bone density and structure, unspecified site: Secondary | ICD-10-CM

## 2016-01-09 DIAGNOSIS — S72142D Displaced intertrochanteric fracture of left femur, subsequent encounter for closed fracture with routine healing: Secondary | ICD-10-CM | POA: Diagnosis not present

## 2016-01-09 NOTE — Progress Notes (Signed)
Patient ID: Jeffery Goodwin, male   DOB: 01/12/1951, 65 y.o.   MRN: [id number] Chief complaint is follow-up status post gamma nail left hip for fracture and review results of bone density study   HPI patient reports improvement overall regarding his left hip with limping only occurring when he has long walks. He denies a positive family history of paresis and inability osteoporosis area of his T score came back as -1.8 indicating osteopenia he's already on vitamin D3 2000 units per day   Hip fracture surgery was 09/13/2015   ROS  No fever chills or fatigue  BP 135/74 mmHg  Ht 6\' 1"  (1.854 m)  Wt 210 lb (95.255 kg)  BMI 27.71 kg/m2  Physical Exam Physical Exam  Constitutional: The patient is oriented to person, place, and time. The patient appears well-developed and well-nourished. No distress.  Cardiovascular: Intact distal pulses.   Neurological: The patient is alert and oriented to person, place, and time. The patient exhibits normal muscle tone. Coordination normal.  Skin: Skin is warm and dry. No rash noted. The patient is not diaphoretic. No erythema. No pallor.  Psychiatric: The patient has a normal mood and affect. Her behavior is normal. Judgment and thought content normal.   Ortho Exam Musculoskeletal Gait slightly notable for weakness in the abductors   ASSESSMENT AND PLAN   T score and report reviewed he has osteopenia.  He's already on vitamin D 3  I will forward these over to Dr. Juanetta GoslingHawkins I recommend he start calcium. I feel with his level of activity does not need to go on further medication. We can monitor his T score in 6-12 months and if he deteriorates or doesn't improve with the calcium and vitamin D3 then reconsider medication per Dr. Juanetta GoslingHawkins recommendation

## 2016-06-21 ENCOUNTER — Telehealth: Payer: Self-pay | Admitting: Cardiology

## 2016-06-21 NOTE — Telephone Encounter (Signed)
Pt is having a root cannel and they want him to take Doxycycline 100mg  for 1 wk, he just wants to make sure its ok for him to take this. Please give him a call @ [phone] cell or home # [phone]

## 2016-06-21 NOTE — Telephone Encounter (Signed)
Patient notified that It is ok to take Doxycycline

## 2016-08-13 ENCOUNTER — Ambulatory Visit (INDEPENDENT_AMBULATORY_CARE_PROVIDER_SITE_OTHER): Payer: Medicare HMO | Admitting: Orthopedic Surgery

## 2016-08-13 ENCOUNTER — Encounter: Payer: Self-pay | Admitting: Orthopedic Surgery

## 2016-08-13 DIAGNOSIS — S72142D Displaced intertrochanteric fracture of left femur, subsequent encounter for closed fracture with routine healing: Secondary | ICD-10-CM | POA: Diagnosis not present

## 2016-08-13 NOTE — Progress Notes (Signed)
Patient ID: Jeffery Goodwin, male   DOB: 01/02/1951, 65 y.o.   MRN: [id number]  Chief Complaint  Patient presents with  . Follow-up    left hip fracture, IM NAIL 09/13/15    HPI Jeffery Goodwin is a 65 y.o. male.   HPI  Months after IM nail left hip fracture inotrope fracture he does have osteopenia  He wishes to discuss treatment options he is on vitamin D and calcium he exercises daily alternating between weights, spinning class for 45 minutes and an hour swimming has an occasional ache that'll go away and he felt that when he was canoeing yesterday    Review of Systems Review of Systems  Musculoskeletal: Negative for back pain.    Physical Exam  Equal hip flexion equal leg lengths no tenderness to palpation in either hip for range of motion in both hips  He does have mild pes planus in each foot in the standing position  Neurovascular exam normal both legs  Encounter Diagnosis  Name Primary?  . Intertrochanteric fracture of left hip, closed, with routine healing, subsequent encounter Yes    AS NEEDED  He wanted his orthotics are reordered so we did that with a three-eighths inch lift in a cut out for the heel as he did have some plantar fascial pain

## 2016-09-07 ENCOUNTER — Ambulatory Visit (INDEPENDENT_AMBULATORY_CARE_PROVIDER_SITE_OTHER): Payer: Medicare HMO | Admitting: Cardiology

## 2016-09-07 ENCOUNTER — Encounter: Payer: Self-pay | Admitting: Cardiology

## 2016-09-07 VITALS — BP 124/76 | HR 61 | Ht 72.5 in | Wt 213.0 lb

## 2016-09-07 DIAGNOSIS — Z789 Other specified health status: Secondary | ICD-10-CM

## 2016-09-07 DIAGNOSIS — Z23 Encounter for immunization: Secondary | ICD-10-CM | POA: Diagnosis not present

## 2016-09-07 DIAGNOSIS — I2510 Atherosclerotic heart disease of native coronary artery without angina pectoris: Secondary | ICD-10-CM

## 2016-09-07 NOTE — Patient Instructions (Signed)
Your physician wants you to follow-up in: 6 months Dr Randa SpikeMcdowell You will receive a reminder letter in the mail two months in advance. If you don't receive a letter, please call our office to schedule the follow-up appointment.    Your physician recommends that you continue on your current medications as directed. Please refer to the Current Medication list given to you today.    Thank you for choosing [facility] !

## 2016-09-07 NOTE — Progress Notes (Signed)
Cardiology Office Note  Date: 09/07/2016   ID: Jeffery, Goodwin 1951-06-22, MRN [id number]  PCP: Jeffery Maudlin, MD  Primary Cardiologist: Jeffery Dell, MD   Chief Complaint  Patient presents with  . Coronary Artery Disease    History of Present Illness: Jeffery Goodwin is a 66 y.o. male last seen in April 2017. He presents for a routine follow-up visit. Continues to do well at this time, has gotten over his hip surgery and is back to regular exercise. He swims an hour at the [facility] 3 days a week, otherwise enjoys spinning at home and also working out with weights. He has no angina symptoms.  I reviewed his medications which are outlined below. Current cardiac regimen includes aspirin, flaxseed, omega-3 supplements, and Inderal. He has a history of statin intolerance. Recently had lipids with Dr. Juanetta Goodwin and is trying to lose more weight to get his numbers back down further. HDL was in the 70s however.  DES to the circumflex/OM was in April 2015. We have discussed the possibility of follow-up ischemic testing, but have decided to hold off in light of his clinical stability with a very good exercise plan.  Past Medical History:  Diagnosis Date  . Arthritis   . Carpal tunnel syndrome   . Coronary atherosclerosis of native coronary artery    DES mid LCx/OM2 12/2013  . Dry eyes   . GERD (gastroesophageal reflux disease)   . History of shingles   . Hyperlipidemia   . Neck pain   . Nocturia   . Tremor    Right hand    Past Surgical History:  Procedure Laterality Date  . ANTERIOR CERVICAL DECOMP/DISCECTOMY FUSION N/A 12/20/2014   Procedure: ANTERIOR CERVICAL DECOMPRESSION/DISCECTOMY FUSION 3 LEVELS;  Surgeon: Jeffery Sicks, MD;  Location: [facility] NEUR[facility];  Service: Neurosurgery;  Laterality: N/A;  ANTERIOR CERVICAL DECOMPRESSION/DISCECTOMY FUSION 3 LEVELS C3-4,4-5,5-6  . Arthroscopic left knee surgery  2005/2013  . Carpal tunnel repair Right 1998  . COLONOSCOPY    . INTRAMEDULLARY  (IM) NAIL INTERTROCHANTERIC Left 09/13/2015   Procedure: INTRAMEDULLARY  NAIL INTERTROCHANTRIC;  Surgeon: Jeffery Hearing, MD;  Location: [facility];  Service: Orthopedics;  Laterality: Left;  . LEFT HEART CATHETERIZATION WITH CORONARY ANGIOGRAM N/A 12/11/2013   Procedure: LEFT HEART CATHETERIZATION WITH CORONARY ANGIOGRAM;  Surgeon: Jeffery M Swaziland, MD;  Location: [facility] CATH LAB;  Service: Cardiovascular;  Laterality: N/A;  . Left inguinal herniorrhaphy  2008    Current Outpatient Prescriptions  Medication Sig Dispense Refill  . aspirin 81 MG chewable tablet Chew 81 mg by mouth daily.     . cholecalciferol (VITAMIN D) 1000 units tablet Take 1,000 Units by mouth daily.    Marland Kitchen CINNAMON PO Take 1 each by mouth daily. Take 1 spoonful with yogurt daily    . Cyanocobalamin (B-12) 3000 MCG CAPS Take 1 capsule by mouth daily.    . Flaxseed, Linseed, (FLAX SEEDS PO) Take 1 each by mouth daily. Take 1 spoonful with yogurt daily    . KRILL OIL PO Take 750 mg by mouth daily.    . Misc Natural Products (GLUCOS-CHONDROIT-MSM COMPLEX PO) Take 3 tablets by mouth every morning.     . Omega-3 Fatty Acids (FISH OIL) 1200 MG CAPS Take 3,600 mg by mouth daily.    . propranolol (INDERAL) 10 MG tablet Take 5 mg by mouth.    . Saw Palmetto 450 MG CAPS Take 450 mg by mouth daily.    . Tetrahydrozoline HCl (VISINE  OP) Place 1 drop into both eyes 2 (two) times daily.    . vitamin C (ASCORBIC ACID) 500 MG tablet Take 500 mg by mouth daily.     No current facility-administered medications for this visit.    Allergies:  Asa [aspirin]   Social History: The patient  reports that he quit smoking about 8 years ago. His smoking use included Cigarettes. He started smoking about 44 years ago. He smoked 0.00 packs per day for 0.00 years. He has never used smokeless tobacco. He reports that he drinks alcohol. He reports that he does not use drugs.   ROS:  Please see the history of present illness. Otherwise, complete review of systems  is positive for mild left knee pain.  All other systems are reviewed and negative.   Physical Exam: VS:  BP 124/76   Pulse 61   Ht 6' 0.5" (1.842 m)   Wt 213 lb (96.6 kg)   SpO2 96%   BMI 28.49 kg/m , BMI Body mass index is 28.49 kg/m.  Wt Readings from Last 3 Encounters:  09/07/16 213 lb (96.6 kg)  01/09/16 210 lb (95.3 kg)  12/06/15 210 lb (95.3 kg)    Appears comfortable at rest. HEENT: Conjunctiva and lids normal, oropharynx clear.  Neck: Supple, no elevated JVP or carotid bruits, no thyromegaly.  Lungs: Clear to auscultation, nonlabored breathing at rest.  Cardiac: Regular rate and rhythm, no S3 or significant systolic murmur, no pericardial rub.  Abdomen: Soft, nontender, bowel sounds present, no guarding or rebound.  Extremities: No pitting edema, distal pulses 2+. Skin: Warm and dry. Musculoskeletal: No kyphosis. Neuropsychiatric: Alert and oriented 3, affect appropriate.  ECG: I personally reviewed the tracing from 09/13/2015 which showed sinus rhythm with IVCD and possible left atrial enlargement.  Recent Labwork: 09/15/2015: BUN 20; Creatinine, Ser 1.11; Potassium 4.0; Sodium 138 09/16/2015: Hemoglobin 11.1; Platelets 179     Component Value Date/Time   CHOL 204 (H) 06/01/2015 0856   TRIG 59 06/01/2015 0856   HDL 72 06/01/2015 0856   CHOLHDL 2.8 06/01/2015 0856   VLDL 12 06/01/2015 0856   LDLCALC 120 06/01/2015 0856    Other Studies Reviewed Today:  Echocardiogram 12/28/2013: Study Conclusions  - Procedure narrative: Transthoracic echocardiography. Image quality was suboptimal. The study was technically difficult, as a result of poor sound wave transmission. - Left ventricle: The cavity size was mildly dilated. Systolic function was low normal to mildly reduced. The estimated ejection fraction was in the range of 45% to 50%. Mild global hypokinesis. There was an increased relative contribution of atrial contraction to  ventricular filling. Doppler parameters are consistent with abnormal left ventricular relaxation (grade 1 diastolic dysfunction). Mild basal septal hypertrophy. - Mitral valve: Mild regurgitation. - Left atrium: The atrium was moderately dilated.  Assessment and Plan:  1. CAD status post DES to the circumflex/OM in April 2015. He is doing very well without angina on medical therapy and participating in a very structured exercise program. We will continue with observation.  2. Hyperlipidemia with statin intolerance. He is going to try and lose more weight over the next 6 months.  Current medicines were reviewed with the patient today.   Orders Placed This Encounter  Procedures  . Flu Vaccine QUAD 36+ mos IM    Disposition: Follow-up in 6 months.  Signed, Jonelle SidleSamuel G. McDowell, MD, [facility] 09/07/2016 12:05 PM    [facility] at [facility] [address], [place], [place] [address] Phone: [phone];  Fax: [phone]

## 2016-11-21 ENCOUNTER — Telehealth: Payer: Self-pay | Admitting: Cardiology

## 2016-11-21 NOTE — Telephone Encounter (Signed)
Patient notified and states that he may try 1 tablet daily

## 2016-11-21 NOTE — Telephone Encounter (Signed)
Will forward to Dr. McDowell 

## 2016-11-21 NOTE — Telephone Encounter (Signed)
I am not overly familiar with this, but I think there is some association with increased bleeding risk on antiplatelet agent such as aspirin. He would need to keep an eye out for anything unusual, but probably no direct contraindication to trying it.

## 2016-11-21 NOTE — Telephone Encounter (Signed)
Pt is wondering if it's ok for him to Turmeric 500mg 

## 2017-02-20 ENCOUNTER — Encounter: Payer: Self-pay | Admitting: Cardiology

## 2017-03-08 ENCOUNTER — Ambulatory Visit: Payer: Medicare HMO | Admitting: Cardiology

## 2017-03-12 NOTE — Progress Notes (Signed)
Cardiology Office Note  Date: 03/13/2017   ID: Jeffery Goodwin, DOB 12/23/1950, MRN [id number]  PCP: Kari BaarsHawkins, Edward, MD  Primary Cardiologist: Nona DellSamuel Yanelle Sousa, MD   Chief Complaint  Patient presents with  . Coronary Artery Disease    History of Present Illness: Jeffery Goodwin is a 66 y.o. male last seen in January. He presents for a routine follow-up visit. He continues to do very well in terms of his exercise plan. Still swims a mile 3 days a week, on the other days does spinning. He tells me that he tests himself out by doing sprints while spinning for 5 minutes at a time. He does not report any angina or unusual shortness of breath.  DES to the circumflex/OM was in April 2015. He continues on aspirin, also Inderal. He has statin intolerance, we tried 3 different preparations previously. He does take flaxseed oil extract.  I personally reviewed his ECG today which shows sinus bradycardia in the 40s, leftward axis with IVCD.  Past Medical History:  Diagnosis Date  . Arthritis   . Carpal tunnel syndrome   . Coronary atherosclerosis of native coronary artery    DES mid LCx/OM2 12/2013  . Dry eyes   . GERD (gastroesophageal reflux disease)   . History of shingles   . Hyperlipidemia   . Neck pain   . Nocturia   . Tremor    Right hand    Past Surgical History:  Procedure Laterality Date  . ANTERIOR CERVICAL DECOMP/DISCECTOMY FUSION N/A 12/20/2014   Procedure: ANTERIOR CERVICAL DECOMPRESSION/DISCECTOMY FUSION 3 LEVELS;  Surgeon: Julio SicksHenry Pool, MD;  Location: [facility];  Service: Neurosurgery;  Laterality: N/A;  ANTERIOR CERVICAL DECOMPRESSION/DISCECTOMY FUSION 3 LEVELS C3-4,4-5,5-6  . Arthroscopic left knee surgery  2005/2013  . Carpal tunnel repair Right 1998  . COLONOSCOPY    . INTRAMEDULLARY (IM) NAIL INTERTROCHANTERIC Left 09/13/2015   Procedure: INTRAMEDULLARY  NAIL INTERTROCHANTRIC;  Surgeon: Vickki HearingStanley E Harrison, MD;  Location: [facility];  Service: Orthopedics;  Laterality:  Left;  . LEFT HEART CATHETERIZATION WITH CORONARY ANGIOGRAM N/A 12/11/2013   Procedure: LEFT HEART CATHETERIZATION WITH CORONARY ANGIOGRAM;  Surgeon: Peter M SwazilandJordan, MD;  [facility];  Service: Cardiovascular;  Laterality: N/A;  . Left inguinal herniorrhaphy  2008    Current Outpatient Prescriptions  Medication Sig Dispense Refill  . aspirin 81 MG chewable tablet Chew 81 mg by mouth daily.     . cholecalciferol (VITAMIN D) 1000 units tablet Take 1,000 Units by mouth daily.    Marland Kitchen. CINNAMON PO Take 1 each by mouth daily. Take 1 spoonful with yogurt daily    . Cyanocobalamin (B-12) 3000 MCG CAPS Take 1 capsule by mouth daily.    . Flaxseed, Linseed, (FLAX SEEDS PO) Take 1 each by mouth daily. Take 1 spoonful with yogurt daily    . KRILL OIL PO Take 750 mg by mouth daily.    . Misc Natural Products (GLUCOS-CHONDROIT-MSM COMPLEX PO) Take 3 tablets by mouth every morning.     . Probiotic Product (PROBIOTIC-10 ULTIMATE PO) Take by mouth.    . propranolol (INDERAL) 10 MG tablet Take 5 mg by mouth 2 (two) times daily.     . Saw Palmetto 450 MG CAPS Take 450 mg by mouth daily.    . Tetrahydrozoline HCl (VISINE OP) Place 1 drop into both eyes 2 (two) times daily.    . vitamin C (ASCORBIC ACID) 500 MG tablet Take 500 mg by mouth daily.  No current facility-administered medications for this visit.    Allergies:  Asa [aspirin]   Social History: The patient  reports that he quit smoking about 8 years ago. His smoking use included Cigarettes. He started smoking about 44 years ago. He smoked 0.00 packs per day for 0.00 years. He has never used smokeless tobacco. He reports that he drinks alcohol. He reports that he does not use drugs.   Family History: The patient's family history includes Brain cancer in his father; Colon cancer in his mother.   ROS:  Please see the history of present illness. Otherwise, complete review of systems is positive for none.  All other systems are reviewed and  negative.   Physical Exam: VS:  BP 126/77   Pulse (!) 45   Ht 6\' 1"  (1.854 m)   Wt 210 lb (95.3 kg)   SpO2 98% Comment: on room air  BMI 27.71 kg/m , BMI Body mass index is 27.71 kg/m.  Wt Readings from Last 3 Encounters:  03/13/17 210 lb (95.3 kg)  09/07/16 213 lb (96.6 kg)  01/09/16 210 lb (95.3 kg)    Appears comfortable at rest. HEENT: Conjunctiva and lids normal, oropharynx clear.  Neck: Supple, no elevated JVP or carotid bruits, no thyromegaly.  Lungs: Clear to auscultation, nonlabored breathing at rest.  Cardiac: Regular rate and rhythm, no S3 or significant systolic murmur, no pericardial rub.  Abdomen: Soft, nontender, bowel sounds present, no guarding or rebound.  Extremities: No pitting edema, distal pulses 2+. Skin: Warm and dry. Musculoskeletal: No kyphosis. Neuropsychiatric: Alert and oriented 3, affect appropriate.  ECG: I personally reviewed the tracing from 09/13/2015 which showed sinus rhythm with IVCD and possible left atrial enlargement.  Recent Labwork:  09/15/2015: BUN 20; Creatinine, Ser 1.11; Potassium 4.0; Sodium 138 09/16/2015: Hemoglobin 11.1; Platelets 179   Other Studies Reviewed Today:  Echocardiogram 12/28/2013: Study Conclusions  - Procedure narrative: Transthoracic echocardiography. Image quality was suboptimal. The study was technically difficult, as a result of poor sound wave transmission. - Left ventricle: The cavity size was mildly dilated. Systolic function was low normal to mildly reduced. The estimated ejection fraction was in the range of 45% to 50%. Mild global hypokinesis. There was an increased relative contribution of atrial contraction to ventricular filling. Doppler parameters are consistent with abnormal left ventricular relaxation (grade 1 diastolic dysfunction). Mild basal septal hypertrophy. - Mitral valve: Mild regurgitation. - Left atrium: The atrium was moderately dilated.  Assessment and  Plan:  1. CAD status post DES to the circumflex/OM in April 2015. He continues to be asymptomatic in terms of angina with a very vigorous exercise plan. I reviewed his ECG which shows no significant changes. We will continue with observation for now.  2. Hyperlipidemia with statin intolerance. Requesting most recent lipid panel from Dr. Juanetta Gosling. We have discussed other options as well.  Current medicines were reviewed with the patient today.   Orders Placed This Encounter  Procedures  . EKG 12-Lead    Disposition: Follow-up in 6 months.  Signed, Jonelle Sidle, MD, [facility] 03/13/2017 1:01 PM     [facility] [address], [address], [place] [address] Phone: [phone]; Fax: [phone]

## 2017-03-13 ENCOUNTER — Encounter: Payer: Self-pay | Admitting: Cardiology

## 2017-03-13 ENCOUNTER — Ambulatory Visit (INDEPENDENT_AMBULATORY_CARE_PROVIDER_SITE_OTHER): Payer: Medicare HMO | Admitting: Cardiology

## 2017-03-13 VITALS — BP 126/77 | HR 45 | Ht 73.0 in | Wt 210.0 lb

## 2017-03-13 DIAGNOSIS — E782 Mixed hyperlipidemia: Secondary | ICD-10-CM | POA: Diagnosis not present

## 2017-03-13 DIAGNOSIS — I2510 Atherosclerotic heart disease of native coronary artery without angina pectoris: Secondary | ICD-10-CM | POA: Diagnosis not present

## 2017-03-13 DIAGNOSIS — Z789 Other specified health status: Secondary | ICD-10-CM | POA: Diagnosis not present

## 2017-03-13 NOTE — Patient Instructions (Signed)

## 2017-03-14 ENCOUNTER — Telehealth: Payer: Self-pay | Admitting: Orthopedic Surgery

## 2017-03-14 NOTE — Telephone Encounter (Signed)
[facility] as he would like to have a spare.  Last seen for his hip 08/13/16.  Please advise regarding the Rx at cell# [phone]

## 2017-03-18 ENCOUNTER — Other Ambulatory Visit: Payer: Self-pay | Admitting: *Deleted

## 2017-03-18 DIAGNOSIS — M2141 Flat foot [pes planus] (acquired), right foot: Secondary | ICD-10-CM

## 2017-03-18 DIAGNOSIS — M2142 Flat foot [pes planus] (acquired), left foot: Principal | ICD-10-CM

## 2017-03-18 NOTE — Telephone Encounter (Signed)
OKAY TO WRITE A SCRIPT?

## 2017-03-18 NOTE — Telephone Encounter (Signed)
OK 

## 2017-03-18 NOTE — Telephone Encounter (Signed)
Notified patient.

## 2017-03-18 NOTE — Telephone Encounter (Signed)
Prescription available for pick up 

## 2017-04-05 ENCOUNTER — Telehealth: Payer: Self-pay | Admitting: Cardiology

## 2017-04-05 NOTE — Telephone Encounter (Signed)
Please give pt a call concerning a call [facility] received [facility]. He has no idea what medication they are talking about and why he would need that. He can be reached @ [phone]

## 2017-04-08 NOTE — Telephone Encounter (Signed)
Called patient. No answer. Left message to call back.  

## 2017-04-11 NOTE — Telephone Encounter (Signed)
Called pt., no answer. Left message for pt to return call.  

## 2017-04-12 NOTE — Telephone Encounter (Signed)
Called pt. No answer. I will mail a letter to pt to call our office.

## 2017-06-26 ENCOUNTER — Encounter: Payer: Self-pay | Admitting: Internal Medicine

## 2017-06-26 ENCOUNTER — Telehealth: Payer: Self-pay | Admitting: Internal Medicine

## 2017-06-26 NOTE — Telephone Encounter (Signed)
Pt has questions for the nurse prior to scheduling OV. He has had several health issues and wanted to know if it would cause problems with having a colonoscopy. He is also wanting to have EGD and Colonoscopy done the same time which he would need an OV for that, but he wants to speak with nurse first. Please call him at [phone]

## 2017-06-26 NOTE — Telephone Encounter (Signed)
Spoke with Jeffery Goodwin and he wasn't sure if he could do a TCS with hemorrhoids and a small hernia. Looking back at the notes, Jeffery Goodwin will need to f/u with a Dr. Jena Gaussourk to discuss his symptoms.   Jeffery Goodwin does get some bleeding with hemorrhoids and has a bowel movement less frequent since he d/c red meat and increase his fruits and veggies.   Routing to schedule apt

## 2017-06-26 NOTE — Telephone Encounter (Signed)
OV made and letter mailed °

## 2017-07-09 ENCOUNTER — Ambulatory Visit: Payer: Medicare HMO | Admitting: Internal Medicine

## 2017-07-09 ENCOUNTER — Encounter: Payer: Self-pay | Admitting: *Deleted

## 2017-07-09 ENCOUNTER — Other Ambulatory Visit: Payer: Self-pay | Admitting: *Deleted

## 2017-07-09 ENCOUNTER — Telehealth: Payer: Self-pay | Admitting: *Deleted

## 2017-07-09 ENCOUNTER — Encounter: Payer: Self-pay | Admitting: Internal Medicine

## 2017-07-09 VITALS — BP 134/89 | HR 50 | Temp 98.4°F | Ht 73.0 in | Wt 212.8 lb

## 2017-07-09 DIAGNOSIS — K921 Melena: Secondary | ICD-10-CM | POA: Diagnosis not present

## 2017-07-09 DIAGNOSIS — K625 Hemorrhage of anus and rectum: Secondary | ICD-10-CM

## 2017-07-09 MED ORDER — NA SULFATE-K SULFATE-MG SULF 17.5-3.13-1.6 GM/177ML PO SOLN: 1.0000 | ORAL | 0 refills | Status: DC

## 2017-07-09 NOTE — Patient Instructions (Signed)
Schedule a diagnosotic colonoscopy - propofol - Rectal bleeding.  Split prep  Further recommedadtions to follow

## 2017-07-09 NOTE — Telephone Encounter (Signed)
Patient called back and stated he will be out of town on 09/02/17 and is unable to do his TCS that day. He is r/s'd for 09/09/17 at 8:30am. New instructions have been mailed to him. He is also scheduled for pre-op 09/04/17 at 10:00am. This appt letter has also been mailed to him. Nothing further needed

## 2017-07-09 NOTE — Telephone Encounter (Signed)
Spoke with pt. Pre-op appt scheduled 08/28/17 at 10:00am. Letter mailed to patient.

## 2017-07-09 NOTE — Progress Notes (Signed)
Primary Care Physician:  Kari BaarsHawkins, Edward, MD Primary Gastroenterologist:  Dr. Jena Gaussourk  Pre-Procedure History & Physical: HPI:  Jeffery Goodwin is a 66 y.o. male here for here for further evaluation of rectal bleeding; patient states he's had intermittent low volume rectal bleeding for years. It has become more frequent, perhaps once weekly.  No abdominal pain. Last colonoscopy 2011 demonstrated some mild anal canal hemorrhoids. Mother with colon cancer but at advanced age. No upper GI tract symptoms. Patient denies melena. He is not taking any nonsteroidal agents except for a half of a baby aspirin daily.  He does strain, lifting weights from time to time.  Past Medical History:  Diagnosis Date  . Arthritis   . Carpal tunnel syndrome   . Coronary atherosclerosis of native coronary artery    DES mid LCx/OM2 12/2013  . Dry eyes   . GERD (gastroesophageal reflux disease)   . History of shingles   . Hyperlipidemia   . Neck pain   . Nocturia   . Tremor    Right hand    Past Surgical History:  Procedure Laterality Date  . Arthroscopic left knee surgery  2005/2013  . Carpal tunnel repair Right 1998  . COLONOSCOPY    . Left inguinal herniorrhaphy  2008    Prior to Admission medications   Medication Sig Start Date End Date Taking? Authorizing Provider  aspirin 81 MG chewable tablet Chew daily by mouth. Takes 1/2 tablet daily   Yes [provider]  cholecalciferol (VITAMIN D) 1000 units tablet Take 2,000 Units daily by mouth.    Yes [provider]  CINNAMON PO Take 1 each by mouth daily. Take 1 spoonful with yogurt daily   Yes [provider]  Cyanocobalamin (B-12) 3000 MCG CAPS Take 1 capsule by mouth daily.   Yes [provider]  Flaxseed, Linseed, (FLAX SEEDS PO) Take 1 each by mouth daily. Take 1 spoonful with yogurt daily   Yes [provider]  KRILL OIL PO Take 750 mg by mouth daily.   Yes [provider]  Misc Natural  Products (GLUCOS-CHONDROIT-MSM COMPLEX PO) Take 3 tablets by mouth every morning.    Yes [provider]  Probiotic Product (PROBIOTIC-10 ULTIMATE PO) Take by mouth.   Yes [provider]  propranolol (INDERAL) 10 MG tablet Take 5 mg by mouth 2 (two) times daily.    Yes [provider]  Saw Palmetto 450 MG CAPS Take 450 mg by mouth daily.   Yes [provider]  Tetrahydrozoline HCl (VISINE OP) Place 1 drop into both eyes 2 (two) times daily.   Yes [provider]  vitamin C (ASCORBIC ACID) 500 MG tablet Take 500 mg by mouth daily.   Yes [provider]    Allergies as of 07/09/2017 - Review Complete 07/09/2017  Allergen Reaction Noted  . Asa [aspirin] Other (See Comments) 09/13/2014    Family History  Problem Relation Age of Onset  . Colon cancer Mother   . Brain cancer Father     Social History   Socioeconomic History  . Marital status: Married    Spouse name: Not on file  . Number of children: 1  . Years of education: Not on file  . Highest education level: Not on file  Social Needs  . Financial resource strain: Not on file  . Food insecurity - worry: Not on file  . Food insecurity - inability: Not on file  . Transportation needs - medical:  Not on file  . Transportation needs - non-medical: Not on file  Occupational History  . Not on file  Tobacco Use  . Smoking status: Former Smoker    Packs/day: 0.00    Years: 0.00    Pack years: 0.00    Types: Cigarettes    Start date: 09/03/1972    Last attempt to quit: 09/03/2008    Years since quitting: 8.8  . Smokeless tobacco: Never Used  . Tobacco comment: quit smoking 6 yrs ago.   Substance and Sexual Activity  . Alcohol use: Yes    Alcohol/week: 0.0 oz    Comment: 2 beers a day  . Drug use: No  . Sexual activity: Yes  Other Topics Concern  . Not on file  Social History Narrative   Self-employed as a Psychologist, occupationalwelder. Remains active including swimming and biking           Review of Systems: See HPI, otherwise negative ROS  Physical Exam: BP 134/89   Pulse (!) 50   Temp 98.4 F (36.9 C)   Ht 6\' 1"  (1.854 m)   Wt 212 lb 12.8 oz (96.5 kg)   BMI 28.08 kg/m  General:   Alert,  Well-developed, well-nourished, pleasant and cooperative in NAD Lungs:  Clear throughout to auscultation.   No wheezes, crackles, or rhonchi. No acute distress. Heart:  Regular rate and rhythm; no murmurs, clicks, rubs,  or gallops. Abdomen: Non-distended, normal bowel sounds.  Soft and nontender without appreciable mass or hepatosplenomegaly.  Pulses:  Normal pulses noted. Extremities:  Without clubbing or edema.  Impression: Patient needs to have a colonoscopy now as the cause of rectal bleeding needs to be sorted out. Pleasant 66 year old gentleman with low volume painless hematochezia. Some degree of chronicity. However, symptoms have worsened. Findings of colonoscopy back in 2011 as outlined above.     Recommendations: I have offered the patient a diagnostic colonoscopy under propofol.  The risks, benefits, limitations, alternatives and imponderables have been reviewed with the patient. Questions have been answered. All parties are agreeable.  Further recommendations to follow.          Notice: This dictation was prepared with Dragon dictation along with smaller phrase technology. Any transcriptional errors that result from this process are unintentional and may not be corrected upon review.

## 2017-08-28 ENCOUNTER — Other Ambulatory Visit (HOSPITAL_COMMUNITY): Payer: Medicare HMO

## 2017-08-29 NOTE — Patient Instructions (Signed)
Jeffery Goodwin  08/29/2017     @PREFPERIOPPHARMACY @   Your procedure is scheduled on  09/09/2017   Report to [facility] at  645   A.M.  Call this number if you have problems the morning of surgery:  [phone]   Remember:  Do not eat food or drink liquids after midnight.  Take these medicines the morning of surgery with A SIP OF WATER  Propranolol.   Do not wear jewelry, make-up or nail polish.  Do not wear lotions, powders, or perfumes, or deodorant.  Do not shave 48 hours prior to surgery.  Men may shave face and neck.  Do not bring valuables to the hospital.  [facility] not responsible for any belongings or valuables.  Contacts, dentures or bridgework may not be worn into surgery.  Leave your suitcase in the car.  After surgery it may be brought to your room.  For patients admitted to the hospital, discharge time will be determined by your treatment team.  Patients discharged the day of surgery will not be allowed to drive home.   Name and phone number of your driver:   family Special instructions:  Follow the diet and prep instructions given to you by Dr Luvenia Starchourk's office.  Please read over the following fact sheets that you were given. Anesthesia Post-op Instructions and Care and Recovery After Surgery       Colonoscopy, Adult A colonoscopy is an exam to look at the entire large intestine. During the exam, a lubricated, bendable tube is inserted into the anus and then passed into the rectum, colon, and other parts of the large intestine. A colonoscopy is often done as a part of normal colorectal screening or in response to certain symptoms, such as anemia, persistent diarrhea, abdominal pain, and blood in the stool. The exam can help screen for and diagnose medical problems, including:  Tumors.  Polyps.  Inflammation.  Areas of bleeding.  Tell a health care provider about:  Any allergies you have.  All medicines you are taking, including  vitamins, herbs, eye drops, creams, and over-the-counter medicines.  Any problems you or family members have had with anesthetic medicines.  Any blood disorders you have.  Any surgeries you have had.  Any medical conditions you have.  Any problems you have had passing stool. What are the risks? Generally, this is a safe procedure. However, problems may occur, including:  Bleeding.  A tear in the intestine.  A reaction to medicines given during the exam.  Infection (rare).  What happens before the procedure? Eating and drinking restrictions Follow instructions from your health care provider about eating and drinking, which may include:  A few days before the procedure - follow a low-fiber diet. Avoid nuts, seeds, dried fruit, raw fruits, and vegetables.  1-3 days before the procedure - follow a clear liquid diet. Drink only clear liquids, such as clear broth or bouillon, black coffee or tea, clear juice, clear soft drinks or sports drinks, gelatin dessert, and popsicles. Avoid any liquids that contain red or purple dye.  On the day of the procedure - do not eat or drink anything during the 2 hours before the procedure, or within the time period that your health care provider recommends.  Bowel prep If you were prescribed an oral bowel prep to clean out your colon:  Take it as told by your health care provider. Starting the day before your procedure, you  will need to drink a large amount of medicated liquid. The liquid will cause you to have multiple loose stools until your stool is almost clear or light green.  If your skin or anus gets irritated from diarrhea, you may use these to relieve the irritation: ? Medicated wipes, such as adult wet wipes with aloe and vitamin E. ? A skin soothing-product like petroleum jelly.  If you vomit while drinking the bowel prep, take a break for up to 60 minutes and then begin the bowel prep again. If vomiting continues and you cannot take  the bowel prep without vomiting, call your health care provider.  General instructions  Ask your health care provider about changing or stopping your regular medicines. This is especially important if you are taking diabetes medicines or blood thinners.  Plan to have someone take you home from the hospital or clinic. What happens during the procedure?  An IV tube may be inserted into one of your veins.  You will be given medicine to help you relax (sedative).  To reduce your risk of infection: ? Your health care team will wash or sanitize their hands. ? Your anal area will be washed with soap.  You will be asked to lie on your side with your knees bent.  Your health care provider will lubricate a long, thin, flexible tube. The tube will have a camera and a light on the end.  The tube will be inserted into your anus.  The tube will be gently eased through your rectum and colon.  Air will be delivered into your colon to keep it open. You may feel some pressure or cramping.  The camera will be used to take images during the procedure.  A small tissue sample may be removed from your body to be examined under a microscope (biopsy). If any potential problems are found, the tissue will be sent to a lab for testing.  If small polyps are found, your health care provider may remove them and have them checked for cancer cells.  The tube that was inserted into your anus will be slowly removed. The procedure may vary among health care providers and hospitals. What happens after the procedure?  Your blood pressure, heart rate, breathing rate, and blood oxygen level will be monitored until the medicines you were given have worn off.  Do not drive for 24 hours after the exam.  You may have a small amount of blood in your stool.  You may pass gas and have mild abdominal cramping or bloating due to the air that was used to inflate your colon during the exam.  It is up to you to get the  results of your procedure. Ask your health care provider, or the department performing the procedure, when your results will be ready. This information is not intended to replace advice given to you by your health care provider. Make sure you discuss any questions you have with your health care provider. Document Released: 08/17/2000 Document Revised: 06/20/2016 Document Reviewed: 11/01/2015 Elsevier Interactive Patient Education  2018 [employer].  Colonoscopy, Adult, Care After This sheet gives you information about how to care for yourself after your procedure. Your health care provider may also give you more specific instructions. If you have problems or questions, contact your health care provider. What can I expect after the procedure? After the procedure, it is common to have:  A small amount of blood in your stool for 24 hours after the procedure.  Some  gas.  Mild abdominal cramping or bloating.  Follow these instructions at home: General instructions   For the first 24 hours after the procedure: ? Do not drive or use machinery. ? Do not sign important documents. ? Do not drink alcohol. ? Do your regular daily activities at a slower pace than normal. ? Eat soft, easy-to-digest foods. ? Rest often.  Take over-the-counter or prescription medicines only as told by your health care provider.  It is up to you to get the results of your procedure. Ask your health care provider, or the department performing the procedure, when your results will be ready. Relieving cramping and bloating  Try walking around when you have cramps or feel bloated.  Apply heat to your abdomen as told by your health care provider. Use a heat source that your health care provider recommends, such as a moist heat pack or a heating pad. ? Place a towel between your skin and the heat source. ? Leave the heat on for 20-30 minutes. ? Remove the heat if your skin turns bright red. This is especially  important if you are unable to feel pain, heat, or cold. You may have a greater risk of getting burned. Eating and drinking  Drink enough fluid to keep your urine clear or pale yellow.  Resume your normal diet as instructed by your health care provider. Avoid heavy or fried foods that are hard to digest.  Avoid drinking alcohol for as long as instructed by your health care provider. Contact a health care provider if:  You have blood in your stool 2-3 days after the procedure. Get help right away if:  You have more than a small spotting of blood in your stool.  You pass large blood clots in your stool.  Your abdomen is swollen.  You have nausea or vomiting.  You have a fever.  You have increasing abdominal pain that is not relieved with medicine. This information is not intended to replace advice given to you by your health care provider. Make sure you discuss any questions you have with your health care provider. Document Released: 04/03/2004 Document Revised: 05/14/2016 Document Reviewed: 11/01/2015 Elsevier Interactive Patient Education  2018 [place] Anesthesia is a term that refers to techniques, procedures, and medicines that help a person stay safe and comfortable during a medical procedure. Monitored anesthesia care, or sedation, is one type of anesthesia. Your anesthesia specialist may recommend sedation if you will be having a procedure that does not require you to be unconscious, such as:  Cataract surgery.  A dental procedure.  A biopsy.  A colonoscopy.  During the procedure, you may receive a medicine to help you relax (sedative). There are three levels of sedation:  Mild sedation. At this level, you may feel awake and relaxed. You will be able to follow directions.  Moderate sedation. At this level, you will be sleepy. You may not remember the procedure.  Deep sedation. At this level, you will be asleep. You will not remember  the procedure.  The more medicine you are given, the deeper your level of sedation will be. Depending on how you respond to the procedure, the anesthesia specialist may change your level of sedation or the type of anesthesia to fit your needs. An anesthesia specialist will monitor you closely during the procedure. Let your health care provider know about:  Any allergies you have.  All medicines you are taking, including vitamins, herbs, eye drops, creams, and over-the-counter  medicines.  Any use of steroids (by mouth or as a cream).  Any problems you or family members have had with sedatives and anesthetic medicines.  Any blood disorders you have.  Any surgeries you have had.  Any medical conditions you have, such as sleep apnea.  Whether you are pregnant or may be pregnant.  Any use of cigarettes, alcohol, or street drugs. What are the risks? Generally, this is a safe procedure. However, problems may occur, including:  Getting too much medicine (oversedation).  Nausea.  Allergic reaction to medicines.  Trouble breathing. If this happens, a breathing tube may be used to help with breathing. It will be removed when you are awake and breathing on your own.  Heart trouble.  Lung trouble.  Before the procedure Staying hydrated Follow instructions from your health care provider about hydration, which may include:  Up to 2 hours before the procedure - you may continue to drink clear liquids, such as water, clear fruit juice, black coffee, and plain tea.  Eating and drinking restrictions Follow instructions from your health care provider about eating and drinking, which may include:  8 hours before the procedure - stop eating heavy meals or foods such as meat, fried foods, or fatty foods.  6 hours before the procedure - stop eating light meals or foods, such as toast or cereal.  6 hours before the procedure - stop drinking milk or drinks that contain milk.  2 hours before  the procedure - stop drinking clear liquids.  Medicines Ask your health care provider about:  Changing or stopping your regular medicines. This is especially important if you are taking diabetes medicines or blood thinners.  Taking medicines such as aspirin and ibuprofen. These medicines can thin your blood. Do not take these medicines before your procedure if your health care provider instructs you not to.  Tests and exams  You will have a physical exam.  You may have blood tests done to show: ? How well your kidneys and liver are working. ? How well your blood can clot.  General instructions  Plan to have someone take you home from the hospital or clinic.  If you will be going home right after the procedure, plan to have someone with you for 24 hours.  What happens during the procedure?  Your blood pressure, heart rate, breathing, level of pain and overall condition will be monitored.  An IV tube will be inserted into one of your veins.  Your anesthesia specialist will give you medicines as needed to keep you comfortable during the procedure. This may mean changing the level of sedation.  The procedure will be performed. After the procedure  Your blood pressure, heart rate, breathing rate, and blood oxygen level will be monitored until the medicines you were given have worn off.  Do not drive for 24 hours if you received a sedative.  You may: ? Feel sleepy, clumsy, or nauseous. ? Feel forgetful about what happened after the procedure. ? Have a sore throat if you had a breathing tube during the procedure. ? Vomit. This information is not intended to replace advice given to you by your health care provider. Make sure you discuss any questions you have with your health care provider. Document Released: 05/16/2005 Document Revised: 01/27/2016 Document Reviewed: 12/11/2015 Elsevier Interactive Patient Education  2018 [place], Care  After These instructions provide you with information about caring for yourself after your procedure. Your health care provider may also give  you more specific instructions. Your treatment has been planned according to current medical practices, but problems sometimes occur. Call your health care provider if you have any problems or questions after your procedure. What can I expect after the procedure? After your procedure, it is common to:  Feel sleepy for several hours.  Feel clumsy and have poor balance for several hours.  Feel forgetful about what happened after the procedure.  Have poor judgment for several hours.  Feel nauseous or vomit.  Have a sore throat if you had a breathing tube during the procedure.  Follow these instructions at home: For at least 24 hours after the procedure:   Do not: ? Participate in activities in which you could fall or become injured. ? Drive. ? Use heavy machinery. ? Drink alcohol. ? Take sleeping pills or medicines that cause drowsiness. ? Make important decisions or sign legal documents. ? Take care of children on your own.  Rest. Eating and drinking  Follow the diet that is recommended by your health care provider.  If you vomit, drink water, juice, or soup when you can drink without vomiting.  Make sure you have little or no nausea before eating solid foods. General instructions  Have a responsible adult stay with you until you are awake and alert.  Take over-the-counter and prescription medicines only as told by your health care provider.  If you smoke, do not smoke without supervision.  Keep all follow-up visits as told by your health care provider. This is important. Contact a health care provider if:  You keep feeling nauseous or you keep vomiting.  You feel light-headed.  You develop a rash.  You have a fever. Get help right away if:  You have trouble breathing. This information is not intended to replace advice  given to you by your health care provider. Make sure you discuss any questions you have with your health care provider. Document Released: 12/11/2015 Document Revised: 04/11/2016 Document Reviewed: 12/11/2015 Elsevier Interactive Patient Education  [employer].

## 2017-09-04 ENCOUNTER — Encounter (HOSPITAL_COMMUNITY): Payer: Self-pay

## 2017-09-04 ENCOUNTER — Other Ambulatory Visit: Payer: Self-pay

## 2017-09-04 ENCOUNTER — Encounter (HOSPITAL_COMMUNITY)
Admission: RE | Admit: 2017-09-04 | Discharge: 2017-09-04 | Disposition: A | Discharge status: Home / Self Care | Payer: Medicare HMO | Source: Ambulatory Visit | Attending: Internal Medicine | Admitting: Internal Medicine

## 2017-09-04 DIAGNOSIS — K629 Disease of anus and rectum, unspecified: Secondary | ICD-10-CM | POA: Insufficient documentation

## 2017-09-04 DIAGNOSIS — Z01818 Encounter for other preprocedural examination: Secondary | ICD-10-CM | POA: Diagnosis not present

## 2017-09-04 LAB — CBC WITH DIFFERENTIAL/PLATELET
Basophils Absolute: 0.1 10*3/uL (ref 0.0–0.1)
Basophils Relative: 1 %
Eosinophils Absolute: 0.3 10*3/uL (ref 0.0–0.7)
Eosinophils Relative: 7 %
HCT: 42 % (ref 39.0–52.0)
Hemoglobin: 14 g/dL (ref 13.0–17.0)
Lymphocytes Relative: 25 %
Lymphs Abs: 1.1 10*3/uL (ref 0.7–4.0)
MCH: 31 pg (ref 26.0–34.0)
MCHC: 33.3 g/dL (ref 30.0–36.0)
MCV: 92.9 fL (ref 78.0–100.0)
Monocytes Absolute: 0.4 10*3/uL (ref 0.1–1.0)
Monocytes Relative: 9 %
Neutro Abs: 2.5 10*3/uL (ref 1.7–7.7)
Neutrophils Relative %: 58 %
Platelets: 179 10*3/uL (ref 150–400)
RBC: 4.52 MIL/uL (ref 4.22–5.81)
RDW: 12.4 % (ref 11.5–15.5)
WBC: 4.3 10*3/uL (ref 4.0–10.5)

## 2017-09-04 LAB — BASIC METABOLIC PANEL
Anion gap: 10 (ref 5–15)
BUN: 25 mg/dL — ABNORMAL HIGH (ref 6–20)
CO2: 21 mmol/L — ABNORMAL LOW (ref 22–32)
Calcium: 9.1 mg/dL (ref 8.9–10.3)
Chloride: 105 mmol/L (ref 101–111)
Creatinine, Ser: 0.99 mg/dL (ref 0.61–1.24)
GFR calc Af Amer: >60 mL/min (ref >60)
GFR calc non Af Amer: >60 mL/min (ref >60)
Glucose, Bld: 106 mg/dL — ABNORMAL HIGH (ref 65–99)
Potassium: 4.1 mmol/L (ref 3.5–5.1)
Sodium: 136 mmol/L (ref 135–145)

## 2017-09-07 NOTE — Progress Notes (Signed)
Mildly elevated glucose likely nonfasting. Stable bun. Save for rmr.

## 2017-09-09 ENCOUNTER — Encounter (HOSPITAL_COMMUNITY): Payer: Self-pay | Admitting: Anesthesiology

## 2017-09-09 ENCOUNTER — Ambulatory Visit (HOSPITAL_COMMUNITY): Payer: Medicare HMO | Admitting: Anesthesiology

## 2017-09-09 ENCOUNTER — Ambulatory Visit (HOSPITAL_COMMUNITY)
Admission: RE | Admit: 2017-09-09 | Discharge: 2017-09-09 | Disposition: A | Discharge status: Home / Self Care | Payer: Medicare HMO | Source: Ambulatory Visit | Attending: Internal Medicine | Admitting: Internal Medicine

## 2017-09-09 ENCOUNTER — Encounter (HOSPITAL_COMMUNITY)
Admission: RE | Disposition: A | Discharge status: Home / Self Care | Payer: Self-pay | Source: Ambulatory Visit | Attending: Internal Medicine

## 2017-09-09 DIAGNOSIS — Z87891 Personal history of nicotine dependence: Secondary | ICD-10-CM | POA: Diagnosis not present

## 2017-09-09 DIAGNOSIS — Z7982 Long term (current) use of aspirin: Secondary | ICD-10-CM | POA: Insufficient documentation

## 2017-09-09 DIAGNOSIS — K921 Melena: Secondary | ICD-10-CM | POA: Diagnosis present

## 2017-09-09 DIAGNOSIS — M542 Cervicalgia: Secondary | ICD-10-CM | POA: Diagnosis not present

## 2017-09-09 DIAGNOSIS — R351 Nocturia: Secondary | ICD-10-CM | POA: Insufficient documentation

## 2017-09-09 DIAGNOSIS — R251 Tremor, unspecified: Secondary | ICD-10-CM | POA: Diagnosis not present

## 2017-09-09 DIAGNOSIS — M1990 Unspecified osteoarthritis, unspecified site: Secondary | ICD-10-CM | POA: Diagnosis not present

## 2017-09-09 DIAGNOSIS — Z981 Arthrodesis status: Secondary | ICD-10-CM | POA: Insufficient documentation

## 2017-09-09 DIAGNOSIS — I2510 Atherosclerotic heart disease of native coronary artery without angina pectoris: Secondary | ICD-10-CM | POA: Insufficient documentation

## 2017-09-09 DIAGNOSIS — Z808 Family history of malignant neoplasm of other organs or systems: Secondary | ICD-10-CM | POA: Insufficient documentation

## 2017-09-09 DIAGNOSIS — Z886 Allergy status to analgesic agent status: Secondary | ICD-10-CM | POA: Insufficient documentation

## 2017-09-09 DIAGNOSIS — Z800 Family history of malignant neoplasm of digestive organs: Secondary | ICD-10-CM | POA: Diagnosis not present

## 2017-09-09 DIAGNOSIS — K640 First degree hemorrhoids: Secondary | ICD-10-CM | POA: Diagnosis not present

## 2017-09-09 DIAGNOSIS — Z79899 Other long term (current) drug therapy: Secondary | ICD-10-CM | POA: Diagnosis not present

## 2017-09-09 DIAGNOSIS — K219 Gastro-esophageal reflux disease without esophagitis: Secondary | ICD-10-CM | POA: Insufficient documentation

## 2017-09-09 DIAGNOSIS — Z8619 Personal history of other infectious and parasitic diseases: Secondary | ICD-10-CM | POA: Insufficient documentation

## 2017-09-09 DIAGNOSIS — E785 Hyperlipidemia, unspecified: Secondary | ICD-10-CM | POA: Insufficient documentation

## 2017-09-09 HISTORY — PX: COLONOSCOPY WITH PROPOFOL: SHX5780

## 2017-09-09 SURGERY — COLONOSCOPY WITH PROPOFOL
Anesthesia: Monitor Anesthesia Care

## 2017-09-09 MED ORDER — MIDAZOLAM HCL 2 MG/2ML IJ SOLN
1.0000 mg | INTRAMUSCULAR | Status: AC
  Administered 2017-09-09: 2 mg via INTRAVENOUS

## 2017-09-09 MED ORDER — FENTANYL CITRATE (PF) 100 MCG/2ML IJ SOLN
25.0000 ug | Freq: Once | INTRAMUSCULAR | Status: AC
  Administered 2017-09-09: 25 ug via INTRAVENOUS

## 2017-09-09 MED ORDER — MIDAZOLAM HCL 5 MG/5ML IJ SOLN
INTRAMUSCULAR | Status: DC | PRN
  Administered 2017-09-09: 2 mg via INTRAVENOUS

## 2017-09-09 MED ORDER — FENTANYL CITRATE (PF) 100 MCG/2ML IJ SOLN
INTRAMUSCULAR | Status: AC
  Filled 2017-09-09: qty 2

## 2017-09-09 MED ORDER — PROPOFOL 500 MG/50ML IV EMUL
INTRAVENOUS | Status: DC | PRN
  Administered 2017-09-09: 125 ug/kg/min via INTRAVENOUS

## 2017-09-09 MED ORDER — PROPOFOL 10 MG/ML IV BOLUS
INTRAVENOUS | Status: AC
  Filled 2017-09-09: qty 40

## 2017-09-09 MED ORDER — MIDAZOLAM HCL 2 MG/2ML IJ SOLN
INTRAMUSCULAR | Status: AC
  Filled 2017-09-09: qty 2

## 2017-09-09 MED ORDER — GLYCOPYRROLATE 0.2 MG/ML IJ SOLN
INTRAMUSCULAR | Status: AC
  Filled 2017-09-09: qty 1

## 2017-09-09 MED ORDER — LACTATED RINGERS IV SOLN
INTRAVENOUS | Status: DC
  Administered 2017-09-09: 08:00:00 via INTRAVENOUS

## 2017-09-09 MED ORDER — GLYCOPYRROLATE 0.2 MG/ML IJ SOLN
INTRAMUSCULAR | Status: DC | PRN
  Administered 2017-09-09: 0.2 mg via INTRAVENOUS

## 2017-09-09 NOTE — Op Note (Signed)
[facility] Patient Name: Jeffery FendtJoseph Goodwin Procedure Date: 09/09/2017 8:03 AM MRN: [id number] Date of Birth: 07/25/1951 Attending MD: Gennette Pacobert Michael Jencarlo Bonadonna , MD CSN: [id number] Age: [age] Admit Type: Outpatient Procedure:                Colonoscopy Indications:              Hematochezia Providers:                Gennette Pacobert Michael Jovani Colquhoun, MD, Nena PolioLisa Moore, RN, Dyann Ruddleonya                            Wilson Referring MD:              Medicines:                Propofol per Anesthesia Complications:            No immediate complications. Estimated Blood Loss:     Estimated blood loss: none. Procedure:                Pre-Anesthesia Assessment:                           - Prior to the procedure, a History and Physical                            was performed, and patient medications and                            allergies were reviewed. The patient's tolerance of                            previous anesthesia was also reviewed. The risks                            and benefits of the procedure and the sedation                            options and risks were discussed with the patient.                            All questions were answered, and informed consent                            was obtained. Prior Anticoagulants: The patient has                            taken no previous anticoagulant or antiplatelet                            agents. ASA Grade Assessment: II - A patient with                            mild systemic disease. After reviewing the risks                            and benefits,  the patient was deemed in                            satisfactory condition to undergo the procedure.                           After obtaining informed consent, the colonoscope                            was passed under direct vision. Throughout the                            procedure, the patient's blood pressure, pulse, and                            oxygen saturations were monitored continuously. The                             EC-[id number] ([id number]) scope was introduced through                            the and advanced to the the cecum, identified by                            appendiceal orifice and ileocecal valve. The                            colonoscopy was performed without difficulty. The                            patient tolerated the procedure well. The ileocecal                            valve, appendiceal orifice, and rectum were                            photographed. The entire colon was well visualized.                            The quality of the bowel preparation was adequate. Scope In: 8:33:06 AM Scope Out: 8:46:16 AM Scope Withdrawal Time: 0 hours 7 minutes 24 seconds  Total Procedure Duration: 0 hours 13 minutes 10 seconds  Findings:      The perianal and digital rectal examinations were normal.      Internal hemorrhoids were found during retroflexion. The hemorrhoids       were Grade I (internal hemorrhoids that do not prolapse).      The exam was otherwise without abnormality on direct and retroflexion       views. Impression:               - Internal hemorrhoids.                           - The examination was otherwise normal on direct  and retroflexion views.                           - No specimens collected. hematochezia likely due                            to benign anorectal bleeding. Moderate Sedation:      Moderate (conscious) sedation was personally administered by an       anesthesia professional. The following parameters were monitored: oxygen       saturation, heart rate, blood pressure, respiratory rate, EKG, adequacy       of pulmonary ventilation, and response to care. Total physician       intraservice time was 16 minutes. Recommendation:           - Patient has a contact number available for                            emergencies. The signs and symptoms of potential                            delayed  complications were discussed with the                            patient. Return to normal activities tomorrow.                            Written discharge instructions were provided to the                            patient.                           - Resume previous diet.                           - Patient has a contact number available for                            emergencies. The signs and symptoms of potential                            delayed complications were discussed with the                            patient. Return to normal activities tomorrow.                            Written discharge instructions were provided to the                            patient.                           - Advance diet as tolerated.                           -  Continue present medications.                           - Repeat colonoscopy in 7 years for screening                            purposes. Minimize straining. Begin Benefiber as                            directed. If bleeding recurs patient is to let me                            know.                           - Return to GI office PRN. Procedure Code(s):        --- Professional ---                           [id number], Colonoscopy, flexible; diagnostic, including                            collection of specimen(s) by brushing or washing,                            when performed (separate procedure) Diagnosis Code(s):        --- Professional ---                           K64.0, First degree hemorrhoids                           K92.1, Melena (includes Hematochezia) CPT copyright 2016 American [employer] Association. All rights reserved. The codes documented in this report are preliminary and upon coder review may  be revised to meet current compliance requirements. Gerrit Friends. Leylah Tarnow, MD Gennette Pac, MD 09/09/2017 8:57:57 AM This report has been signed electronically. Number of Addenda: 0

## 2017-09-09 NOTE — H&P (Signed)
@LOGO @   Primary Care Physician:  Kari Baars, MD Primary Gastroenterologist:  Dr. Jena Gauss  Pre-Procedure History & Physical: HPI:  Jeffery Goodwin is a 67 y.o. male here for further evaluation of intermittent rectal bleeding via colonoscopy. Patient states he cut his baby aspirin down to 40 mg daily. Over the past several weeks bleeding has ceased.  Mother with colon cancer but at advanced age.  Past Medical History:  Diagnosis Date  . Arthritis   . Carpal tunnel syndrome   . Coronary atherosclerosis of native coronary artery    DES mid LCx/OM2 12/2013  . Dry eyes   . GERD (gastroesophageal reflux disease)   . History of shingles   . Hyperlipidemia   . Neck pain   . Nocturia   . Tremor    Right hand    Past Surgical History:  Procedure Laterality Date  . ANTERIOR CERVICAL DECOMP/DISCECTOMY FUSION N/A 12/20/2014   Procedure: ANTERIOR CERVICAL DECOMPRESSION/DISCECTOMY FUSION 3 LEVELS;  Surgeon: [name], MD;  Location: [facility];  Service: Neurosurgery;  Laterality: N/A;  ANTERIOR CERVICAL DECOMPRESSION/DISCECTOMY FUSION 3 LEVELS C3-4,4-5,5-6  . Arthroscopic left knee surgery  2005/2013  . Carpal tunnel repair Right 1998  . COLONOSCOPY     x2  . INTRAMEDULLARY (IM) NAIL INTERTROCHANTERIC Left 09/13/2015   Procedure: INTRAMEDULLARY  NAIL INTERTROCHANTRIC;  Surgeon: [name], MD;  Location: [facility];  Service: Orthopedics;  Laterality: Left;  . LEFT HEART CATHETERIZATION WITH CORONARY ANGIOGRAM N/A 12/11/2013   Procedure: LEFT HEART CATHETERIZATION WITH CORONARY ANGIOGRAM;  Surgeon: [name], MD;  Location: [facility] CATH LAB;  Service: Cardiovascular;  Laterality: N/A;  . Left inguinal herniorrhaphy  2008    Prior to Admission medications   Medication Sig Start Date End Date Taking? Authorizing Provider  aspirin 81 MG chewable tablet Chew daily by mouth. Takes 1/2 tablet daily   Yes [provider]  cholecalciferol (VITAMIN D) 1000 units tablet Take 2,000  Units daily by mouth.    Yes [provider]  Cyanocobalamin (B-12) 3000 MCG CAPS Take 1 capsule by mouth daily.   Yes [provider]  KRILL OIL PO Take 750 mg by mouth daily.   Yes [provider]  Misc Natural Products (GLUCOS-CHONDROIT-MSM COMPLEX PO) Take 3 tablets by mouth every morning.    Yes [provider]  Probiotic Product (PROBIOTIC-10 ULTIMATE PO) Take by mouth.   Yes [provider]  propranolol (INDERAL) 10 MG tablet Take 5 mg by mouth 2 (two) times daily.    Yes [provider]  Saw Palmetto 450 MG CAPS Take 450 mg by mouth daily.   Yes [provider]  Tetrahydrozoline HCl (VISINE OP) Place 1 drop into both eyes 2 (two) times daily.   Yes [provider]  vitamin C (ASCORBIC ACID) 500 MG tablet Take 500 mg by mouth daily.   Yes [provider]    Allergies as of 07/09/2017 - Review Complete 07/09/2017  Allergen Reaction Noted  . Asa [aspirin] Other (See Comments) 09/13/2014    Family History  Problem Relation Age of Onset  . Colon cancer Mother   . Brain cancer Father     Social History   Socioeconomic History  . Marital status: Married    Spouse name: Not on file  . Number of children: 1  . Years of education: Not on file  . Highest education level: Not on file  Social Needs  . Financial resource strain: Not on file  .  Food insecurity - worry: Not on file  . Food insecurity - inability: Not on file  . Transportation needs - medical: Not on file  . Transportation needs - non-medical: Not on file  Occupational History  . Not on file  Tobacco Use  . Smoking status: Former Smoker    Packs/day: 2.00    Years: 37.00    Pack years: 74.00    Types: Cigarettes    Start date: 09/03/1972    Last attempt to quit: 09/03/2008    Years since quitting: 9.0  . Smokeless tobacco: Never Used  . Tobacco comment: quit smoking 6 yrs ago.   Substance and Sexual Activity  . Alcohol use: Yes     Alcohol/week: 1.2 oz    Types: 2 Cans of beer per week    Comment: 2 beers a day  . Drug use: No  . Sexual activity: Yes    Birth control/protection: None  Other Topics Concern  . Not on file  Social History Narrative   Self-employed as a Psychologist, occupationalwelder. Remains active including swimming and biking          Review of Systems: See HPI, otherwise negative ROS  Physical Exam: BP 124/73   Pulse (!) 46   Temp 98.3 F (36.8 C) (Oral)  General:   Alert,  Well-developed, well-nourished, pleasant and cooperative in NAD Neck:  Supple; no masses or thyromegaly. No significant cervical adenopathy. Lungs:  Clear throughout to auscultation.   No wheezes, crackles, or rhonchi. No acute distress. Heart:  Regular rate and rhythm; no murmurs, clicks, rubs,  or gallops. Abdomen: Non-distended, normal bowel sounds.  Soft and nontender without appreciable mass or hepatosplenomegaly.  Pulses:  Normal pulses noted. Extremities:  Without clubbing or edema.  Impression:  Pleasant 67 year old gentleman with intermittent painless hematochezia. Family history of colon cancer but at advanced age. Bleeding has tapered off since he cut back to a minimal dose of ASA.  Recommendations:  I have offered the patient a diagnostic colonoscopy today.  The risks, benefits, limitations, alternatives and imponderables have been reviewed with the patient. Questions have been answered. All parties are agreeable.    Notice: This dictation was prepared with Dragon dictation along with smaller phrase technology. Any transcriptional errors that result from this process are unintentional and may not be corrected upon review.

## 2017-09-09 NOTE — Anesthesia Postprocedure Evaluation (Signed)
Anesthesia Post Note  Patient: Jeffery Goodwin  Procedure(s) Performed: COLONOSCOPY WITH PROPOFOL (N/A )  Patient location during evaluation: PACU Anesthesia Type: MAC Level of consciousness: awake and patient cooperative Pain management: pain level controlled Vital Signs Assessment: post-procedure vital signs reviewed and stable Respiratory status: spontaneous breathing, nonlabored ventilation and respiratory function stable Cardiovascular status: blood pressure returned to baseline Postop Assessment: no apparent nausea or vomiting Anesthetic complications: no     Last Vitals:  Vitals:   09/09/17 0744 09/09/17 0745  BP:  124/73  Pulse:  (!) 46  Temp: 36.8 C     Last Pain:  Vitals:   09/09/17 0744  TempSrc: Oral                 [name] J

## 2017-09-09 NOTE — Transfer of Care (Signed)
Immediate Anesthesia Transfer of Care Note  Patient: DAYSON ABOUD  Procedure(s) Performed: COLONOSCOPY WITH PROPOFOL (N/A )  Patient Location: PACU  Anesthesia Type:MAC  Level of Consciousness: awake and patient cooperative  Airway & Oxygen Therapy: Patient Spontanous Breathing and Patient connected to nasal cannula oxygen  Post-op Assessment: Report given to RN and Post -op Vital signs reviewed and stable  Post vital signs: Reviewed and stable  Last Vitals:  Vitals:   09/09/17 0744 09/09/17 0745  BP:  124/73  Pulse:  (!) 46  Temp: 36.8 C     Last Pain:  Vitals:   09/09/17 0744  TempSrc: Oral      Patients Stated Pain Goal: 5 (74/12/87 8676)  Complications: No apparent anesthesia complications

## 2017-09-09 NOTE — Discharge Instructions (Signed)
Colonoscopy Discharge Instructions  Read the instructions outlined below and refer to this sheet in the next few weeks. These discharge instructions provide you with general information on caring for yourself after you leave the hospital. Your doctor may also give you specific instructions. While your treatment has been planned according to the most current medical practices available, unavoidable complications occasionally occur. If you have any problems or questions after discharge, call Dr. Gala Romney at [phone]. ACTIVITY  You may resume your regular activity, but move at a slower pace for the next 24 hours.   Take frequent rest periods for the next 24 hours.   Walking will help get rid of the air and reduce the bloated feeling in your belly (abdomen).   No driving for 24 hours (because of the medicine (anesthesia) used during the test).    Do not sign any important legal documents or operate any machinery for 24 hours (because of the anesthesia used during the test).  NUTRITION  Drink plenty of fluids.   You may resume your normal diet as instructed by your doctor.   Begin with a light meal and progress to your normal diet. Heavy or fried foods are harder to digest and may make you feel sick to your stomach (nauseated).   Avoid alcoholic beverages for 24 hours or as instructed.  MEDICATIONS  You may resume your normal medications unless your doctor tells you otherwise.  WHAT YOU CAN EXPECT TODAY  Some feelings of bloating in the abdomen.   Passage of more gas than usual.   Spotting of blood in your stool or on the toilet paper.  IF YOU HAD POLYPS REMOVED DURING THE COLONOSCOPY:  No aspirin products for 7 days or as instructed.   No alcohol for 7 days or as instructed.   Eat a soft diet for the next 24 hours.  FINDING OUT THE RESULTS OF YOUR TEST Not all test results are available during your visit. If your test results are not back during the visit, make an appointment  with your caregiver to find out the results. Do not assume everything is normal if you have not heard from your caregiver or the medical facility. It is important for you to follow up on all of your test results.  SEEK IMMEDIATE MEDICAL ATTENTION IF:  You have more than a spotting of blood in your stool.   Your belly is swollen (abdominal distention).   You are nauseated or vomiting.   You have a temperature over 101.   You have abdominal pain or discomfort that is severe or gets worse throughout the day.   Hemorrhoids Hemorrhoids are swollen veins in and around the rectum or anus. Hemorrhoids can cause pain, itching, or bleeding. Most of the time, they do not cause serious problems. They usually get better with diet changes, lifestyle changes, and other home treatments. Follow these instructions at home: Eating and drinking  Eat foods that have fiber, such as whole grains, beans, nuts, fruits, and vegetables. Ask your doctor about taking products that have added fiber (fibersupplements).  Drink enough fluid to keep your pee (urine) clear or pale yellow. For Pain and Swelling  Take a warm-water bath (sitz bath) for 20 minutes to ease pain. Do this 3-4 times a day.  If directed, put ice on the painful area. It may be helpful to use ice between your warm baths. ? Put ice in a plastic bag. ? Place a towel between your skin and the bag. ? Leave  the ice on for 20 minutes, 2-3 times a day. General instructions  Take over-the-counter and prescription medicines only as told by your doctor. ? Medicated creams and medicines that are inserted into the anus (suppositories) may be used or applied as told.  Exercise often.  Go to the bathroom when you have the urge to poop (to have a bowel movement). Do not wait.  Avoid pushing too hard (straining) when you poop.  Keep the butt area dry and clean. Use wet toilet paper or moist paper towels.  Do not sit on the toilet for a long  time. Contact a doctor if:  You have any of these: ? Pain and swelling that do not get better with treatment or medicine. ? Bleeding that will not stop. ? Trouble pooping or you cannot poop. ? Pain or swelling outside the area of the hemorrhoids. This information is not intended to replace advice given to you by your health care provider. Make sure you discuss any questions you have with your health care provider. Document Released: 05/29/2008 Document Revised: 01/26/2016 Document Reviewed: 05/04/2015 Elsevier Interactive Patient Education  2018 [employer]   Hemorrhoid information provided  Begin Benefiber 1 tablespoon daily for one month then increase to 1 tablespoon twice daily thereafter  One more screening colonoscopy in 7 years  Minimize straining as feasible  Let me know if bleeding recurs.

## 2017-09-09 NOTE — Anesthesia Preprocedure Evaluation (Signed)
Anesthesia Evaluation  Patient identified by MRN, date of birth, ID band Patient awake    Reviewed: Allergy & Precautions, NPO status , Patient's Chart, lab work & pertinent test results, reviewed documented beta blocker date and time   Airway Mallampati: I  TM Distance: >3 FB Neck ROM: Full    Dental  (+) Teeth Intact   Pulmonary former smoker,    breath sounds clear to auscultation       Cardiovascular (-) angina+ CAD and + Cardiac Stents   Rhythm:Regular Rate:Normal     Neuro/Psych tremors  Neuromuscular disease negative psych ROS   GI/Hepatic GERD  Medicated,  Endo/Other    Renal/GU      Musculoskeletal   Abdominal   Peds  Hematology   Anesthesia Other Findings   Reproductive/Obstetrics                             Anesthesia Physical Anesthesia Plan  ASA: III  Anesthesia Plan: MAC   Post-op Pain Management:    Induction: Intravenous  PONV Risk Score and Plan:   Airway Management Planned: Simple Face Mask  Additional Equipment:   Intra-op Plan:   Post-operative Plan:   Informed Consent: I have reviewed the patients History and Physical, chart, labs and discussed the procedure including the risks, benefits and alternatives for the proposed anesthesia with the patient or authorized representative who has indicated his/her understanding and acceptance.     Plan Discussed with:   Anesthesia Plan Comments:         Anesthesia Quick Evaluation

## 2017-09-11 ENCOUNTER — Encounter (HOSPITAL_COMMUNITY): Payer: Self-pay | Admitting: Internal Medicine

## 2017-09-18 NOTE — Progress Notes (Signed)
Cardiology Office Note  Date: 09/20/2017   ID: Mcihael, Goodwin Apr 10, 1951, MRN [id number]  PCP: Kari Baars, MD  Primary Cardiologist: Nona Dell, MD   Chief Complaint  Patient presents with  . Coronary Artery Disease    History of Present Illness: Jeffery Goodwin is a 67 y.o. male last seen in July 2018. He presents today for a routine follow-up visit. Overall, states that he feels quite well, less arthritic pains, still exercising regularly. He has stopped swimming but is now doing regular spinning classes and weight training. He does not report any angina or unusual shortness of breath.  He started eating more eggs and also increased his omega-3 supplements. Although his HDL has improved, total cholesterol and LDL have not been at goal. He has decided to consider eating a vegan diet and try to lose more weight. Still wants to avoid statins given prior intolerances. Not ready to consider use of PCSK9 inhibitor.  Today we talked about surveillance stress testing, his DES intervention was in 2015. We have been on the fence about this issue since he is very active with exercise and has no symptoms. We have decided to continue with observation at this point.  Past Medical History:  Diagnosis Date  . Arthritis   . Carpal tunnel syndrome   . Coronary atherosclerosis of native coronary artery    DES mid LCx/OM2 12/2013  . Dry eyes   . GERD (gastroesophageal reflux disease)   . History of shingles   . Hyperlipidemia   . Neck pain   . Nocturia   . Tremor    Right hand    Past Surgical History:  Procedure Laterality Date  . ANTERIOR CERVICAL DECOMP/DISCECTOMY FUSION N/A 12/20/2014   Procedure: ANTERIOR CERVICAL DECOMPRESSION/DISCECTOMY FUSION 3 LEVELS;  Surgeon: [name], MD;  Location: [facility];  Service: Neurosurgery;  Laterality: N/A;  ANTERIOR CERVICAL DECOMPRESSION/DISCECTOMY FUSION 3 LEVELS C3-4,4-5,5-6  . Arthroscopic left knee surgery  2005/2013  . Carpal  tunnel repair Right 1998  . COLONOSCOPY     x2  . COLONOSCOPY WITH PROPOFOL N/A 09/09/2017   Procedure: COLONOSCOPY WITH PROPOFOL;  Surgeon: [name], MD;  Location: [facility];  Service: Endoscopy;  Laterality: N/A;  8:30am  . INTRAMEDULLARY (IM) NAIL INTERTROCHANTERIC Left 09/13/2015   Procedure: INTRAMEDULLARY  NAIL INTERTROCHANTRIC;  Surgeon: [name], MD;  Location: [facility];  Service: Orthopedics;  Laterality: Left;  . LEFT HEART CATHETERIZATION WITH CORONARY ANGIOGRAM N/A 12/11/2013   Procedure: LEFT HEART CATHETERIZATION WITH CORONARY ANGIOGRAM;  Surgeon: [name], MD;  Location: [facility] CATH LAB;  Service: Cardiovascular;  Laterality: N/A;  . Left inguinal herniorrhaphy  2008    Current Outpatient Medications  Medication Sig Dispense Refill  . aspirin 81 MG chewable tablet Chew daily by mouth. Takes 1/2 tablet daily    . cholecalciferol (VITAMIN D) 1000 units tablet Take 2,000 Units daily by mouth.     . Cyanocobalamin (B-12) 3000 MCG CAPS Take 1 capsule by mouth daily.    Marland Kitchen KRILL OIL PO Take 1,500 mg by mouth daily.     . Misc Natural Products (GLUCOS-CHONDROIT-MSM COMPLEX PO) Take 3 tablets by mouth every morning.     . Omega-3 Fatty Acids (FISH OIL) 1000 MG CPDR Take 3,000 mg by mouth daily.    . Probiotic Product (PROBIOTIC-10 ULTIMATE PO) Take by mouth.    . propranolol (INDERAL) 10 MG tablet Take 5 mg by mouth daily.     Marland Kitchen  Saw Palmetto 450 MG CAPS Take 450 mg by mouth daily.    . Tetrahydrozoline HCl (VISINE OP) Place 1 drop into both eyes 2 (two) times daily.    . vitamin C (ASCORBIC ACID) 500 MG tablet Take 500 mg by mouth daily.     No current facility-administered medications for this visit.    Allergies:  Asa [aspirin]   Social History: The patient  reports that he quit smoking about 9 years ago. His smoking use included cigarettes. He started smoking about 45 years ago. He has a 74.00 pack-year smoking history. he has never used smokeless tobacco. He  reports that he drinks about 1.2 oz of alcohol per week. He reports that he does not use drugs.   ROS:  Please see the history of present illness. Otherwise, complete review of systems is positive for improvement in arthritic symptoms.  All other systems are reviewed and negative.   Physical Exam: VS:  BP 128/80   Pulse 68   Ht 6\' 1"  (1.854 m)   Wt 214 lb (97.1 kg)   SpO2 98%   BMI 28.23 kg/m , BMI Body mass index is 28.23 kg/m.  Wt Readings from Last 3 Encounters:  09/20/17 214 lb (97.1 kg)  09/04/17 213 lb (96.6 kg)  07/09/17 212 lb 12.8 oz (96.5 kg)    General: Patient appears comfortable at rest. HEENT: Conjunctiva and lids normal, oropharynx clear. Neck: Supple, no elevated JVP or carotid bruits, no thyromegaly. Lungs: Clear to auscultation, nonlabored breathing at rest. Cardiac: Regular rate and rhythm, no S3 or significant systolic murmur, no pericardial rub. Abdomen: Soft, nontender, bowel sounds present. Extremities: No pitting edema, distal pulses 2+. Skin: Warm and dry. Musculoskeletal: No kyphosis. Neuropsychiatric: Alert and oriented x3, affect grossly appropriate.  ECG: I personally reviewed the tracing from 03/13/2017 which showed sinus bradycardia with left axis and LVH.  Recent Labwork: 09/04/2017: BUN 25; Creatinine, Ser 0.99; Hemoglobin 14.0; Platelets 179; Potassium 4.1; Sodium 136     Component Value Date/Time   CHOL 204 (H) 06/01/2015 0856   TRIG 59 06/01/2015 0856   HDL 72 06/01/2015 0856   CHOLHDL 2.8 06/01/2015 0856   VLDL 12 06/01/2015 0856   LDLCALC 120 06/01/2015 0856    Other Studies Reviewed Today:  Echocardiogram 12/28/2013: Study Conclusions  - Procedure narrative: Transthoracic echocardiography. Image quality was suboptimal. The study was technically difficult, as a result of poor sound wave transmission. - Left ventricle: The cavity size was mildly dilated. Systolic function was low normal to mildly reduced. The estimated  ejection fraction was in the range of 45% to 50%. Mild global hypokinesis. There was an increased relative contribution of atrial contraction to ventricular filling. Doppler parameters are consistent with abnormal left ventricular relaxation (grade 1 diastolic dysfunction). Mild basal septal hypertrophy. - Mitral valve: Mild regurgitation. - Left atrium: The atrium was moderately dilated.  Assessment and Plan:  1. CAD status post DES to the circumflex/OM in April 2015. He continues to do very well, no angina symptoms with good exercise capacity. As noted above we plan to continue with observation at this time.  2. Mixed hyperlipidemia with statin intolerance. He is changing to a vegan diet and will continue to exercise regularly. Reassess fasting lipids for next visit.  3. Arthritic symptoms, mainly involving the neck and knees. These have improved with increased use of omega-3 supplements and also exercise. He has had to stop swimming however.  Current medicines were reviewed with the patient today.   Orders  Placed This Encounter  Procedures  . Lipid Profile    Disposition: Follow-up in 6 months, sooner if needed.  Signed, Jonelle Sidle, MD, [facility] 09/20/2017 11:33 AM    [facility] at [facility] [address] [place], [place], [place] [address] Phone: [phone]; Fax: [phone]

## 2017-09-20 ENCOUNTER — Encounter: Payer: Self-pay | Admitting: Cardiology

## 2017-09-20 ENCOUNTER — Ambulatory Visit: Payer: Medicare HMO | Admitting: Cardiology

## 2017-09-20 VITALS — BP 128/80 | HR 68 | Ht 73.0 in | Wt 214.0 lb

## 2017-09-20 DIAGNOSIS — I2510 Atherosclerotic heart disease of native coronary artery without angina pectoris: Secondary | ICD-10-CM | POA: Diagnosis not present

## 2017-09-20 DIAGNOSIS — Z789 Other specified health status: Secondary | ICD-10-CM

## 2017-09-20 DIAGNOSIS — E782 Mixed hyperlipidemia: Secondary | ICD-10-CM

## 2017-09-20 NOTE — Patient Instructions (Signed)
Medication Instructions:  Your physician recommends that you continue on your current medications as directed. Please refer to the Current Medication list given to you today.  Labwork: LIPID PANEL Fasting To be done prior to next appointment Orders given today  Testing/Procedures: NONE  Follow-Up: Your physician wants you to follow-up in: 6 MONTHS WITH DR. MCDOWELL. You will receive a reminder letter in the mail two months in advance. If you don't receive a letter, please call our office to schedule the follow-up appointment.  Any Other Special Instructions Will Be Listed Below (If Applicable).  If you need a refill on your cardiac medications before your next appointment, please call your pharmacy.

## 2017-10-11 ENCOUNTER — Other Ambulatory Visit (HOSPITAL_COMMUNITY): Payer: Self-pay | Admitting: Pulmonary Disease

## 2017-10-11 DIAGNOSIS — M8580 Other specified disorders of bone density and structure, unspecified site: Secondary | ICD-10-CM

## 2017-12-23 ENCOUNTER — Ambulatory Visit (HOSPITAL_COMMUNITY)
Admission: RE | Admit: 2017-12-23 | Discharge: 2017-12-23 | Disposition: A | Discharge status: Home / Self Care | Payer: Medicare HMO | Source: Ambulatory Visit | Attending: Pulmonary Disease | Admitting: Pulmonary Disease

## 2017-12-23 DIAGNOSIS — M8580 Other specified disorders of bone density and structure, unspecified site: Secondary | ICD-10-CM

## 2017-12-23 DIAGNOSIS — M85851 Other specified disorders of bone density and structure, right thigh: Secondary | ICD-10-CM | POA: Diagnosis not present

## 2018-03-26 ENCOUNTER — Other Ambulatory Visit: Payer: Self-pay | Admitting: Cardiology

## 2018-03-26 LAB — LIPID PANEL
Cholesterol: 204 mg/dL — ABNORMAL HIGH (ref <200)
HDL: 75 mg/dL (ref >40)
LDL Cholesterol (Calc): 115 mg/dL (calc) — ABNORMAL HIGH
Non-HDL Cholesterol (Calc): 129 mg/dL (calc) (ref <130)
Total CHOL/HDL Ratio: 2.7 (calc) (ref <5.0)
Triglycerides: 53 mg/dL (ref <150)

## 2018-04-12 NOTE — Progress Notes (Signed)
Cardiology Office Note  Date: 04/14/2018   ID: Barton FannyJoseph W Blissett, DOB 04/06/1951, MRN [id number]  PCP: Kari BaarsHawkins, Edward, MD  Primary Cardiologist: Nona DellSamuel Berniece Abid, MD   Chief Complaint  Patient presents with  . Coronary Artery Disease    History of Present Illness: Erskine SquibbJoseph W Godsey is a 67 y.o. male last seen in January.  He presents today for a routine follow-up visit.  Reports no angina symptoms, NYHA class I-II dyspnea, no palpitations or syncope.  He continues to exercise regularly, has good exercise tolerance as before (enjoys spinning, weights, canoe paddling, also swimming).  Recent follow-up lab work is outlined below.  We went over his lipids, he remains focused on diet and exercise as primary treatment.  He has statin intolerance and in general has not wanted to pursue other medication treatment options.  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  Past Medical History:  Diagnosis Date  . Arthritis   . Carpal tunnel syndrome   . Coronary atherosclerosis of native coronary artery    DES mid LCx/OM2 12/2013  . Dry eyes   . GERD (gastroesophageal reflux disease)   . History of shingles   . Hyperlipidemia   . Neck pain   . Nocturia   . Tremor    Right hand    Past Surgical History:  Procedure Laterality Date  . ANTERIOR CERVICAL DECOMP/DISCECTOMY FUSION N/A 12/20/2014   Procedure: ANTERIOR CERVICAL DECOMPRESSION/DISCECTOMY FUSION 3 LEVELS;  Surgeon: Julio SicksHenry Pool, MD;  [facility];  Service: Neurosurgery;  Laterality: N/A;  ANTERIOR CERVICAL DECOMPRESSION/DISCECTOMY FUSION 3 LEVELS C3-4,4-5,5-6  . Arthroscopic left knee surgery  2005/2013  . Carpal tunnel repair Right 1998  . COLONOSCOPY     x2  . COLONOSCOPY WITH PROPOFOL N/A 09/09/2017   Procedure: COLONOSCOPY WITH PROPOFOL;  Surgeon: Corbin Adeourk, Robert M, MD;  [facility];  Service: Endoscopy;  Laterality: N/A;  8:30am  .  INTRAMEDULLARY (IM) NAIL INTERTROCHANTERIC Left 09/13/2015   Procedure: INTRAMEDULLARY  NAIL INTERTROCHANTRIC;  Surgeon: Vickki HearingStanley E Harrison, MD;  Location: [facility];  Service: Orthopedics;  Laterality: Left;  . LEFT HEART CATHETERIZATION WITH CORONARY ANGIOGRAM N/A 12/11/2013   Procedure: LEFT HEART CATHETERIZATION WITH CORONARY ANGIOGRAM;  Surgeon: Peter M SwazilandJordan, MD;  [facility];  Service: Cardiovascular;  Laterality: N/A;  . Left inguinal herniorrhaphy  2008    Current Outpatient Medications  Medication Sig Dispense Refill  . aspirin 81 MG chewable tablet Chew daily by mouth. Takes 1/2 tablet daily    . cholecalciferol (VITAMIN D) 1000 units tablet Take 2,000 Units daily by mouth.     . Cyanocobalamin (B-12) 3000 MCG CAPS Take 1 capsule by mouth daily.    Marland Kitchen. KRILL OIL PO Take 1,500 mg by mouth daily.     . Misc Natural Products (GLUCOS-CHONDROIT-MSM COMPLEX PO) Take 3 tablets by mouth every morning.     . Omega-3 Fatty Acids (FISH OIL) 1000 MG CPDR Take 3,000 mg by mouth daily.    . Probiotic Product (PROBIOTIC-10 ULTIMATE PO) Take by mouth.    . propranolol (INDERAL) 10 MG tablet Take 5 mg by mouth daily.     . Saw Palmetto 450 MG CAPS Take 450 mg by mouth daily.    . vitamin C (ASCORBIC ACID) 500 MG tablet Take 500 mg by mouth daily.    . Tetrahydrozoline HCl (VISINE OP) Place 1 drop into both eyes 2 (two) times daily.  No current facility-administered medications for this visit.    Allergies:  Asa [aspirin]   Social History: The patient  reports that he quit smoking about 9 years ago. His smoking use included cigarettes. He started smoking about 45 years ago. He has a 74.00 pack-year smoking history. He has never used smokeless tobacco. He reports that he drinks about 2.0 standard drinks of alcohol per week. He reports that he does not use drugs.   ROS:  Please see the history of present illness. Otherwise, complete review of systems is positive for none.  All other systems  are reviewed and negative.   Physical Exam: VS:  BP 132/82 (BP Location: Left Arm, Patient Position: Sitting)   Pulse (!) 51   Ht 6\' 1"  (1.854 m)   Wt 210 lb (95.3 kg)   BMI 27.71 kg/m , BMI Body mass index is 27.71 kg/m.  Wt Readings from Last 3 Encounters:  04/14/18 210 lb (95.3 kg)  09/20/17 214 lb (97.1 kg)  09/04/17 213 lb (96.6 kg)    General: Patient appears comfortable at rest. HEENT: Conjunctiva and lids normal, oropharynx clear. Neck: Supple, no elevated JVP or carotid bruits, no thyromegaly. Lungs: Clear to auscultation, nonlabored breathing at rest. Cardiac: Regular rate and rhythm, no S3 or significant systolic murmur, no pericardial rub. Abdomen: Soft, nontender, bowel sounds present. Extremities: No pitting edema, distal pulses 2+. Skin: Warm and dry. Musculoskeletal: No kyphosis. Neuropsychiatric: Alert and oriented x3, affect grossly appropriate.  ECG: I personally reviewed the tracing from 03/13/2017 which showed sinus bradycardia with left axis and LVH.  Recent Labwork: 09/04/2017: BUN 25; Creatinine, Ser 0.99; Hemoglobin 14.0; Platelets 179; Potassium 4.1; Sodium 136     Component Value Date/Time   CHOL 204 (H) 03/26/2018 0815   TRIG 53 03/26/2018 0815   HDL 75 03/26/2018 0815   CHOLHDL 2.7 03/26/2018 0815   VLDL 12 06/01/2015 0856   LDLCALC 115 (H) 03/26/2018 0815    Other Studies Reviewed Today:  Echocardiogram 12/28/2013: Study Conclusions  - Procedure narrative: Transthoracic echocardiography. Image quality was suboptimal. The study was technically difficult, as a result of poor sound wave transmission. - Left ventricle: The cavity size was mildly dilated. Systolic function was low normal to mildly reduced. The estimated ejection fraction was in the range of 45% to 50%. Mild global hypokinesis. There was an increased relative contribution of atrial contraction to ventricular filling. Doppler parameters are consistent with  abnormal left ventricular relaxation (grade 1 diastolic dysfunction). Mild basal septal hypertrophy. - Mitral valve: Mild regurgitation. - Left atrium: The atrium was moderately dilated.  Assessment and Plan:  1.  CAD with history of DES to the circumflex/OM in April 2015.  He is doing well in terms of stamina with a regular exercise plan, no angina symptoms.  Continue aspirin, low-dose beta-blocker.  We have discussed continuing observation in the absence of symptoms change, no clear indication for follow-up ischemic testing at this particular time.  I reviewed his ECG.  2.  Mixed hyperlipidemia with statin intolerance.  He is managing this by diet and exercise.  Recent LDL 115 with HDL 75.  We have discussed other treatment options.  Current medicines were reviewed with the patient today.   Orders Placed This Encounter  Procedures  . EKG 12-Lead     Disposition: Follow-up in 6 months.  Signed, Jonelle Sidle, MD, [facility] 04/14/2018 11:45 AM    [facility] at [facility] [address] [place], [place] [address]  Phone: [phone]; Fax: [phone]

## 2018-04-14 ENCOUNTER — Ambulatory Visit: Payer: Medicare HMO | Admitting: Cardiology

## 2018-04-14 ENCOUNTER — Encounter: Payer: Self-pay | Admitting: Cardiology

## 2018-04-14 VITALS — BP 132/82 | HR 51 | Ht 73.0 in | Wt 210.0 lb

## 2018-04-14 DIAGNOSIS — I25119 Atherosclerotic heart disease of native coronary artery with unspecified angina pectoris: Secondary | ICD-10-CM | POA: Diagnosis not present

## 2018-04-14 DIAGNOSIS — E782 Mixed hyperlipidemia: Secondary | ICD-10-CM

## 2018-04-14 NOTE — Patient Instructions (Addendum)

## 2018-09-12 ENCOUNTER — Other Ambulatory Visit: Payer: Self-pay | Admitting: Surgery

## 2018-10-14 NOTE — Progress Notes (Signed)
Cardiology Office Note  Date: 10/16/2018   ID: Barton FannyJoseph W Ferrin, DOB 03/05/1951, MRN [id number]  PCP: Kari BaarsHawkins, Edward, MD  Primary Cardiologist: Nona DellSamuel Mykela Mewborn, MD   Chief Complaint  Patient presents with  . Coronary Artery Disease    History of Present Illness: Jeffery Goodwin is a 68 y.o. male last seen in August 2019.  He is here for a routine office visit.  Reports no change from a cardiac perspective, specifically no angina symptoms, no worsening shortness of breath with typical activity.  He still exercises regularly, has gotten back to swimming after he was able to strengthen his shoulder joints with free weights.  Also does spinning.  Activities well exceed 4 METS.  He is planning elective right herniorrhaphy under general anesthesia later this month.  RCRI risk calculator is class II with 0.9% risk of major cardiac events.  I reviewed his medications, he continues on aspirin, propranolol, and omega-3 supplements.  We discussed trying Zetia, he has prior statin intolerance and was not interested in PCSK 9 inhibitor.  I personally reviewed his ECG today which shows sinus bradycardia with sinus arrhythmia, IVCD/left anterior fascicular block.  Past Medical History:  Diagnosis Date  . Arthritis   . Carpal tunnel syndrome   . Coronary atherosclerosis of native coronary artery    DES mid LCx/OM2 12/2013  . Dry eyes   . GERD (gastroesophageal reflux disease)   . History of shingles   . Hyperlipidemia   . Neck pain   . Nocturia   . Tremor    Right hand    Past Surgical History:  Procedure Laterality Date  . ANTERIOR CERVICAL DECOMP/DISCECTOMY FUSION N/A 12/20/2014   Procedure: ANTERIOR CERVICAL DECOMPRESSION/DISCECTOMY FUSION 3 LEVELS;  Surgeon: Julio SicksHenry Pool, MD;  [facility];  Service: Neurosurgery;  Laterality: N/A;  ANTERIOR CERVICAL DECOMPRESSION/DISCECTOMY FUSION 3 LEVELS C3-4,4-5,5-6  . Arthroscopic left knee surgery  2005/2013  . Carpal tunnel repair Right  1998  . COLONOSCOPY     x2  . COLONOSCOPY WITH PROPOFOL N/A 09/09/2017   Procedure: COLONOSCOPY WITH PROPOFOL;  Surgeon: Corbin Adeourk, Robert M, MD;  [facility];  Service: Endoscopy;  Laterality: N/A;  8:30am  . INTRAMEDULLARY (IM) NAIL INTERTROCHANTERIC Left 09/13/2015   Procedure: INTRAMEDULLARY  NAIL INTERTROCHANTRIC;  Surgeon: Vickki HearingStanley E Harrison, MD;  Location: [facility];  Service: Orthopedics;  Laterality: Left;  . LEFT HEART CATHETERIZATION WITH CORONARY ANGIOGRAM N/A 12/11/2013   Procedure: LEFT HEART CATHETERIZATION WITH CORONARY ANGIOGRAM;  Surgeon: Peter M SwazilandJordan, MD;  [facility];  Service: Cardiovascular;  Laterality: N/A;  . Left inguinal herniorrhaphy  2008    Current Outpatient Medications  Medication Sig Dispense Refill  . aspirin 81 MG chewable tablet Chew daily by mouth. Takes 1/2 tablet daily    . cholecalciferol (VITAMIN D) 1000 units tablet Take 2,000 Units daily by mouth.     . Cyanocobalamin (B-12) 3000 MCG CAPS Take 1 capsule by mouth daily.    . Misc Natural Products (GLUCOS-CHONDROIT-MSM COMPLEX PO) Take 3 tablets by mouth every morning.     . Omega-3 Fatty Acids (FISH OIL) 1000 MG CPDR Take 4,000 mg by mouth daily.     . Probiotic Product (PROBIOTIC-10 ULTIMATE PO) Take by mouth.    . propranolol (INDERAL) 10 MG tablet Take 5 mg by mouth daily.     . Saw Palmetto 450 MG CAPS Take 450 mg by mouth daily.    . Tetrahydrozoline HCl (VISINE OP) Place 1 drop  into both eyes 2 (two) times daily.    . vitamin C (ASCORBIC ACID) 500 MG tablet Take 500 mg by mouth daily.    Marland Kitchen ezetimibe (ZETIA) 10 MG tablet Take 1 tablet (10 mg total) by mouth daily. 90 tablet 3   No current facility-administered medications for this visit.    Allergies:  Asa [aspirin]    Social History: The patient  reports that he quit smoking about 10 years ago. His smoking use included cigarettes. He started smoking about 46 years ago. He has a 74.00 pack-year smoking history. He has never  used smokeless tobacco. He reports current alcohol use of about 2.0 standard drinks of alcohol per week. He reports that he does not use drugs.   ROS:  Please see the history of present illness. Otherwise, complete review of systems is positive for chronic arthritic pain.  All other systems are reviewed and negative.   Physical Exam: VS:  BP 124/74 (BP Location: Left Arm)   Pulse (!) 55   Ht 6\' 1"  (1.854 m)   Wt 215 lb (97.5 kg)   SpO2 98%   BMI 28.37 kg/m , BMI Body mass index is 28.37 kg/m.  Wt Readings from Last 3 Encounters:  10/16/18 215 lb (97.5 kg)  04/14/18 210 lb (95.3 kg)  09/20/17 214 lb (97.1 kg)    General: Patient appears comfortable at rest. HEENT: Conjunctiva and lids normal, oropharynx clear. Neck: Supple, no elevated JVP or carotid bruits, no thyromegaly. Lungs: Clear to auscultation, nonlabored breathing at rest. Cardiac: Regular rate and rhythm, no S3 or significant systolic murmur, no pericardial rub. Abdomen: Soft, nontender, bowel sounds present. Extremities: No pitting edema, distal pulses 2+. Skin: Warm and dry. Musculoskeletal: No kyphosis. Neuropsychiatric: Alert and oriented x3, affect grossly appropriate.  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  Recent Labwork:    Component Value Date/Time   CHOL 204 (H) 03/26/2018 0815   TRIG 53 03/26/2018 0815   HDL 75 03/26/2018 0815   CHOLHDL 2.7 03/26/2018 0815   VLDL 12 06/01/2015 0856   LDLCALC 115 (H) 03/26/2018 0815    Other Studies Reviewed Today:  Echocardiogram 12/28/2013: Study Conclusions  - Procedure narrative: Transthoracic echocardiography. Image quality was suboptimal. The study was technically difficult, as a result of poor sound wave transmission. - Left ventricle: The cavity size was mildly dilated. Systolic function was low normal to mildly reduced. The estimated ejection  fraction was in the range of 45% to 50%. Mild global hypokinesis. There was an increased relative contribution of atrial contraction to ventricular filling. Doppler parameters are consistent with abnormal left ventricular relaxation (grade 1 diastolic dysfunction). Mild basal septal hypertrophy. - Mitral valve: Mild regurgitation. - Left atrium: The atrium was moderately dilated.  Assessment and Plan:  1.  CAD status post DES to the circumflex/OM in 2015.  He is doing very well with good functional capacity and no angina symptoms on current medical therapy.  In terms of pending right herniorrhaphy under general anesthesia, he should be overall low risk from a cardiac perspective and able to proceed without additional ischemic testing.  He exercises regularly with activities well exceeding 4 METS.  2.  Mixed hyperlipidemia with history of statin intolerance.  Last LDL was 115.  We will try Zetia 10 mg daily.  Current medicines were reviewed with the patient today.   Orders Placed This Encounter  Procedures  . Lipid Profile  Disposition: Follow-up in 6 months.  Signed, Jonelle Sidle, MD, [facility] 10/16/2018 10:31 AM    [facility] at [facility] [address] [place], [place] [address] Phone: [phone]; Fax: [phone]

## 2018-10-16 ENCOUNTER — Encounter: Payer: Self-pay | Admitting: Cardiology

## 2018-10-16 ENCOUNTER — Ambulatory Visit: Payer: Medicare HMO | Admitting: Cardiology

## 2018-10-16 VITALS — BP 124/74 | HR 55 | Ht 73.0 in | Wt 215.0 lb

## 2018-10-16 DIAGNOSIS — Z01810 Encounter for preprocedural cardiovascular examination: Secondary | ICD-10-CM | POA: Diagnosis not present

## 2018-10-16 DIAGNOSIS — I25119 Atherosclerotic heart disease of native coronary artery with unspecified angina pectoris: Secondary | ICD-10-CM | POA: Diagnosis not present

## 2018-10-16 DIAGNOSIS — E782 Mixed hyperlipidemia: Secondary | ICD-10-CM | POA: Diagnosis not present

## 2018-10-16 MED ORDER — EZETIMIBE 10 MG PO TABS: 10.0000 mg | ORAL_TABLET | Freq: Every day | ORAL | 3 refills | Status: DC

## 2018-10-16 NOTE — Patient Instructions (Signed)
Medication Instructions:  START Zetia 10 mg daily If you need a refill on your cardiac medications before your next appointment, please call your pharmacy.   Lab work: Fasting Lipids in 3 months ( Mid May) If you have labs (blood work) drawn today and your tests are completely normal, you will receive your results only by: Marland Kitchen. MyChart Message (if you have MyChart) OR . A paper copy in the mail If you have any lab test that is abnormal or we need to change your treatment, we will call you to review the results.  Testing/Procedures: None today  Follow-Up: At [facility], you and your health needs are our priority.  As part of our continuing mission to provide you with exceptional heart care, we have created designated Provider Care Teams.  These Care Teams include your primary Cardiologist (physician) and Advanced Practice Providers (APPs -  Physician Assistants and Nurse Practitioners) who all work together to provide you with the care you need, when you need it. You will need a follow up appointment in 6 months.  Please call our office 2 months in advance to schedule this appointment.  You may see Nona DellSamuel McDowell, MD or one of the following Advanced Practice Providers on your designated Care Team:   Randall AnBrittany Strader, PA-C [facility]([place] Office) . Jacolyn ReedyMichele Lenze, PA-C [facility]([place] Office)  Any Other Special Instructions Will Be Listed Below (If Applicable). None

## 2019-04-06 ENCOUNTER — Other Ambulatory Visit (HOSPITAL_COMMUNITY)
Admission: RE | Admit: 2019-04-06 | Discharge: 2019-04-06 | Disposition: A | Discharge status: Home / Self Care | Payer: Medicare HMO | Source: Ambulatory Visit | Attending: Cardiology | Admitting: Cardiology

## 2019-04-06 DIAGNOSIS — I25119 Atherosclerotic heart disease of native coronary artery with unspecified angina pectoris: Secondary | ICD-10-CM | POA: Diagnosis present

## 2019-04-06 LAB — LIPID PANEL
Cholesterol: 175 mg/dL (ref 0–200)
HDL: 64 mg/dL (ref >40)
LDL Cholesterol: 103 mg/dL — ABNORMAL HIGH (ref 0–99)
Total CHOL/HDL Ratio: 2.7 RATIO
Triglycerides: 40 mg/dL (ref <150)
VLDL: 8 mg/dL (ref 0–40)

## 2019-04-19 NOTE — Progress Notes (Signed)
 Cardiology Office Note  Date: 04/20/2019   ID: [name], DOB 01/10/1951, MRN [id number]  PCP:  Kari BaarsHawkins, Edward, MD  Cardiologist:  Nona DellSamuel , MD Electrophysiologist:  None   Chief Complaint  Patient presents with  . Cardiac follow-up    History of Present Illness: Jeffery Goodwin is a 68 y.o. male last seen in February.  He presents for a routine visit.  He tells me that he has been doing well overall from a cardiac perspective, no obvious angina symptoms or major functional limitations.  He has not been swimming, but does do regular spinning and weight work in his house and enjoys kayaking.  Recent follow-up lipid panel is outlined below.  LDL came down to 103 on Zetia.  He has been doing well without obvious side effects having not tolerated statins previously.  He is a vegetarian at this point as well.  I reviewed his remaining medications which are listed below.  Past Medical History:  Diagnosis Date  . Arthritis   . Carpal tunnel syndrome   . Coronary atherosclerosis of native coronary artery    DES mid LCx/OM2 12/2013  . Dry eyes   . GERD (gastroesophageal reflux disease)   . History of shingles   . Hyperlipidemia   . Neck pain   . Nocturia   . Tremor    Right hand    Past Surgical History:  Procedure Laterality Date  . ANTERIOR CERVICAL DECOMP/DISCECTOMY FUSION N/A 12/20/2014   Procedure: ANTERIOR CERVICAL DECOMPRESSION/DISCECTOMY FUSION 3 LEVELS;  Surgeon: Julio SicksHenry Pool, MD;  [facility];  Service: Neurosurgery;  Laterality: N/A;  ANTERIOR CERVICAL DECOMPRESSION/DISCECTOMY FUSION 3 LEVELS C3-4,4-5,5-6  . Arthroscopic left knee surgery  2005/2013  . Carpal tunnel repair Right 1998  . COLONOSCOPY     x2  . COLONOSCOPY WITH PROPOFOL N/A 09/09/2017   Procedure: COLONOSCOPY WITH PROPOFOL;  Surgeon: Corbin Adeourk, Robert M, MD;  [facility];  Service: Endoscopy;  Laterality: N/A;  8:30am  . INTRAMEDULLARY (IM) NAIL INTERTROCHANTERIC Left 09/13/2015    Procedure: INTRAMEDULLARY  NAIL INTERTROCHANTRIC;  Surgeon: Vickki HearingStanley E Harrison, MD;  Location: [facility];  Service: Orthopedics;  Laterality: Left;  . LEFT HEART CATHETERIZATION WITH CORONARY ANGIOGRAM N/A 12/11/2013   Procedure: LEFT HEART CATHETERIZATION WITH CORONARY ANGIOGRAM;  Surgeon: Peter M SwazilandJordan, MD;  [facility];  Service: Cardiovascular;  Laterality: N/A;  . Left inguinal herniorrhaphy  2008    Current Outpatient Medications  Medication Sig Dispense Refill  . aspirin 81 MG chewable tablet Chew daily by mouth. Takes 1/2 tablet daily    . cholecalciferol (VITAMIN D) 1000 units tablet Take 2,000 Units daily by mouth.     . Cyanocobalamin (B-12) 3000 MCG CAPS Take 1 capsule by mouth daily.    Marland Kitchen. ezetimibe (ZETIA) 10 MG tablet Take 1 tablet (10 mg total) by mouth daily. 90 tablet 3  . Misc Natural Products (GLUCOS-CHONDROIT-MSM COMPLEX PO) Take 3 tablets by mouth every morning.     . Omega-3 Fatty Acids (FISH OIL) 1000 MG CPDR Take 4,000 mg by mouth daily.     . Probiotic Product (PROBIOTIC-10 ULTIMATE PO) Take by mouth.    . propranolol (INDERAL) 10 MG tablet Take 5 mg by mouth daily.     . Saw Palmetto 450 MG CAPS Take 450 mg by mouth daily.    . Tetrahydrozoline HCl (VISINE OP) Place 1 drop into both eyes 2 (two) times daily.    . vitamin C (ASCORBIC ACID)  500 MG tablet Take 500 mg by mouth daily.     No current facility-administered medications for this visit.    Allergies:  Asa [aspirin]   Social History: The patient  reports that he quit smoking about 10 years ago. His smoking use included cigarettes. He started smoking about 46 years ago. He has a 74.00 pack-year smoking history. He has never used smokeless tobacco. He reports current alcohol use of about 2.0 standard drinks of alcohol per week. He reports that he does not use drugs.   ROS:  Please see the history of present illness. Otherwise, complete review of systems is positive for musculoskeletal shoulder  discomfort.  All other systems are reviewed and negative.   Physical Exam: VS:  BP 135/77   Pulse (!) 43   Temp (!) 97.5 F (36.4 C)   Ht 6\' 1"  (1.854 m)   Wt 215 lb (97.5 kg)   BMI 28.37 kg/m , BMI Body mass index is 28.37 kg/m.  Wt Readings from Last 3 Encounters:  04/20/19 215 lb (97.5 kg)  10/16/18 215 lb (97.5 kg)  04/14/18 210 lb (95.3 kg)    General: Patient appears comfortable at rest. HEENT: Conjunctiva and lids normal, wearing a mask. Neck: Supple, no elevated JVP or carotid bruits, no thyromegaly. Lungs: Clear to auscultation, nonlabored breathing at rest. Cardiac: Regular rate and rhythm, no S3 or significant systolic murmur. Abdomen: Soft, nontender, bowel sounds present. Extremities: No pitting edema, distal pulses 2+. Skin: Warm and dry. Musculoskeletal: No kyphosis. Neuropsychiatric: Alert and oriented x3, affect grossly appropriate.  ECG:  An ECG dated 10/16/2018 was personally reviewed today and demonstrated:  Minus bradycardia with sinus arrhythmia, and IVCD/left anterior fascicular block.  Recent Labwork:    Component Value Date/Time   CHOL 175 04/06/2019 0904   TRIG 40 04/06/2019 0904   HDL 64 04/06/2019 0904   CHOLHDL 2.7 04/06/2019 0904   VLDL 8 04/06/2019 0904   LDLCALC 103 (H) 04/06/2019 0904   LDLCALC 115 (H) 03/26/2018 0815    Other Studies Reviewed Today:  Echocardiogram 12/28/2013: Study Conclusions  - Procedure narrative: Transthoracic echocardiography. Image quality was suboptimal. The study was technically difficult, as a result of poor sound wave transmission. - Left ventricle: The cavity size was mildly dilated. Systolic function was low normal to mildly reduced. The estimated ejection fraction was in the range of 45% to 50%. Mild global hypokinesis. There was an increased relative contribution of atrial contraction to ventricular filling. Doppler parameters are consistent with abnormal left ventricular  relaxation (grade 1 diastolic dysfunction). Mild basal septal hypertrophy. - Mitral valve: Mild regurgitation. - Left atrium: The atrium was moderately dilated.  Assessment and Plan:  1.  CAD status post DES to circumflex/OM in 2015.  We have continued with medical therapy and observation in the absence of angina symptoms and he reports very good exercise tolerance.  Continue aspirin and Zetia.  2.  Mixed hyperlipidemia with statin intolerance.  LDL has come down to 103 on Zetia.  He reports no intolerances.  We have discussed other treatment options in the past including PCSK9 inhibitor.  He is comfortable with the current plan.  Medication Adjustments/Labs and Tests Ordered: Current medicines are reviewed at length with the patient today.  Concerns regarding medicines are outlined above.   Tests Ordered: No orders of the defined types were placed in this encounter.   Medication Changes: No orders of the defined types were placed in this encounter.   Disposition:  Follow up 6  months in the [place] office.  Signed, Jonelle SidleSamuel G. , MD, [facility] 04/20/2019 11:49 AM    [facility] at [facility] [address], [place], [place] [address] Phone: [phone]; Fax: [phone]

## 2019-04-20 ENCOUNTER — Encounter: Payer: Self-pay | Admitting: Cardiology

## 2019-04-20 ENCOUNTER — Ambulatory Visit: Payer: Medicare HMO | Admitting: Cardiology

## 2019-04-20 ENCOUNTER — Other Ambulatory Visit: Payer: Self-pay

## 2019-04-20 VITALS — BP 135/77 | HR 43 | Temp 97.5°F | Ht 73.0 in | Wt 215.0 lb

## 2019-04-20 DIAGNOSIS — I25119 Atherosclerotic heart disease of native coronary artery with unspecified angina pectoris: Secondary | ICD-10-CM | POA: Diagnosis not present

## 2019-04-20 DIAGNOSIS — E782 Mixed hyperlipidemia: Secondary | ICD-10-CM | POA: Diagnosis not present

## 2019-04-20 DIAGNOSIS — Z789 Other specified health status: Secondary | ICD-10-CM | POA: Diagnosis not present

## 2019-04-20 NOTE — Patient Instructions (Signed)
Medication Instructions: Your physician recommends that you continue on your current medications as directed. Please refer to the Current Medication list given to you today.   Labwork: None today  Procedures/Testing: [place] today  Follow-Up: 6 months with Dr.McDowell  Any Additional Special Instructions Will Be Listed Below (If Applicable).     If you need a refill on your cardiac medications before your next appointment, please call your pharmacy.         Thank you for choosing [facility] !

## 2019-09-28 ENCOUNTER — Ambulatory Visit: Payer: Medicare HMO | Attending: Internal Medicine

## 2019-09-28 DIAGNOSIS — Z23 Encounter for immunization: Secondary | ICD-10-CM | POA: Insufficient documentation

## 2019-09-28 NOTE — Progress Notes (Signed)
   Covid-19 Vaccination Clinic  Name:  Jeffery Goodwin    MRN: [id number] DOB: 07-Apr-1951  09/28/2019  Jeffery Goodwin was observed post Covid-19 immunization for 15 minutes without incidence. He was provided with Vaccine Information Sheet and instruction to access the V-Safe system.   Jeffery Goodwin was instructed to call 911 with any severe reactions post vaccine: Marland Kitchen Difficulty breathing  . Swelling of your face and throat  . A fast heartbeat  . A bad rash all over your body  . Dizziness and weakness    Immunizations Administered    Name Date Dose VIS Date Route   Pfizer COVID-19 Vaccine 09/28/2019  9:22 AM 0.3 mL 08/14/2019 Intramuscular   Manufacturer: [employer], [employer]   Lot: [id number]   NDC: [address]-9

## 2019-10-13 ENCOUNTER — Other Ambulatory Visit: Payer: Self-pay

## 2019-10-13 MED ORDER — EZETIMIBE 10 MG PO TABS: 10.0000 mg | ORAL_TABLET | Freq: Every day | ORAL | 3 refills | Status: DC

## 2019-10-13 NOTE — Telephone Encounter (Signed)
zetia refilled. 

## 2019-10-19 ENCOUNTER — Ambulatory Visit: Payer: Medicare HMO | Attending: Internal Medicine

## 2019-10-19 DIAGNOSIS — Z23 Encounter for immunization: Secondary | ICD-10-CM | POA: Insufficient documentation

## 2019-10-19 NOTE — Progress Notes (Signed)
   Covid-19 Vaccination Clinic  Name:  Jeffery Goodwin    MRN: [id number] DOB: 1951-01-15  10/19/2019  [name] was observed post Covid-19 immunization for 15 minutes without incidence. He was provided with Vaccine Information Sheet and instruction to access the V-Safe system.   [name] was instructed to call 911 with any severe reactions post vaccine: Marland Kitchen Difficulty breathing  . Swelling of your face and throat  . A fast heartbeat  . A bad rash all over your body  . Dizziness and weakness    Immunizations Administered    Name Date Dose VIS Date Route   Pfizer COVID-19 Vaccine 10/19/2019 10:17 AM 0.3 mL 08/14/2019 Intramuscular   Manufacturer: [employer], [employer]   Lot: [id number]   NDC: [address]-0

## 2019-10-23 ENCOUNTER — Telehealth: Payer: Self-pay | Admitting: Cardiology

## 2019-10-23 DIAGNOSIS — E782 Mixed hyperlipidemia: Secondary | ICD-10-CM

## 2019-10-23 NOTE — Telephone Encounter (Signed)
Pt notified, lab slip mailed.

## 2019-10-23 NOTE — Telephone Encounter (Signed)
Yes, please check FLP for visit.

## 2019-10-23 NOTE — Telephone Encounter (Signed)
I will defer to Dr.McDowell. Lipids done 04/2019, cbc, bmet 09/2017

## 2019-10-23 NOTE — Telephone Encounter (Signed)
Pt is wanting to know if he's needing to have his labs done before his apt w/ Dr. Diona Browner

## 2019-10-30 ENCOUNTER — Other Ambulatory Visit (HOSPITAL_COMMUNITY)
Admission: RE | Admit: 2019-10-30 | Discharge: 2019-10-30 | Disposition: A | Discharge status: Home / Self Care | Payer: Medicare HMO | Source: Ambulatory Visit | Attending: Cardiology | Admitting: Cardiology

## 2019-10-30 ENCOUNTER — Other Ambulatory Visit: Payer: Self-pay

## 2019-10-30 DIAGNOSIS — E782 Mixed hyperlipidemia: Secondary | ICD-10-CM | POA: Insufficient documentation

## 2019-10-30 LAB — LIPID PANEL
Cholesterol: 192 mg/dL (ref 0–200)
HDL: 63 mg/dL (ref >40)
LDL Cholesterol: 117 mg/dL — ABNORMAL HIGH (ref 0–99)
Total CHOL/HDL Ratio: 3 RATIO
Triglycerides: 58 mg/dL (ref <150)
VLDL: 12 mg/dL (ref 0–40)

## 2019-11-04 NOTE — Progress Notes (Signed)
Cardiology Office Note  Date: 11/05/2019   ID: Goodwin, Jeffery [birth year], MRN [id number]  PCP:  Jeffery Stabile, MD  Cardiologist:  Jeffery Dell, MD Electrophysiologist:  None   Chief Complaint  Patient presents with  . Cardiac follow-up    History of Present Illness: Jeffery Goodwin is a 69 y.o. male last seen in August 2020.  He presents for a routine visit.  He continues to do well, no regular angina symptoms and still making good effort at exercise and diet.  He states that he did relax his diet somewhat in the last 6 months in terms of carbohydrates.  Recent lipids are outlined below, HDL 63 and LDL 117.  Total cholesterol to HDL ratio 3.0.  He has continued on Zetia with statin intolerance.  I have talked with him about possibility of evaluation in the lipid clinic for PCSK9 inhibitors.  Tolerates low-dose aspirin, otherwise continues on Zetia and Inderal.  I personally reviewed his ECG today which shows sinus bradycardia with nonspecific ST changes.  Past Medical History:  Diagnosis Date  . Arthritis   . Carpal tunnel syndrome   . Coronary atherosclerosis of native coronary artery    DES mid LCx/OM2 12/2013  . Dry eyes   . GERD (gastroesophageal reflux disease)   . History of shingles   . Hyperlipidemia   . Neck pain   . Nocturia   . Tremor    Right hand    Past Surgical History:  Procedure Laterality Date  . ANTERIOR CERVICAL DECOMP/DISCECTOMY FUSION N/A 12/20/2014   Procedure: ANTERIOR CERVICAL DECOMPRESSION/DISCECTOMY FUSION 3 LEVELS;  Surgeon: [name], MD;  [facility]: [facility];  Service: Neurosurgery;  Laterality: N/A;  ANTERIOR CERVICAL DECOMPRESSION/DISCECTOMY FUSION 3 LEVELS C3-4,4-5,5-6  . Arthroscopic left knee surgery  2005/2013  . Carpal tunnel repair Right 1998  . COLONOSCOPY     x2  . COLONOSCOPY WITH PROPOFOL N/A 09/09/2017   Procedure: COLONOSCOPY WITH PROPOFOL;  Surgeon: [name], MD;  Location: [facility];  Service: Endoscopy;   Laterality: N/A;  8:30am  . INTRAMEDULLARY (IM) NAIL INTERTROCHANTERIC Left 09/13/2015   Procedure: INTRAMEDULLARY  NAIL INTERTROCHANTRIC;  Surgeon: [name], MD;  Location: [facility];  Service: Orthopedics;  Laterality: Left;  . LEFT HEART CATHETERIZATION WITH CORONARY ANGIOGRAM N/A 12/11/2013   Procedure: LEFT HEART CATHETERIZATION WITH CORONARY ANGIOGRAM;  Surgeon: [name], MD;  [facility];  Service: Cardiovascular;  Laterality: N/A;  . Left inguinal herniorrhaphy  2008    Current Outpatient Medications  Medication Sig Dispense Refill  . cholecalciferol (VITAMIN D) 1000 units tablet Take 2,000 Units daily by mouth.     . Cyanocobalamin (B-12) 3000 MCG CAPS Take 1 capsule by mouth daily.    Marland Kitchen ezetimibe (ZETIA) 10 MG tablet Take 1 tablet (10 mg total) by mouth daily. 90 tablet 3  . Misc Natural Products (GLUCOS-CHONDROIT-MSM COMPLEX PO) Take 3 tablets by mouth every morning.     . Omega-3 Fatty Acids (FISH OIL) 1000 MG CPDR Take 4,000 mg by mouth daily.     . Probiotic Product (PROBIOTIC-10 ULTIMATE PO) Take by mouth.    . propranolol (INDERAL) 10 MG tablet Take 5 mg by mouth daily.     . Saw Palmetto 450 MG CAPS Take 450 mg by mouth daily.    . vitamin C (ASCORBIC ACID) 500 MG tablet Take 500 mg by mouth daily.     No current facility-administered medications for this visit.  Allergies:  Asa [aspirin]   ROS:   Stable arthritic symptoms.  Physical Exam: VS:  BP 140/86   Pulse (!) 50   Temp 97.8 F (36.6 C)   Ht 6\' 1"  (1.854 m)   Wt 213 lb (96.6 kg)   SpO2 96%   BMI 28.10 kg/m , BMI Body mass index is 28.1 kg/m.  Wt Readings from Last 3 Encounters:  11/05/19 213 lb (96.6 kg)  04/20/19 215 lb (97.5 kg)  10/16/18 215 lb (97.5 kg)    General: Patient appears comfortable at rest. HEENT: Conjunctiva and lids normal, wearing a mask. Neck: Supple, no elevated JVP or carotid bruits, no thyromegaly. Lungs: Clear to auscultation, nonlabored breathing at  rest. Cardiac: Regular rate and rhythm, no S3 or significant systolic murmur. Extremities: No pitting edema, distal pulses 2+.  ECG:  An ECG dated 10/16/2018 was personally reviewed today and demonstrated:  Sinus bradycardia with sinus arrhythmia, IVCD/left anterior fascicular block.  Recent Labwork:    Component Value Date/Time   CHOL 192 10/30/2019 0859   TRIG 58 10/30/2019 0859   HDL 63 10/30/2019 0859   CHOLHDL 3.0 10/30/2019 0859   VLDL 12 10/30/2019 0859   LDLCALC 117 (H) 10/30/2019 0859   LDLCALC 115 (H) 03/26/2018 0815    Other Studies Reviewed Today:  Echocardiogram 12/28/2013: Study Conclusions  - Procedure narrative: Transthoracic echocardiography. Image quality was suboptimal. The study was technically difficult, as a result of poor sound wave transmission. - Left ventricle: The cavity size was mildly dilated. Systolic function was low normal to mildly reduced. The estimated ejection fraction was in the range of 45% to 50%. Mild global hypokinesis. There was an increased relative contribution of atrial contraction to ventricular filling. Doppler parameters are consistent with abnormal left ventricular relaxation (grade 1 diastolic dysfunction). Mild basal septal hypertrophy. - Mitral valve: Mild regurgitation. - Left atrium: The atrium was moderately dilated.  Assessment and Plan:  1.  CAD with history of DES to the circumflex/OM in 2015.  No progressive angina on minimal medical therapy.  He continues to exercise regularly and following a good diet overall.  Continue low-dose aspirin and Zetia.  2.  Mixed hyperlipidemia with statin intolerance.  Recent lipid numbers reviewed on Zetia.  HDL 63 and LDL 117 with total cholesterol to HDL ratio 3.0.  Continue with efforts at diet.  I did talk with him about consultation in the lipid clinic if he would like, PCSK9 inhibitors remain a possibility.  Medication Adjustments/Labs and Tests  Ordered: Current medicines are reviewed at length with the patient today.  Concerns regarding medicines are outlined above.   Tests Ordered: Orders Placed This Encounter  Procedures  . Lipid Profile  . EKG 12-Lead    Medication Changes: No orders of the defined types were placed in this encounter.   Disposition:  Follow up 6 months in the [place] office.  Signed, Satira Sark, MD, [facility] 11/05/2019 10:57 AM    [place] at [facility] [address], [place], [place] [address] Phone: [phone]; Fax: [phone]

## 2019-11-05 ENCOUNTER — Encounter: Payer: Self-pay | Admitting: Cardiology

## 2019-11-05 ENCOUNTER — Other Ambulatory Visit: Payer: Self-pay

## 2019-11-05 ENCOUNTER — Ambulatory Visit: Payer: Medicare HMO | Admitting: Cardiology

## 2019-11-05 VITALS — BP 140/86 | HR 50 | Temp 97.8°F | Ht 73.0 in | Wt 213.0 lb

## 2019-11-05 DIAGNOSIS — Z789 Other specified health status: Secondary | ICD-10-CM | POA: Diagnosis not present

## 2019-11-05 DIAGNOSIS — E782 Mixed hyperlipidemia: Secondary | ICD-10-CM

## 2019-11-05 DIAGNOSIS — I25119 Atherosclerotic heart disease of native coronary artery with unspecified angina pectoris: Secondary | ICD-10-CM

## 2019-11-05 NOTE — Patient Instructions (Signed)
Medication Instructions:  Your physician recommends that you continue on your current medications as directed. Please refer to the Current Medication list given to you today.  *If you need a refill on your cardiac medications before your next appointment, please call your pharmacy*   Lab Work: Fasting Lipid profile JUST BEFORE next visit in 6 months   If you have labs (blood work) drawn today and your tests are completely normal, you will receive your results only by: Marland Kitchen MyChart Message (if you have MyChart) OR . A paper copy in the mail If you have any lab test that is abnormal or we need to change your treatment, we will call you to review the results.   Testing/Procedures: None today   Follow-Up: At [facility], you and your health needs are our priority.  As part of our continuing mission to provide you with exceptional heart care, we have created designated Provider Care Teams.  These Care Teams include your primary Cardiologist (physician) and Advanced Practice Providers (APPs -  Physician Assistants and Nurse Practitioners) who all work together to provide you with the care you need, when you need it.  We recommend signing up for the patient portal called "MyChart".  Sign up information is provided on this After Visit Summary.  MyChart is used to connect with patients for Virtual Visits (Telemedicine).  Patients are able to view lab/test results, encounter notes, upcoming appointments, etc.  Non-urgent messages can be sent to your provider as well.   To learn more about what you can do with MyChart, go to [email or web address].    Your next appointment:   6 month(s)  The format for your next appointment:   In Person  Provider:   Nona Dell, MD   Other Instructions None     Thank you for choosing [facility] !

## 2019-11-09 ENCOUNTER — Ambulatory Visit: Payer: Medicare HMO | Admitting: Family Medicine

## 2019-11-23 ENCOUNTER — Other Ambulatory Visit (HOSPITAL_COMMUNITY): Payer: Self-pay | Admitting: Internal Medicine

## 2019-11-23 DIAGNOSIS — M8580 Other specified disorders of bone density and structure, unspecified site: Secondary | ICD-10-CM

## 2020-02-23 ENCOUNTER — Other Ambulatory Visit: Payer: Self-pay | Admitting: Internal Medicine

## 2020-02-23 ENCOUNTER — Other Ambulatory Visit (HOSPITAL_COMMUNITY): Payer: Self-pay | Admitting: Internal Medicine

## 2020-02-23 DIAGNOSIS — Z87891 Personal history of nicotine dependence: Secondary | ICD-10-CM

## 2020-02-23 DIAGNOSIS — F17210 Nicotine dependence, cigarettes, uncomplicated: Secondary | ICD-10-CM

## 2020-03-02 ENCOUNTER — Other Ambulatory Visit: Payer: Self-pay

## 2020-03-02 ENCOUNTER — Ambulatory Visit (HOSPITAL_COMMUNITY)
Admission: RE | Admit: 2020-03-02 | Discharge: 2020-03-02 | Disposition: A | Discharge status: Home / Self Care | Payer: Medicare HMO | Source: Ambulatory Visit | Attending: Internal Medicine | Admitting: Internal Medicine

## 2020-03-02 DIAGNOSIS — M8580 Other specified disorders of bone density and structure, unspecified site: Secondary | ICD-10-CM

## 2020-03-02 DIAGNOSIS — M85851 Other specified disorders of bone density and structure, right thigh: Secondary | ICD-10-CM | POA: Diagnosis not present

## 2020-03-17 ENCOUNTER — Ambulatory Visit (HOSPITAL_COMMUNITY)
Admission: RE | Admit: 2020-03-17 | Discharge: 2020-03-17 | Disposition: A | Discharge status: Home / Self Care | Payer: Medicare HMO | Source: Ambulatory Visit | Attending: Internal Medicine | Admitting: Internal Medicine

## 2020-03-17 ENCOUNTER — Telehealth: Payer: Self-pay | Admitting: Cardiology

## 2020-03-17 ENCOUNTER — Other Ambulatory Visit: Payer: Self-pay

## 2020-03-17 DIAGNOSIS — F17210 Nicotine dependence, cigarettes, uncomplicated: Secondary | ICD-10-CM

## 2020-03-17 DIAGNOSIS — Z87891 Personal history of nicotine dependence: Secondary | ICD-10-CM | POA: Diagnosis present

## 2020-03-17 MED ORDER — ASPIRIN EC 81 MG PO TBEC: 81.0000 mg | DELAYED_RELEASE_TABLET | Freq: Every day | ORAL | 3 refills | Status: AC

## 2020-03-17 NOTE — Telephone Encounter (Signed)
New message    STAT if patient feels like he/she is going to faint   1) Are you dizzy now? no  2) Do you feel faint or have you passed out? no  3) Do you have any other symptoms? Patient states he got up to go to the restroom and he got dizzy , he checked his bp and it was 109/74 and 106/73 hr 75 - he doesn't know if it has anything to do with the fact that he stopped taking the baby aspirin after he had the stent put in?   4) Have you checked your HR and BP (record if available)? 109/74 and 106/73 hr 75 -

## 2020-03-17 NOTE — Telephone Encounter (Signed)
     Covering for Dr. Diona Browner - ASA should not impact his blood pressure and he should be on ASA 81mg  daily given his prior stent placement. The only medication that impacts his BP is Propranolol and it appears he is already on the lowest dose of 5mg  daily. I would recommend holding this for now and continue to follow BP if he is still having dizziness.  Signed, , PA-C 03/17/2020, 4:17 PM Pager: [phone]

## 2020-03-17 NOTE — Telephone Encounter (Signed)
Pt will re-start ASA EC 81 mg qd.patient bought new BP machine today as he felt old machine was in accurate. He wants to wait to hold propranolol

## 2020-05-18 ENCOUNTER — Other Ambulatory Visit (HOSPITAL_COMMUNITY)
Admission: RE | Admit: 2020-05-18 | Discharge: 2020-05-18 | Disposition: A | Discharge status: Home / Self Care | Payer: Medicare HMO | Source: Ambulatory Visit | Attending: Cardiology | Admitting: Cardiology

## 2020-05-18 ENCOUNTER — Other Ambulatory Visit: Payer: Self-pay

## 2020-05-18 DIAGNOSIS — E782 Mixed hyperlipidemia: Secondary | ICD-10-CM | POA: Insufficient documentation

## 2020-05-18 LAB — LIPID PANEL
Cholesterol: 186 mg/dL (ref 0–200)
HDL: 69 mg/dL (ref >40)
LDL Cholesterol: 110 mg/dL — ABNORMAL HIGH (ref 0–99)
Total CHOL/HDL Ratio: 2.7 RATIO
Triglycerides: 34 mg/dL (ref <150)
VLDL: 7 mg/dL (ref 0–40)

## 2020-05-25 ENCOUNTER — Other Ambulatory Visit: Payer: Self-pay

## 2020-05-25 ENCOUNTER — Ambulatory Visit: Payer: Medicare HMO | Admitting: Cardiology

## 2020-05-25 ENCOUNTER — Encounter: Payer: Self-pay | Admitting: Cardiology

## 2020-05-25 VITALS — BP 138/78 | HR 55 | Wt 212.0 lb

## 2020-05-25 DIAGNOSIS — I25119 Atherosclerotic heart disease of native coronary artery with unspecified angina pectoris: Secondary | ICD-10-CM | POA: Diagnosis not present

## 2020-05-25 DIAGNOSIS — Z789 Other specified health status: Secondary | ICD-10-CM

## 2020-05-25 MED ORDER — ROSUVASTATIN CALCIUM 5 MG PO TABS: ORAL_TABLET | ORAL | 6 refills | Status: DC

## 2020-05-25 NOTE — Progress Notes (Signed)
Cardiology Office Note  Date: 05/25/2020   ID: Hesham, Womac 02-Nov-1950, MRN [id number]  PCP:  Benita Stabile, MD  Cardiologist:  Nona Dell, MD Electrophysiologist:  None   Chief Complaint  Patient presents with  . Cardiac follow-up    History of Present Illness: RONNEL ZUERCHER is a 68 y.o. male last seen in March.  He presents for a routine visit.  Reports no angina symptoms at this time on medical therapy.  He has not been able to swim regularly due to worsening neck pain, and is actually anticipating repeat cervical spine surgery.  He has been able to use his spin cycle at home.  Recent lipids are reviewed below.  He has prior intolerance to Lipitor and Pravachol, has been on Zetia.  LDL has been hovering around 110-115 range.  We have talked about PCSK9 inhibitors.  After discussion today he is willing to try very low-dose Crestor first.  He did have a chest CT done in July for lung cancer screening which incidentally mention multivessel distribution coronary artery calcification.  We discussed arranging a follow-up Lexiscan Myoview to reevaluate ischemic burden, DES intervention was back in 2015.  Past Medical History:  Diagnosis Date  . Arthritis   . Carpal tunnel syndrome   . Coronary atherosclerosis of native coronary artery    DES mid LCx/OM2 12/2013  . Dry eyes   . GERD (gastroesophageal reflux disease)   . History of shingles   . Hyperlipidemia   . Neck pain   . Nocturia   . Tremor    Right hand    Past Surgical History:  Procedure Laterality Date  . ANTERIOR CERVICAL DECOMP/DISCECTOMY FUSION N/A 12/20/2014   Procedure: ANTERIOR CERVICAL DECOMPRESSION/DISCECTOMY FUSION 3 LEVELS;  Surgeon: [name], MD;  Location: [facility];  Service: Neurosurgery;  Laterality: N/A;  ANTERIOR CERVICAL DECOMPRESSION/DISCECTOMY FUSION 3 LEVELS C3-4,4-5,5-6  . Arthroscopic left knee surgery  2005/2013  . Carpal tunnel repair Right 1998  . COLONOSCOPY     x2  .  COLONOSCOPY WITH PROPOFOL N/A 09/09/2017   Procedure: COLONOSCOPY WITH PROPOFOL;  Surgeon: [name], MD;  [facility]: [facility];  Service: Endoscopy;  Laterality: N/A;  8:30am  . INTRAMEDULLARY (IM) NAIL INTERTROCHANTERIC Left 09/13/2015   Procedure: INTRAMEDULLARY  NAIL INTERTROCHANTRIC;  Surgeon: [name], MD;  Location: [facility] ORS;  Service: Orthopedics;  Laterality: Left;  . LEFT HEART CATHETERIZATION WITH CORONARY ANGIOGRAM N/A 12/11/2013   Procedure: LEFT HEART CATHETERIZATION WITH CORONARY ANGIOGRAM;  Surgeon: [name], MD;  Location: [facility] CATH LAB;  Service: Cardiovascular;  Laterality: N/A;  . Left inguinal herniorrhaphy  2008    Current Outpatient Medications  Medication Sig Dispense Refill  . aspirin EC 81 MG tablet Take 1 tablet (81 mg total) by mouth daily. Swallow whole. 90 tablet 3  . cholecalciferol (VITAMIN D) 1000 units tablet Take 2,000 Units daily by mouth.     . Cyanocobalamin (B-12) 3000 MCG CAPS Take 1 capsule by mouth daily.    Marland Kitchen ezetimibe (ZETIA) 10 MG tablet Take 1 tablet (10 mg total) by mouth daily. 90 tablet 3  . Misc Natural Products (GLUCOS-CHONDROIT-MSM COMPLEX PO) Take 3 tablets by mouth every morning.     . Omega-3 Fatty Acids (FISH OIL) 1000 MG CPDR Take 4,000 mg by mouth daily.     . Probiotic Product (PROBIOTIC-10 ULTIMATE PO) Take by mouth.    . propranolol (INDERAL) 10 MG tablet Take 5 mg by mouth  daily.     . Saw Palmetto 450 MG CAPS Take 450 mg by mouth daily.    . vitamin C (ASCORBIC ACID) 500 MG tablet Take 500 mg by mouth daily.    . rosuvastatin (CRESTOR) 5 MG tablet Take 5 mg weekly for 3 weeks, if tolerated, increase to twice a week (Monday,Friday) 30 tablet 6   No current facility-administered medications for this visit.   Allergies:  Asa [aspirin]   ROS:   No palpitations or syncope.  Physical Exam: VS:  BP 138/78   Pulse (!) 55   Wt 212 lb (96.2 kg)   SpO2 97%   BMI 27.97 kg/m , BMI Body mass index is 27.97  kg/m.  Wt Readings from Last 3 Encounters:  05/25/20 212 lb (96.2 kg)  11/05/19 213 lb (96.6 kg)  04/20/19 215 lb (97.5 kg)    General: Patient appears comfortable at rest. HEENT: Conjunctiva and lids normal, wearing a mask. Neck: No elevated JVP. Lungs: Clear to auscultation, nonlabored breathing at rest. Cardiac: Regular rate and rhythm, no S3 or significant systolic murmur, no pericardial rub. Extremities: No pitting edema, distal pulses 2+.  ECG:  An ECG dated 11/04/2019 was personally reviewed today and demonstrated:  Sinus bradycardia with nonspecific ST-T changes.  Recent Labwork:    Component Value Date/Time   CHOL 186 05/18/2020 1255   TRIG 34 05/18/2020 1255   HDL 69 05/18/2020 1255   CHOLHDL 2.7 05/18/2020 1255   VLDL 7 05/18/2020 1255   LDLCALC 110 (H) 05/18/2020 1255   LDLCALC 115 (H) 03/26/2018 0815    Other Studies Reviewed Today:  Echocardiogram 12/28/2013: Study Conclusions  - Procedure narrative: Transthoracic echocardiography. Image quality was suboptimal. The study was technically difficult, as a result of poor sound wave transmission. - Left ventricle: The cavity size was mildly dilated. Systolic function was low normal to mildly reduced. The estimated ejection fraction was in the range of 45% to 50%. Mild global hypokinesis. There was an increased relative contribution of atrial contraction to ventricular filling. Doppler parameters are consistent with abnormal left ventricular relaxation (grade 1 diastolic dysfunction). Mild basal septal hypertrophy. - Mitral valve: Mild regurgitation. - Left atrium: The atrium was moderately dilated.  Chest CT 03/17/2020: FINDINGS: Cardiovascular: Normal heart size. Aortic atherosclerosis. Lad, left circumflex and RCA coronary artery calcifications.  Mediastinum/Nodes: No enlarged mediastinal, hilar, or axillary lymph nodes. Thyroid gland, trachea, and esophagus demonstrate no significant  findings.  Lungs/Pleura: Mild centrilobular emphysema. No pleural effusion, airspace consolidation or atelectasis. No worrisome pulmonary nodules identified at this time.  Upper Abdomen: No acute abnormality.  Musculoskeletal: Spondylosis identified within the lower thoracic spine. No acute or suspicious osseous findings.  IMPRESSION: 1. Lung-RADS 1, negative. Continue annual screening with low-dose chest CT without contrast in 12 months. 2. Multi vessel coronary artery calcifications. 3. Emphysema and aortic atherosclerosis.  Aortic Atherosclerosis (ICD10-I70.0) and Emphysema (ICD10-J43.9).  Assessment and Plan:  1.  CAD status post DES to the circumflex/OM in 2015.  He has done very well without active angina on medical therapy, generally good exercise tolerance and following a fairly strict diet.  He did have a recent chest CT for lung cancer screening, incidental mention of multivessel distribution coronary artery calcification.  In light of this and anticipated spine surgery, we will arrange a follow-up Lexiscan Myoview to evaluate ischemic burden.  Continue aspirin, Zetia, initiate trial of very low-dose Crestor.  2.  Mixed hyperlipidemia with history of intolerance to Lipitor and Pravachol.  We have discussed  possibility of PCSK9 inhibitors.  After discussion today he is willing to try very low-dose Crestor which we will start at 5 mg once weekly and if tolerated increase to twice weekly. Repeat FLP and LFTs in 12 weeks if he is able to stay on the medication.  Continue Zetia as well.  Medication Adjustments/Labs and Tests Ordered: Current medicines are reviewed at length with the patient today.  Concerns regarding medicines are outlined above.   Tests Ordered: Orders Placed This Encounter  Procedures  . NM Myocar Multi W/Spect W/Wall Motion / EF  . Lipid Profile  . Hepatic function panel    Medication Changes: Meds ordered this encounter  Medications  .  rosuvastatin (CRESTOR) 5 MG tablet    Sig: Take 5 mg weekly for 3 weeks, if tolerated, increase to twice a week (Monday,Friday)    Dispense:  30 tablet    Refill:  6    Disposition:  Follow up 6 months in the [place] office.  Signed, Jonelle Sidle, MD, [facility] 05/25/2020 10:55 AM    [facility] at [facility] [address], [place], [place] [address] Phone: [phone]; Fax: [phone]

## 2020-05-25 NOTE — Patient Instructions (Signed)
Medication Instructions:  START Crestor 5 mg weekly for 3 weeks.If tolerated, increase to twice a week (Monday, Friday)  *If you need a refill on your cardiac medications before your next appointment, please call your pharmacy*   Lab Work: Fasting Lipid and LFT's in 12 weeks (mid December)    If you have labs (blood work) drawn today and your tests are completely normal, you will receive your results only by: Marland Kitchen MyChart Message (if you have MyChart) OR . A paper copy in the mail If you have any lab test that is abnormal or we need to change your treatment, we will call you to review the results.   Testing/Procedures: Your physician has requested that you have a lexiscan myoview. For further information please visit [email or web address] Please follow instruction sheet, as given.     Follow-Up: At [facility], you and your health needs are our priority.  As part of our continuing mission to provide you with exceptional heart care, we have created designated Provider Care Teams.  These Care Teams include your primary Cardiologist (physician) and Advanced Practice Providers (APPs -  Physician Assistants and Nurse Practitioners) who all work together to provide you with the care you need, when you need it.  We recommend signing up for the patient portal called "MyChart".  Sign up information is provided on this After Visit Summary.  MyChart is used to connect with patients for Virtual Visits (Telemedicine).  Patients are able to view lab/test results, encounter notes, upcoming appointments, etc.  Non-urgent messages can be sent to your provider as well.   To learn more about what you can do with MyChart, go to [email or web address].    Your next appointment:   6 month(s)  The format for your next appointment:   In Person  Provider:   Nona Dell, MD   Other Instructions None     Thank you for choosing  [facility] !

## 2020-06-02 ENCOUNTER — Encounter (HOSPITAL_COMMUNITY): Payer: Self-pay

## 2020-06-02 ENCOUNTER — Encounter (HOSPITAL_COMMUNITY)
Admission: RE | Admit: 2020-06-02 | Discharge: 2020-06-02 | Disposition: A | Discharge status: Home / Self Care | Payer: Medicare HMO | Source: Ambulatory Visit | Attending: Cardiology | Admitting: Cardiology

## 2020-06-02 ENCOUNTER — Ambulatory Visit (HOSPITAL_COMMUNITY)
Admission: RE | Admit: 2020-06-02 | Discharge: 2020-06-02 | Disposition: A | Discharge status: Home / Self Care | Payer: Medicare HMO | Source: Ambulatory Visit | Attending: Cardiology | Admitting: Cardiology

## 2020-06-02 ENCOUNTER — Telehealth: Payer: Self-pay

## 2020-06-02 ENCOUNTER — Other Ambulatory Visit: Payer: Self-pay

## 2020-06-02 DIAGNOSIS — I25119 Atherosclerotic heart disease of native coronary artery with unspecified angina pectoris: Secondary | ICD-10-CM | POA: Diagnosis not present

## 2020-06-02 DIAGNOSIS — I2510 Atherosclerotic heart disease of native coronary artery without angina pectoris: Secondary | ICD-10-CM

## 2020-06-02 LAB — NM MYOCAR MULTI W/SPECT W/WALL MOTION / EF
LV dias vol: 145 mL (ref 62–150)
LV sys vol: 75 mL
Peak HR: 68 {beats}/min
RATE: 0.44
Rest HR: 38 {beats}/min
SDS: 1
SRS: 6
SSS: 7
TID: 1.11

## 2020-06-02 MED ORDER — TECHNETIUM TC 99M TETROFOSMIN IV KIT
10.0000 | PACK | Freq: Once | INTRAVENOUS | Status: AC | PRN
  Administered 2020-06-02: 10.1 via INTRAVENOUS

## 2020-06-02 MED ORDER — REGADENOSON 0.4 MG/5ML IV SOLN
INTRAVENOUS | Status: AC
  Administered 2020-06-02: 0.4 mg via INTRAVENOUS
  Filled 2020-06-02: qty 5

## 2020-06-02 MED ORDER — TECHNETIUM TC 99M TETROFOSMIN IV KIT
30.0000 | PACK | Freq: Once | INTRAVENOUS | Status: AC | PRN
  Administered 2020-06-02: 30.8 via INTRAVENOUS

## 2020-06-02 MED ORDER — SODIUM CHLORIDE FLUSH 0.9 % IV SOLN
INTRAVENOUS | Status: AC
  Administered 2020-06-02: 10 mL via INTRAVENOUS
  Filled 2020-06-02: qty 10

## 2020-06-02 NOTE — Telephone Encounter (Signed)
-----   Message from Jonelle Sidle, MD sent at 06/02/2020  3:24 PM EDT ----- Results reviewed.  Overall reassuring follow-up stress test with no significant ischemic territories to suggest progressive, obstructive CAD.  Calculated LVEF 48%, visually appears normal however.  I would suggest that we go ahead and get a follow-up echocardiogram to better evaluate.  Otherwise continue with current medications and follow-up plan.

## 2020-06-02 NOTE — Telephone Encounter (Signed)
Nuclear imaging results given to patient, will schedule echo.

## 2020-06-06 ENCOUNTER — Telehealth: Payer: Self-pay | Admitting: Cardiology

## 2020-06-06 DIAGNOSIS — Z8639 Personal history of other endocrine, nutritional and metabolic disease: Secondary | ICD-10-CM

## 2020-06-06 MED ORDER — ROSUVASTATIN CALCIUM 5 MG PO TABS: ORAL_TABLET | ORAL | 6 refills | Status: DC

## 2020-06-06 NOTE — Telephone Encounter (Signed)
Patient called back and stated he does not want to go to lipid clinic just yet. He will continue with crestor.

## 2020-06-06 NOTE — Addendum Note (Signed)
Addended by: Marlyn Corporal A on: 06/06/2020 11:11 AM   Modules accepted: Orders

## 2020-06-06 NOTE — Telephone Encounter (Signed)
New message     Patient called he would like a call back with information on get the injections for his cholesterol since he has issues with statins

## 2020-06-06 NOTE — Telephone Encounter (Signed)
I spoke with patient.He cannot tolerate crestor, wishes to be referred to lipid clinic, ref placed

## 2020-06-09 ENCOUNTER — Other Ambulatory Visit: Payer: Self-pay

## 2020-06-09 ENCOUNTER — Ambulatory Visit (HOSPITAL_COMMUNITY)
Admission: RE | Admit: 2020-06-09 | Discharge: 2020-06-09 | Disposition: A | Discharge status: Home / Self Care | Payer: Medicare HMO | Source: Ambulatory Visit | Attending: Cardiology | Admitting: Cardiology

## 2020-06-09 DIAGNOSIS — I2510 Atherosclerotic heart disease of native coronary artery without angina pectoris: Secondary | ICD-10-CM | POA: Insufficient documentation

## 2020-06-09 LAB — ECHOCARDIOGRAM COMPLETE
AR max vel: 2.76 cm2
AV Area VTI: 2.91 cm2
AV Area mean vel: 2.78 cm2
AV Mean grad: 5.8 mmHg
AV Peak grad: 11.1 mmHg
Ao pk vel: 1.67 m/s
Area-P 1/2: 2.23 cm2
Calc EF: 56.4 %
S' Lateral: 4.54 cm
Single Plane A2C EF: 61.2 %
Single Plane A4C EF: 54.1 %

## 2020-06-09 NOTE — Progress Notes (Signed)
*  PRELIMINARY RESULTS* Echocardiogram 2D Echocardiogram has been performed.  Stacey Drain 06/09/2020, 10:14 AM

## 2020-08-31 ENCOUNTER — Telehealth: Payer: Self-pay | Admitting: Cardiology

## 2020-08-31 NOTE — Telephone Encounter (Signed)
Patient called wants to know if he will need to repeat lab work for Dr Diona Browner since Dr Margo Aye just had him do. Dr hall will be sending the results to [facility].

## 2020-08-31 NOTE — Telephone Encounter (Signed)
Called patient and he verbalized understanding that we still need a Hepatic and Lipid profile from the lab. Will get lab work done 1 week prior to appt with Dr. Diona Browner on 09/23/20.

## 2020-09-09 ENCOUNTER — Telehealth: Payer: Self-pay | Admitting: Orthopedic Surgery

## 2020-09-09 ENCOUNTER — Ambulatory Visit
Admission: EM | Admit: 2020-09-09 | Discharge: 2020-09-09 | Disposition: A | Discharge status: Home / Self Care | Payer: Medicare HMO | Attending: Family Medicine | Admitting: Family Medicine

## 2020-09-09 ENCOUNTER — Ambulatory Visit (INDEPENDENT_AMBULATORY_CARE_PROVIDER_SITE_OTHER): Payer: Medicare HMO

## 2020-09-09 ENCOUNTER — Encounter: Payer: Self-pay | Admitting: Emergency Medicine

## 2020-09-09 DIAGNOSIS — T1490XA Injury, unspecified, initial encounter: Secondary | ICD-10-CM | POA: Diagnosis not present

## 2020-09-09 DIAGNOSIS — M7989 Other specified soft tissue disorders: Secondary | ICD-10-CM

## 2020-09-09 DIAGNOSIS — M25431 Effusion, right wrist: Secondary | ICD-10-CM | POA: Diagnosis not present

## 2020-09-09 DIAGNOSIS — W19XXXA Unspecified fall, initial encounter: Secondary | ICD-10-CM

## 2020-09-09 DIAGNOSIS — M79641 Pain in right hand: Secondary | ICD-10-CM

## 2020-09-09 NOTE — Discharge Instructions (Addendum)
You already have a splint cont to wear will need to follow up with your ortho MD . Use heat every 2 hours as needed for pain  Keep elevated to decrease swelling Take nsaids as needed for pain or tylenol  X ray does not show any new fracture

## 2020-09-09 NOTE — ED Triage Notes (Signed)
Pain and swelling to RT hand.  fell on some concrete from a dog jumping on him on Wed of this week.

## 2020-09-09 NOTE — ED Provider Notes (Signed)
[facility]    CSN: [id number] Arrival date & time: 09/09/20  0949      History   Chief Complaint Chief Complaint  Patient presents with  . Hand Injury    HPI Jeffery Goodwin is a 70 y.o. male.   Rt hand pain and swelling since wed after  We was feeding a dog friend and the dogf jumped hitting his hand. He has had several surgies to this same hand. currently wearing a splint.      Past Medical History:  Diagnosis Date  . Arthritis   . Carpal tunnel syndrome   . Coronary atherosclerosis of native coronary artery    DES mid LCx/OM2 12/2013  . Dry eyes   . GERD (gastroesophageal reflux disease)   . History of shingles   . Hyperlipidemia   . Neck pain   . Nocturia   . Tremor    Right hand    Patient Active Problem List   Diagnosis Date Noted  . Intertrochanteric fracture of left femur (HCC) 09/13/2015  . Intertrochanteric fracture of left hip (HCC)   . Hip fracture (HCC) 09/12/2015  . Spinal stenosis in cervical region 12/20/2014  . Cervical herniated disc 12/20/2014  . History of elevated lipids 05/06/2014  . Elevated blood pressure 12/17/2013  . Abnormal myocardial perfusion study 12/07/2013  . Coronary atherosclerosis of native coronary artery 11/23/2013  . TREMOR, ESSENTIAL, RIGHT HAND 06/02/2009    Past Surgical History:  Procedure Laterality Date  . ANTERIOR CERVICAL DECOMP/DISCECTOMY FUSION N/A 12/20/2014   Procedure: ANTERIOR CERVICAL DECOMPRESSION/DISCECTOMY FUSION 3 LEVELS;  Surgeon: [name], MD;  [facility]: [facility];  Service: Neurosurgery;  Laterality: N/A;  ANTERIOR CERVICAL DECOMPRESSION/DISCECTOMY FUSION 3 LEVELS C3-4,4-5,5-6  . Arthroscopic left knee surgery  2005/2013  . Carpal tunnel repair Right 1998  . COLONOSCOPY     x2  . COLONOSCOPY WITH PROPOFOL N/A 09/09/2017   Procedure: COLONOSCOPY WITH PROPOFOL;  Surgeon: [name], MD;  [facility]: [facility];  Service: Endoscopy;  Laterality: N/A;  8:30am  . INTRAMEDULLARY  (IM) NAIL INTERTROCHANTERIC Left 09/13/2015   Procedure: INTRAMEDULLARY  NAIL INTERTROCHANTRIC;  Surgeon: [name], MD;  Location: [facility];  Service: Orthopedics;  Laterality: Left;  . LEFT HEART CATHETERIZATION WITH CORONARY ANGIOGRAM N/A 12/11/2013   Procedure: LEFT HEART CATHETERIZATION WITH CORONARY ANGIOGRAM;  Surgeon: [name], MD;  Location: [facility] CATH LAB;  Service: Cardiovascular;  Laterality: N/A;  . Left inguinal herniorrhaphy  2008       Home Medications    Prior to Admission medications   Medication Sig Start Date End Date Taking? Authorizing Provider  aspirin EC 81 MG tablet Take 1 tablet (81 mg total) by mouth daily. Swallow whole. 03/17/20   Strader, Lennart Pall, PA-C  cholecalciferol (VITAMIN D) 1000 units tablet Take 2,000 Units daily by mouth.     [provider]  Cyanocobalamin (B-12) 3000 MCG CAPS Take 1 capsule by mouth daily.    [provider]  ezetimibe (ZETIA) 10 MG tablet Take 1 tablet (10 mg total) by mouth daily. 10/13/19 05/25/20  Jonelle Sidle, MD  Misc Natural Products (GLUCOS-CHONDROIT-MSM COMPLEX PO) Take 3 tablets by mouth every morning.     [provider]  Omega-3 Fatty Acids (FISH OIL) 1000 MG CPDR Take 4,000 mg by mouth daily.     [provider]  Probiotic Product (PROBIOTIC-10 ULTIMATE PO) Take by mouth.    [provider]  propranolol (INDERAL) 10 MG tablet  Take 5 mg by mouth daily.     [provider]  rosuvastatin (CRESTOR) 5 MG tablet Take 5 mg weekly for 3 weeks, if tolerated, increase to twice a week (Monday,Friday) 06/06/20   Satira Sark, MD  Saw Palmetto 450 MG CAPS Take 450 mg by mouth daily.    [provider]  vitamin C (ASCORBIC ACID) 500 MG tablet Take 500 mg by mouth daily.    [provider]    Family History Family History  Problem Relation Age of Onset  . Colon cancer Mother   . Brain cancer Father     Social History Social History    Tobacco Use  . Smoking status: Former Smoker    Packs/day: 2.00    Years: 37.00    Pack years: 74.00    Types: Cigarettes    Start date: 09/03/1972    Quit date: 09/03/2008    Years since quitting: 12.0  . Smokeless tobacco: Never Used  . Tobacco comment: quit smoking 6 yrs ago.   Vaping Use  . Vaping Use: Never used  Substance Use Topics  . Alcohol use: Yes    Alcohol/week: 2.0 standard drinks    Types: 2 Cans of beer per week    Comment: 2 beers a day  . Drug use: No     Allergies   Asa [aspirin]   Review of Systems Review of Systems  Constitutional: Negative.   Respiratory: Negative.   Cardiovascular: Negative.   Musculoskeletal: Positive for joint swelling.  Skin:       Swelling to rt hand   Neurological: Negative.      Physical Exam Triage Vital Signs ED Triage Vitals  Enc Vitals Group     BP 09/09/20 0958 (!) 148/88     Pulse Rate 09/09/20 0958 (!) 54     Resp 09/09/20 0958 19     Temp 09/09/20 0958 98.6 F (37 C)     Temp Source 09/09/20 0958 Oral     SpO2 09/09/20 0958 99 %     Weight --      Height --      Head Circumference --      Peak Flow --      Pain Score 09/09/20 0957 5     Pain Loc --      Pain Edu? --      Excl. in [place]? --    No data found.  Updated Vital Signs BP (!) 148/88 (BP Location: Right Arm)   Pulse (!) 54   Temp 98.6 F (37 C) (Oral)   Resp 19   SpO2 99%   Visual Acuity     Physical Exam Cardiovascular:     Rate and Rhythm: Normal rate.     Pulses: Normal pulses.  Skin:    General: Skin is warm.     Capillary Refill: Capillary refill takes less than 2 seconds.     Findings: Erythema present.     Comments: +2 edema to wrist area, decrease ROM, digits warm,   Neurological:     General: No focal deficit present.     Mental Status: He is alert.      UC Treatments / Results  Labs (all labs ordered are listed, but only abnormal results are displayed) Labs Reviewed - No data to  display  EKG   Radiology DG Hand Complete Right  Result Date: 09/09/2020 CLINICAL DATA:  Right hand and wrist swelling after fall 2 days ago. EXAM: RIGHT  HAND - COMPLETE 3+ VIEW COMPARISON:  Prior right wrist films on 12/11/2012 FINDINGS: No acute fracture or dislocation identified. Progressive degenerative disease noted involving the carpal bones. There may be an old injury involving the trapezium. Significant degenerative disease is seen involving the first carpometacarpal joint. No bony lesions or destruction. Vascular calcifications at the level of the radial artery. IMPRESSION: No acute fracture identified. 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 Electronically Signed   By: Irish Lack M.D.   On: 09/09/2020 10:28    Procedures Procedures (including critical care time)  Medications Ordered in UC Medications - No data to display  Initial Impression / Assessment and Plan / UC Course  I have reviewed the triage vital signs and the nursing notes.  Pertinent labs & imaging results that were available during my care of the patient were reviewed by me and considered in my medical decision making (see chart for details).     You already have a splint cont to wear will need to follow up with your ortho MD . Use heat every 2 hours as needed for pain  Keep elevated to decrease swelling   Final Clinical Impressions(s) / UC Diagnoses   Final diagnoses:  Swelling of right hand  Injury     Discharge Instructions     You already have a splint cont to wear will need to follow up with your ortho MD . Use heat every 2 hours as needed for pain  Keep elevated to decrease swelling Take nsaids as needed for pain or tylenol  X ray does not show any new fracture     ED Prescriptions    None     PDMP not reviewed this encounter.   Coralyn Mark, NP 09/09/20 1040

## 2020-09-09 NOTE — Telephone Encounter (Signed)
Call received from patient received, relaying that he "may have broken" his hand; said injury occurred Wednesday, 09/07/20, while walking his dog. Relayed that we have no immediate openings, as our office is open just for morning hours today. [place] Spaniel already spoke with his primary care. Due to urgent nature, and weekend, discussed options of urgent care, emergency room, or  [facility]. Will let us know if needs appointment.

## 2020-09-17 LAB — LIPID PANEL
Cholesterol: 168 mg/dL (ref <200)
HDL: 58 mg/dL (ref >=40)
LDL Cholesterol (Calc): 95 mg/dL (calc)
Non-HDL Cholesterol (Calc): 110 mg/dL (calc) (ref <130)
Total CHOL/HDL Ratio: 2.9 (calc) (ref <5.0)
Triglycerides: 63 mg/dL (ref <150)

## 2020-09-17 LAB — HEPATIC FUNCTION PANEL
AG Ratio: 1.9 (calc) (ref 1.0–2.5)
ALT: 17 U/L (ref 9–46)
AST: 21 U/L (ref 10–35)
Albumin: 4.2 g/dL (ref 3.6–5.1)
Alkaline phosphatase (APISO): 70 U/L (ref 35–144)
Bilirubin, Direct: 0.2 mg/dL (ref 0.0–0.2)
Globulin: 2.2 g/dL (calc) (ref 1.9–3.7)
Indirect Bilirubin: 0.5 mg/dL (calc) (ref 0.2–1.2)
Total Bilirubin: 0.7 mg/dL (ref 0.2–1.2)
Total Protein: 6.4 g/dL (ref 6.1–8.1)

## 2020-09-20 ENCOUNTER — Telehealth: Payer: Self-pay | Admitting: Cardiology

## 2020-09-20 NOTE — Telephone Encounter (Signed)
Patient was returning call to [facility], he already received his blood work results if this is something different please call him back

## 2020-09-23 ENCOUNTER — Ambulatory Visit: Payer: Medicare HMO | Admitting: Cardiology

## 2020-09-23 ENCOUNTER — Encounter: Payer: Self-pay | Admitting: Cardiology

## 2020-09-23 ENCOUNTER — Other Ambulatory Visit: Payer: Self-pay

## 2020-09-23 VITALS — BP 140/80 | HR 60 | Ht 73.0 in | Wt 212.0 lb

## 2020-09-23 DIAGNOSIS — I25119 Atherosclerotic heart disease of native coronary artery with unspecified angina pectoris: Secondary | ICD-10-CM

## 2020-09-23 DIAGNOSIS — E782 Mixed hyperlipidemia: Secondary | ICD-10-CM

## 2020-09-23 NOTE — Patient Instructions (Signed)
Medication Instructions:  Your physician recommends that you continue on your current medications as directed. Please refer to the Current Medication list given to you today.  *If you need a refill on your cardiac medications before your next appointment, please call your pharmacy*   Lab Work: Fasting lipids in 6 months before next visit  If you have labs (blood work) drawn today and your tests are completely normal, you will receive your results only by: Marland Kitchen MyChart Message (if you have MyChart) OR . A paper copy in the mail If you have any lab test that is abnormal or we need to change your treatment, we will call you to review the results.   Testing/Procedures:    Follow-Up: At [facility], you and your health needs are our priority.  As part of our continuing mission to provide you with exceptional heart care, we have created designated Provider Care Teams.  These Care Teams include your primary Cardiologist (physician) and Advanced Practice Providers (APPs -  Physician Assistants and Nurse Practitioners) who all work together to provide you with the care you need, when you need it.  We recommend signing up for the patient portal called "MyChart".  Sign up information is provided on this After Visit Summary.  MyChart is used to connect with patients for Virtual Visits (Telemedicine).  Patients are able to view lab/test results, encounter notes, upcoming appointments, etc.  Non-urgent messages can be sent to your provider as well.   To learn more about what you can do with MyChart, go to [email or web address].    Your next appointment:   6 month(s)  The format for your next appointment:   In Person  Provider:   Nona Dell, MD   Other Instructions None      Thank you for choosing [facility] !

## 2020-09-23 NOTE — Progress Notes (Signed)
Cardiology Office Note  Date: 09/23/2020   ID: Jeffery Goodwin, Jeffery Goodwin 12, 1952, MRN [id number]  PCP:  Jeffery Stabile, MD  Cardiologist:  Jeffery Dell, MD Electrophysiologist:  None   Chief Complaint  Patient presents with  . Cardiac follow-up    History of Present Illness: Jeffery Goodwin is a 70 y.o. male last seen in September 2021.  He presents for a routine visit.  Continues to do well without active angina on medical therapy, still exercising regularly with good stamina.  Recent LDL 95, we have discussed potential treatment options.  He did not tolerate Crestor even at 5 mg once a week, had ankle and hip pain which resolved when he stopped the medicine.  Follow-up Myoview in September 2021 was low risk as outlined below. LVEF normal by confirmatory echocardiogram in 55 to 60% range, mild mitral regurgitation and severely dilated left atrium, otherwise no significant valvular abnormalities.  I reviewed his medications which are outlined below.  Past Medical History:  Diagnosis Date  . Arthritis   . Carpal tunnel syndrome   . Coronary atherosclerosis of native coronary artery    DES mid LCx/OM2 12/2013  . Dry eyes   . GERD (gastroesophageal reflux disease)   . History of shingles   . Hyperlipidemia   . Neck pain   . Nocturia   . Tremor    Right hand    Past Surgical History:  Procedure Laterality Date  . ANTERIOR CERVICAL DECOMP/DISCECTOMY FUSION N/A 12/20/2014   Procedure: ANTERIOR CERVICAL DECOMPRESSION/DISCECTOMY FUSION 3 LEVELS;  Surgeon: [name], MD;  Location: [facility];  Service: Neurosurgery;  Laterality: N/A;  ANTERIOR CERVICAL DECOMPRESSION/DISCECTOMY FUSION 3 LEVELS C3-4,4-5,5-6  . Arthroscopic left knee surgery  2005/2013  . Carpal tunnel repair Right 1998  . COLONOSCOPY     x2  . COLONOSCOPY WITH PROPOFOL N/A 09/09/2017   Procedure: COLONOSCOPY WITH PROPOFOL;  Surgeon: [name], MD;  [facility];  Service: Endoscopy;  Laterality:  N/A;  8:30am  . INTRAMEDULLARY (IM) NAIL INTERTROCHANTERIC Left 09/13/2015   Procedure: INTRAMEDULLARY  NAIL INTERTROCHANTRIC;  Surgeon: [name], MD;  Location: [facility];  Service: Orthopedics;  Laterality: Left;  . LEFT HEART CATHETERIZATION WITH CORONARY ANGIOGRAM N/A 12/11/2013   Procedure: LEFT HEART CATHETERIZATION WITH CORONARY ANGIOGRAM;  Surgeon: [name], MD;  Location[facility] CATH LAB;  Service: Cardiovascular;  Laterality: N/A;  . Left inguinal herniorrhaphy  2008    Current Outpatient Medications  Medication Sig Dispense Refill  . aspirin EC 81 MG tablet Take 1 tablet (81 mg total) by mouth daily. Swallow whole. 90 tablet 3  . cholecalciferol (VITAMIN D) 1000 units tablet Take 2,000 Units daily by mouth.     . Cyanocobalamin (B-12) 3000 MCG CAPS Take 1 capsule by mouth daily.    Marland Kitchen ezetimibe (ZETIA) 10 MG tablet Take 1 tablet (10 mg total) by mouth daily. 90 tablet 3  . Misc Natural Products (GLUCOS-CHONDROIT-MSM COMPLEX PO) Take 3 tablets by mouth every morning.     . Omega-3 Fatty Acids (FISH OIL) 1000 MG CPDR Take 4,000 mg by mouth daily.     . Probiotic Product (PROBIOTIC-10 ULTIMATE PO) Take by mouth.    . propranolol (INDERAL) 10 MG tablet Take 5 mg by mouth daily.     . Saw Palmetto 450 MG CAPS Take 450 mg by mouth daily.    . vitamin C (ASCORBIC ACID) 500 MG tablet Take 500 mg by mouth daily.  No current facility-administered medications for this visit.   Allergies:  Asa [aspirin]   ROS: No palpitations or syncope.  Physical Exam: VS:  BP 140/80   Pulse 60   Ht 6\' 1"  (1.854 m)   Wt 212 lb (96.2 kg)   SpO2 100%   BMI 27.97 kg/m , BMI Body mass index is 27.97 kg/m.  Wt Readings from Last 3 Encounters:  09/23/20 212 lb (96.2 kg)  05/25/20 212 lb (96.2 kg)  11/05/19 213 lb (96.6 kg)    General: Patient appears comfortable at rest. HEENT: Conjunctiva and lids normal, wearing a mask. Neck: Supple, no elevated JVP or carotid bruits, no  thyromegaly. Lungs: Clear to auscultation, nonlabored breathing at rest. Cardiac: Regular rate and rhythm, no S3 or significant systolic murmur, no pericardial rub. Extremities: No pitting edema.  ECG:  An ECG dated 11/04/2019 was personally reviewed today and demonstrated:  Sinus bradycardia with nonspecific ST-T changes.  Recent Labwork: 09/16/2020: ALT 17; AST 21     Component Value Date/Time   CHOL 168 09/16/2020 1055   TRIG 63 09/16/2020 1055   HDL 58 09/16/2020 1055   CHOLHDL 2.9 09/16/2020 1055   VLDL 7 05/18/2020 1255   LDLCALC 95 09/16/2020 1055    Other Studies Reviewed Today:  Lexiscan Myoview 06/02/2020:  No diagnostic ST segment changes to indicate ischemia.  Small, mild intensity, mid to apical inferior/inferoseptal defect that is fixed and most consistent with soft tissue attenuation. No large ischemic territories.  This is a low risk study.  Nuclear stress EF: 48%. Visually ejection fraction appears normal, would suggest confirmation with echocardiography.  Assessment and Plan:  1. CAD status post DES to the circumflex/OM in 2015.  Recent follow-up Myoview in September 2021 was low risk, no significant ischemic territories.  Concurrent echocardiogram revealed normal LVEF at 55 to 60%.  Plan to continue medical therapy and observation.  He is on aspirin, Zetia, propranolol, and omega-3 supplements.  2. Mixed hyperlipidemia with significant statin intolerance.  We have discussed the situation today.  He is going to focus on more dietary restrictions with goal of weight loss, continue to exercise.  If no significant improvement in lipids in the next 6 months, will consider Repatha at that time.  Medication Adjustments/Labs and Tests Ordered: Current medicines are reviewed at length with the patient today.  Concerns regarding medicines are outlined above.   Tests Ordered: Orders Placed This Encounter  Procedures  . Lipid Profile    Medication Changes: No orders  of the defined types were placed in this encounter.   Disposition:  Follow up 6 months in the [place] office.  Signed, [place], MD, [facility] 09/23/2020 2:24 PM    [facility] at [facility] [address] [place], [place] Phone: [phone]; Fax: [phone]

## 2021-03-15 ENCOUNTER — Telehealth: Payer: Self-pay | Admitting: Cardiology

## 2021-03-15 NOTE — Telephone Encounter (Signed)
Pt contacted and aware lipid panel needs to be drawn before his appt on 7/21. Pt to come to [place] office and pick up lab slips.

## 2021-03-15 NOTE — Telephone Encounter (Signed)
Patient called stating that he has upcoming appointment with Dr. Diona Browner. He is wanting to know if he is suppose to have blood work before his office visit.  [phone].

## 2021-03-17 ENCOUNTER — Telehealth: Payer: Self-pay

## 2021-03-17 LAB — LIPID PANEL
Cholesterol: 144 mg/dL (ref <200)
HDL: 49 mg/dL (ref >=40)
LDL Cholesterol (Calc): 81 mg/dL (calc)
Non-HDL Cholesterol (Calc): 95 mg/dL (calc) (ref <130)
Total CHOL/HDL Ratio: 2.9 (calc) (ref <5.0)
Triglycerides: 60 mg/dL (ref <150)

## 2021-03-17 NOTE — Telephone Encounter (Signed)
Pt notified and verbalized understanding. Pt had no questions or concerns at this time. A copy was sent to PCP

## 2021-03-17 NOTE — Telephone Encounter (Signed)
-----   Message from Jonelle Sidle, MD sent at 03/17/2021  7:58 AM EDT ----- Results reviewed.  Lipids look better, total cholesterol down to 144 and LDL down to 81.  Nice work.

## 2021-03-23 ENCOUNTER — Other Ambulatory Visit: Payer: Self-pay

## 2021-03-23 ENCOUNTER — Encounter: Payer: Self-pay | Admitting: Cardiology

## 2021-03-23 ENCOUNTER — Ambulatory Visit: Payer: Medicare HMO | Admitting: Cardiology

## 2021-03-23 VITALS — BP 136/76 | HR 62 | Ht 73.0 in | Wt 215.0 lb

## 2021-03-23 DIAGNOSIS — E782 Mixed hyperlipidemia: Secondary | ICD-10-CM | POA: Diagnosis not present

## 2021-03-23 DIAGNOSIS — I25119 Atherosclerotic heart disease of native coronary artery with unspecified angina pectoris: Secondary | ICD-10-CM | POA: Diagnosis not present

## 2021-03-23 NOTE — Progress Notes (Signed)
Cardiology Office Note  Date: 03/23/2021   ID: Jeffery, Goodwin 10/12/1950, MRN [id number]  PCP:  Benita Stabile, MD  Cardiologist:  Nona Dell, MD Electrophysiologist:  None   Chief Complaint  Patient presents with   Cardiac follow-up    History of Present Illness: Jeffery Goodwin is a 70 y.o. male last seen in January.  He is here for a routine visit.  He does not describe any angina symptoms.  Continues to exercise 7 days a week alternating between swimming and then doing spinning, weights, and yoga stretches.  Recent follow-up lipid panel showed nice improvement in LDL from 95 down to 81.  He has been able to tolerate Crestor at 2.5 mg once a week, also remains on Zetia and omega-3 supplements.  I personally reviewed his ECG today which shows sinus bradycardia with PAC, nonspecific ST changes.  Past Medical History:  Diagnosis Date   Arthritis    Carpal tunnel syndrome    Coronary atherosclerosis of native coronary artery    DES mid LCx/OM2 12/2013   Dry eyes    GERD (gastroesophageal reflux disease)    History of shingles    Hyperlipidemia    Neck pain    Nocturia    Tremor    Right hand    Past Surgical History:  Procedure Laterality Date   ANTERIOR CERVICAL DECOMP/DISCECTOMY FUSION N/A 12/20/2014   Procedure: ANTERIOR CERVICAL DECOMPRESSION/DISCECTOMY FUSION 3 LEVELS;  Surgeon: [name], MD;  Location: [facility];  Service: Neurosurgery;  Laterality: N/A;  ANTERIOR CERVICAL DECOMPRESSION/DISCECTOMY FUSION 3 LEVELS C3-4,4-5,5-6   Arthroscopic left knee surgery  2005/2013   Carpal tunnel repair Right 1998   COLONOSCOPY     x2   COLONOSCOPY WITH PROPOFOL N/A 09/09/2017   Procedure: COLONOSCOPY WITH PROPOFOL;  Surgeon: [name], MD;  [facility];  Service: Endoscopy;  Laterality: N/A;  8:30am   INTRAMEDULLARY (IM) NAIL INTERTROCHANTERIC Left 09/13/2015   Procedure: INTRAMEDULLARY  NAIL INTERTROCHANTRIC;  Surgeon: [name], MD;   Location: [facility];  Service: Orthopedics;  Laterality: Left;   LEFT HEART CATHETERIZATION WITH CORONARY ANGIOGRAM N/A 12/11/2013   Procedure: LEFT HEART CATHETERIZATION WITH CORONARY ANGIOGRAM;  Surgeon: [name], MD;  Location: [facility] CATH LAB;  Service: Cardiovascular;  Laterality: N/A;   Left inguinal herniorrhaphy  2008    Current Outpatient Medications  Medication Sig Dispense Refill   aspirin EC 81 MG tablet Take 1 tablet (81 mg total) by mouth daily. Swallow whole. 90 tablet 3   cholecalciferol (VITAMIN D) 1000 units tablet Take 2,000 Units daily by mouth.      ezetimibe (ZETIA) 10 MG tablet Take 1 tablet (10 mg total) by mouth daily. 90 tablet 3   Misc Natural Products (GLUCOS-CHONDROIT-MSM COMPLEX PO) Take 3 tablets by mouth every morning.      Omega-3 Fatty Acids (FISH OIL) 1000 MG CPDR Take 4,000 mg by mouth daily.      Probiotic Product (PROBIOTIC-10 ULTIMATE PO) Take by mouth.     propranolol (INDERAL) 10 MG tablet Take 10 mg by mouth daily.     rosuvastatin (CRESTOR) 5 MG tablet Take 5 mg by mouth daily. takes 2.5 mg weekly     Saw Palmetto 450 MG CAPS Take 450 mg by mouth daily.     vitamin C (ASCORBIC ACID) 500 MG tablet Take 500 mg by mouth daily.     Cyanocobalamin (B-12) 3000 MCG CAPS Take 1 capsule by mouth daily. (  Patient not taking: Reported on 03/23/2021)     No current facility-administered medications for this visit.   Allergies:  Asa [aspirin]   ROS: No palpitations or syncope.  Physical Exam: VS:  BP 136/76   Pulse 62   Ht 6\' 1"  (1.854 m)   Wt 215 lb (97.5 kg)   SpO2 97%   BMI 28.37 kg/m , BMI Body mass index is 28.37 kg/m.  Wt Readings from Last 3 Encounters:  03/23/21 215 lb (97.5 kg)  09/23/20 212 lb (96.2 kg)  05/25/20 212 lb (96.2 kg)    General: Patient appears comfortable at rest. HEENT: Conjunctiva and lids normal, wearing a mask. Neck: Supple, no elevated JVP or carotid bruits, no thyromegaly. Lungs: Clear to auscultation, nonlabored  breathing at rest. Cardiac: Regular rate and rhythm, no S3 or significant systolic murmur, no pericardial rub. Extremities: No pitting edema.  ECG:  An ECG dated 11/04/2019 was personally reviewed today and demonstrated:  Sinus bradycardia with nonspecific ST-T changes.  Recent Labwork: 09/16/2020: ALT 17; AST 21     Component Value Date/Time   CHOL 144 03/16/2021 0828   TRIG 60 03/16/2021 0828   HDL 49 03/16/2021 0828   CHOLHDL 2.9 03/16/2021 0828   VLDL 7 05/18/2020 1255   LDLCALC 81 03/16/2021 0828    Other Studies Reviewed Today:  Lexiscan Myoview 06/02/2020: No diagnostic ST segment changes to indicate ischemia. Small, mild intensity, mid to apical inferior/inferoseptal defect that is fixed and most consistent with soft tissue attenuation. No large ischemic territories. This is a low risk study. Nuclear stress EF: 48%. Visually ejection fraction appears normal, would suggest confirmation with echocardiography.  Assessment and Plan:  1.  CAD status post DES to the circumflex/OM in 2015.  Plan is to continue observation on medical therapy in the absence of angina symptoms.  Follow-up Myoview last year was low risk.  I reviewed his ECG.  Continue aspirin, Zetia, Crestor, Inderal, and as needed nitroglycerin.  2.  Mixed hyperlipidemia with statin intolerance.  At this point he is doing well on very low-dose Crestor along with Zetia with LDL down to 81.  Medication Adjustments/Labs and Tests Ordered: Current medicines are reviewed at length with the patient today.  Concerns regarding medicines are outlined above.   Tests Ordered: Orders Placed This Encounter  Procedures   EKG 12-Lead    Medication Changes: No orders of the defined types were placed in this encounter.   Disposition:  Follow up  6 months.  Signed, 2016, MD, [facility] 03/23/2021 2:21 PM    [facility] at [facility] [address] [place], [place] Phone: [phone]; Fax: [phone]

## 2021-03-23 NOTE — Patient Instructions (Signed)
Medication Instructions:  Your physician recommends that you continue on your current medications as directed. Please refer to the Current Medication list given to you today.    Let us know what your dose of Rosuvastatin is.  *If you need a refill on your cardiac medications before your next appointment, please call your pharmacy*   Lab Work: None today  If you have labs (blood work) drawn today and your tests are completely normal, you will receive your results only by: MyChart Message (if you have MyChart) OR A paper copy in the mail If you have any lab test that is abnormal or we need to change your treatment, we will call you to review the results.   Testing/Procedures: None today    Follow-Up: At [facility], you and your health needs are our priority.  As part of our continuing mission to provide you with exceptional heart care, we have created designated Provider Care Teams.  These Care Teams include your primary Cardiologist (physician) and Advanced Practice Providers (APPs -  Physician Assistants and Nurse Practitioners) who all work together to provide you with the care you need, when you need it.  We recommend signing up for the patient portal called "MyChart".  Sign up information is provided on this After Visit Summary.  MyChart is used to connect with patients for Virtual Visits (Telemedicine).  Patients are able to view lab/test results, encounter notes, upcoming appointments, etc.  Non-urgent messages can be sent to your provider as well.   To learn more about what you can do with MyChart, go to [email or web address].    Your next appointment:   6 month(s)  The format for your next appointment:   In Person  Provider:   Nona Dell, MD   Other Instructions None

## 2021-04-07 ENCOUNTER — Telehealth: Payer: Self-pay

## 2021-04-07 MED ORDER — ROSUVASTATIN CALCIUM 5 MG PO TABS
5.0000 mg | ORAL_TABLET | Freq: Every day | ORAL | 3 refills | Status: DC
Start: 2021-04-07 — End: 2022-03-05

## 2021-04-07 NOTE — Telephone Encounter (Signed)
New message     *STAT* If patient is at the pharmacy, call can be transferred to refill team.   1. Which medications need to be refilled? (please list name of each medication and dose if known) rosuvastatin (CRESTOR) 5 MG tablet  2. Which pharmacy/location (including street and city if local pharmacy) is medication to be sent to? [facility] [address]   3. Do they need a 30 day or 90 day supply? 90

## 2021-04-07 NOTE — Telephone Encounter (Signed)
Refilled as requested  

## 2021-08-17 ENCOUNTER — Telehealth: Payer: Self-pay | Admitting: Cardiology

## 2021-08-17 DIAGNOSIS — E782 Mixed hyperlipidemia: Secondary | ICD-10-CM

## 2021-08-17 NOTE — Telephone Encounter (Signed)
Pt is requesting an order for blood work  to be entered at least 2 weeks before the f/u appt with Diona Browner on 09/05/20

## 2021-08-18 ENCOUNTER — Other Ambulatory Visit: Payer: Self-pay | Admitting: *Deleted

## 2021-08-18 DIAGNOSIS — E782 Mixed hyperlipidemia: Secondary | ICD-10-CM

## 2021-08-18 NOTE — Telephone Encounter (Signed)
Patient informed and verbalized understanding of plan. Lab order faxed to [facility] 

## 2021-09-05 ENCOUNTER — Encounter: Payer: Self-pay | Admitting: Cardiology

## 2021-09-05 ENCOUNTER — Ambulatory Visit: Payer: Medicare HMO | Admitting: Cardiology

## 2021-09-05 VITALS — BP 124/74 | HR 58 | Wt 218.0 lb

## 2021-09-05 DIAGNOSIS — E782 Mixed hyperlipidemia: Secondary | ICD-10-CM | POA: Diagnosis not present

## 2021-09-05 DIAGNOSIS — I25119 Atherosclerotic heart disease of native coronary artery with unspecified angina pectoris: Secondary | ICD-10-CM | POA: Diagnosis not present

## 2021-09-05 MED ORDER — FISH OIL 1000 MG PO CPDR: 2000.0000 mg | DELAYED_RELEASE_CAPSULE | Freq: Every day | ORAL | Status: AC

## 2021-09-05 NOTE — Patient Instructions (Addendum)
Medication Instructions:  Decrease your Omega 3 to 1/2 the dose Continue all other medications.     Labwork: none  Testing/Procedures: FLP - order given today Reminder:  Nothing to eat or drink after 12 midnight prior to labs. Please do just prior to next office visit   Follow-Up: 6 months   Any Other Special Instructions Will Be Listed Below (If Applicable).   If you need a refill on your cardiac medications before your next appointment, please call your pharmacy.

## 2021-09-05 NOTE — Progress Notes (Signed)
Cardiology Office Note  Date: 09/05/2021   ID: Lundy, Jeffery December 28, 1950, MRN [id number]  PCP:  Benita Stabile, MD  Cardiologist:  Nona Dell, MD Electrophysiologist:  None   Chief Complaint  Patient presents with   Cardiac follow-up    History of Present Illness: Jeffery Goodwin is a 71 y.o. male last seen in July 2022.  He is here for a follow-up visit.  He does not describe any change in stamina, in fact has been feeling somewhat better with his regular exercise plan.  Still swimming, also paddling, working with weights and has had improvement in knee pain and strength.  He does recall a few episodes of a brief fluttering in his chest, this has not progressed, no prolonged events or syncope.  Also one episode of potential angina when he was swimming.  Again, this has not been progressive.  Last ischemic work-up was via Myoview in September 2021 which was low risk.  He continues on a stable cardiac regimen.  We went over his medications, discussed reducing his omega-3 supplements, he also plans to increase his Crestor to twice weekly since he has been tolerating it.  His last LDL was 81 in July 2022, we are in the process of getting his lab work from December 2022.  Past Medical History:  Diagnosis Date   Arthritis    Carpal tunnel syndrome    Coronary atherosclerosis of native coronary artery    DES mid LCx/OM2 12/2013   Dry eyes    GERD (gastroesophageal reflux disease)    History of shingles    Hyperlipidemia    Neck pain    Nocturia    Tremor    Right hand    Past Surgical History:  Procedure Laterality Date   ANTERIOR CERVICAL DECOMP/DISCECTOMY FUSION N/A 12/20/2014   Procedure: ANTERIOR CERVICAL DECOMPRESSION/DISCECTOMY FUSION 3 LEVELS;  Surgeon: [name], MD;  Location: [facility];  Service: Neurosurgery;  Laterality: N/A;  ANTERIOR CERVICAL DECOMPRESSION/DISCECTOMY FUSION 3 LEVELS C3-4,4-5,5-6   Arthroscopic left knee surgery  2005/2013   Carpal tunnel  repair Right 1998   COLONOSCOPY     x2   COLONOSCOPY WITH PROPOFOL N/A 09/09/2017   Procedure: COLONOSCOPY WITH PROPOFOL;  Surgeon: [name], MD;  Location: [facility];  Service: Endoscopy;  Laterality: N/A;  8:30am   INTRAMEDULLARY (IM) NAIL INTERTROCHANTERIC Left 09/13/2015   Procedure: INTRAMEDULLARY  NAIL INTERTROCHANTRIC;  Surgeon: [name], MD;  Location: [facility] ORS;  Service: Orthopedics;  Laterality: Left;   LEFT HEART CATHETERIZATION WITH CORONARY ANGIOGRAM N/A 12/11/2013   Procedure: LEFT HEART CATHETERIZATION WITH CORONARY ANGIOGRAM;  Surgeon: [name], MD;  Location: [facility] CATH LAB;  Service: Cardiovascular;  Laterality: N/A;   Left inguinal herniorrhaphy  2008    Current Outpatient Medications  Medication Sig Dispense Refill   aspirin EC 81 MG tablet Take 1 tablet (81 mg total) by mouth daily. Swallow whole. 90 tablet 3   cholecalciferol (VITAMIN D) 1000 units tablet Take 2,000 Units daily by mouth.      ezetimibe (ZETIA) 10 MG tablet Take 1 tablet (10 mg total) by mouth daily. 90 tablet 3   Misc Natural Products (GLUCOS-CHONDROIT-MSM COMPLEX PO) Take 3 tablets by mouth every morning.      Probiotic Product (PROBIOTIC-10 ULTIMATE PO) Take by mouth.     propranolol (INDERAL) 10 MG tablet Take 10 mg by mouth daily.     rosuvastatin (CRESTOR) 5 MG tablet Take 1 tablet (5  mg total) by mouth daily. takes 2.5 mg weekly 90 tablet 3   Saw Palmetto 450 MG CAPS Take 450 mg by mouth daily.     vitamin C (ASCORBIC ACID) 500 MG tablet Take 500 mg by mouth daily.     Cyanocobalamin (B-12) 3000 MCG CAPS Take 1 capsule by mouth daily. (Patient not taking: Reported on 03/23/2021)     Omega-3 Fatty Acids (FISH OIL) 1000 MG CPDR Take 2,000 mg by mouth daily.     No current facility-administered medications for this visit.   Allergies:  Asa [aspirin]   ROS: No syncope.  Stable arthritic symptoms.  Physical Exam: VS:  BP 124/74    Pulse (!) 58    Wt 218 lb (98.9 kg)    SpO2 97%     BMI 28.76 kg/m , BMI Body mass index is 28.76 kg/m.  Wt Readings from Last 3 Encounters:  09/05/21 218 lb (98.9 kg)  03/23/21 215 lb (97.5 kg)  09/23/20 212 lb (96.2 kg)    General: Patient appears comfortable at rest. HEENT: Conjunctiva and lids normal, wearing mask. Neck: Supple, no elevated JVP or carotid bruits, no thyromegaly. Lungs: Clear to auscultation, nonlabored breathing at rest. Cardiac: Regular rate and rhythm, no S3 or significant systolic murmur, no pericardial rub. Extremities: No pitting edema.  ECG:  An ECG dated 03/23/2021 was personally reviewed today and demonstrated:  Sinus bradycardia with PAC, nonspecific ST changes.  Recent Labwork: 09/16/2020: ALT 17; AST 21     Component Value Date/Time   CHOL 144 03/16/2021 0828   TRIG 60 03/16/2021 0828   HDL 49 03/16/2021 0828   CHOLHDL 2.9 03/16/2021 0828   VLDL 7 05/18/2020 1255   LDLCALC 81 03/16/2021 0828    Other Studies Reviewed Today:  Lexiscan Myoview 06/02/2020: No diagnostic ST segment changes to indicate ischemia. Small, mild intensity, mid to apical inferior/inferoseptal defect that is fixed and most consistent with soft tissue attenuation. No large ischemic territories. This is a low risk study. Nuclear stress EF: 48%. Visually ejection fraction appears normal, would suggest confirmation with echocardiography.  Assessment and Plan:  1.  CAD status post DES to circumflex/OM in 2015.  He underwent a follow-up Lexiscan Myoview in September 2021 that was low risk.  Discussed being observant for any increasing angina symptoms or sense of palpitations that would warrant further evaluation.  Continue with stable exercise plan and medications.  He is on aspirin, Zetia, Inderal, and Crestor.  2.  Mixed hyperlipidemia with statin intolerance/myalgias.  Increase Crestor 2.5 mg twice weekly and continue Zetia.  Recheck FLP in 6 months.  We have discussed role of PCSK9 inhibitors as well.  Medication  Adjustments/Labs and Tests Ordered: Current medicines are reviewed at length with the patient today.  Concerns regarding medicines are outlined above.   Tests Ordered: Orders Placed This Encounter  Procedures   Lipid panel    Medication Changes: Meds ordered this encounter  Medications   Omega-3 Fatty Acids (FISH OIL) 1000 MG CPDR    Sig: Take 2,000 mg by mouth daily.    Dose decreased 09/05/2021    Disposition:  Follow up  6 months.  Signed, Jonelle Sidle, MD, [facility] 09/05/2021 4:09 PM    [facility] at [facility] [address] [place], [place], [place] [address] Phone: [phone]; Fax: [phone]

## 2021-09-06 ENCOUNTER — Encounter: Payer: Self-pay | Admitting: *Deleted

## 2021-09-07 ENCOUNTER — Telehealth: Payer: Self-pay | Admitting: Cardiology

## 2021-09-07 NOTE — Telephone Encounter (Signed)
Informed pt that we have not received lab work from [facility] at this time, but once received will have provider look at it. Pt verbalized understanding.

## 2021-09-07 NOTE — Telephone Encounter (Signed)
° °  Pt is calling to check if Dr. Diona Browner got the lab result at [facility]

## 2021-09-14 ENCOUNTER — Telehealth: Payer: Self-pay | Admitting: Cardiology

## 2021-09-14 NOTE — Telephone Encounter (Signed)
Says he did lab work (lipid panel) in December 2022 before his recent visit. Advised that we never received it but it has been requested from [facility] lab earlier today. Advised that if we don't receive it, he would be contacted to get repeat test. Verbalized understanding

## 2021-09-14 NOTE — Telephone Encounter (Signed)
Called transfer to [place].

## 2021-09-14 NOTE — Telephone Encounter (Signed)
[facility] lab contacted and says there is no record of patient getting any recent lipid panel.

## 2021-09-26 IMAGING — DX DG HAND COMPLETE 3+V*R*
3 series · 3 of 3 positions shown · non-contrast
Comparison: Prior right wrist films on 12/11/2012

CLINICAL DATA: Right hand and wrist swelling after fall 2 days ago.

EXAM:
RIGHT HAND - COMPLETE 3+ VIEW

[hand pa]
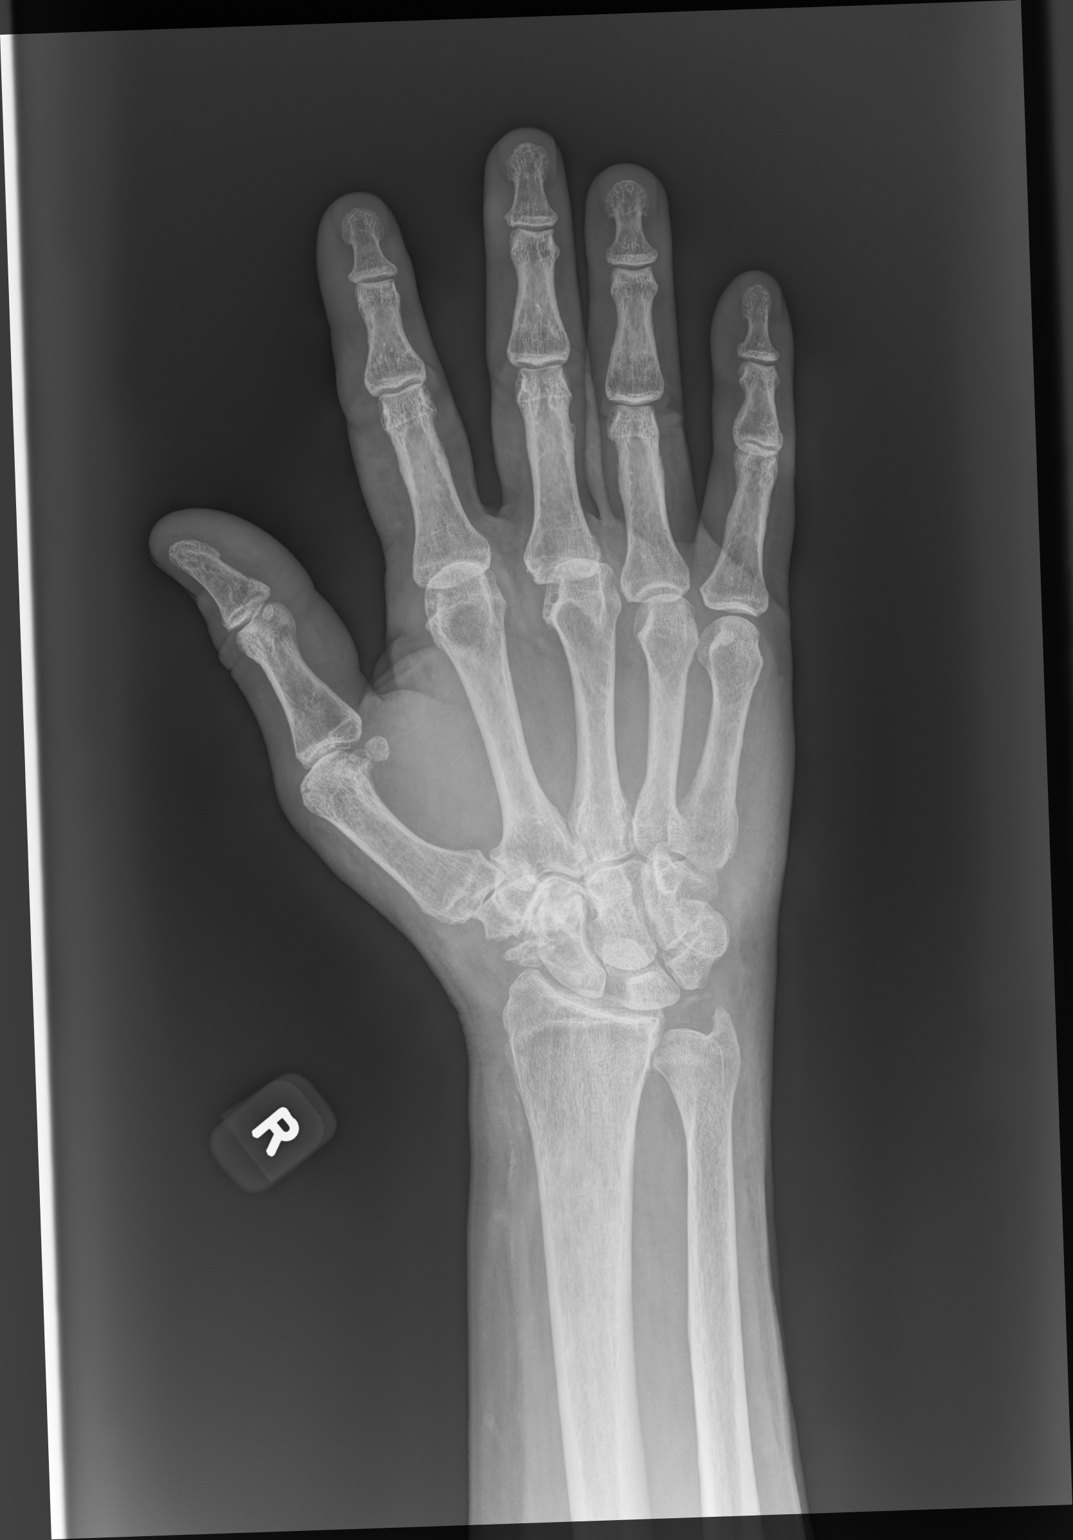

[hand mlo]
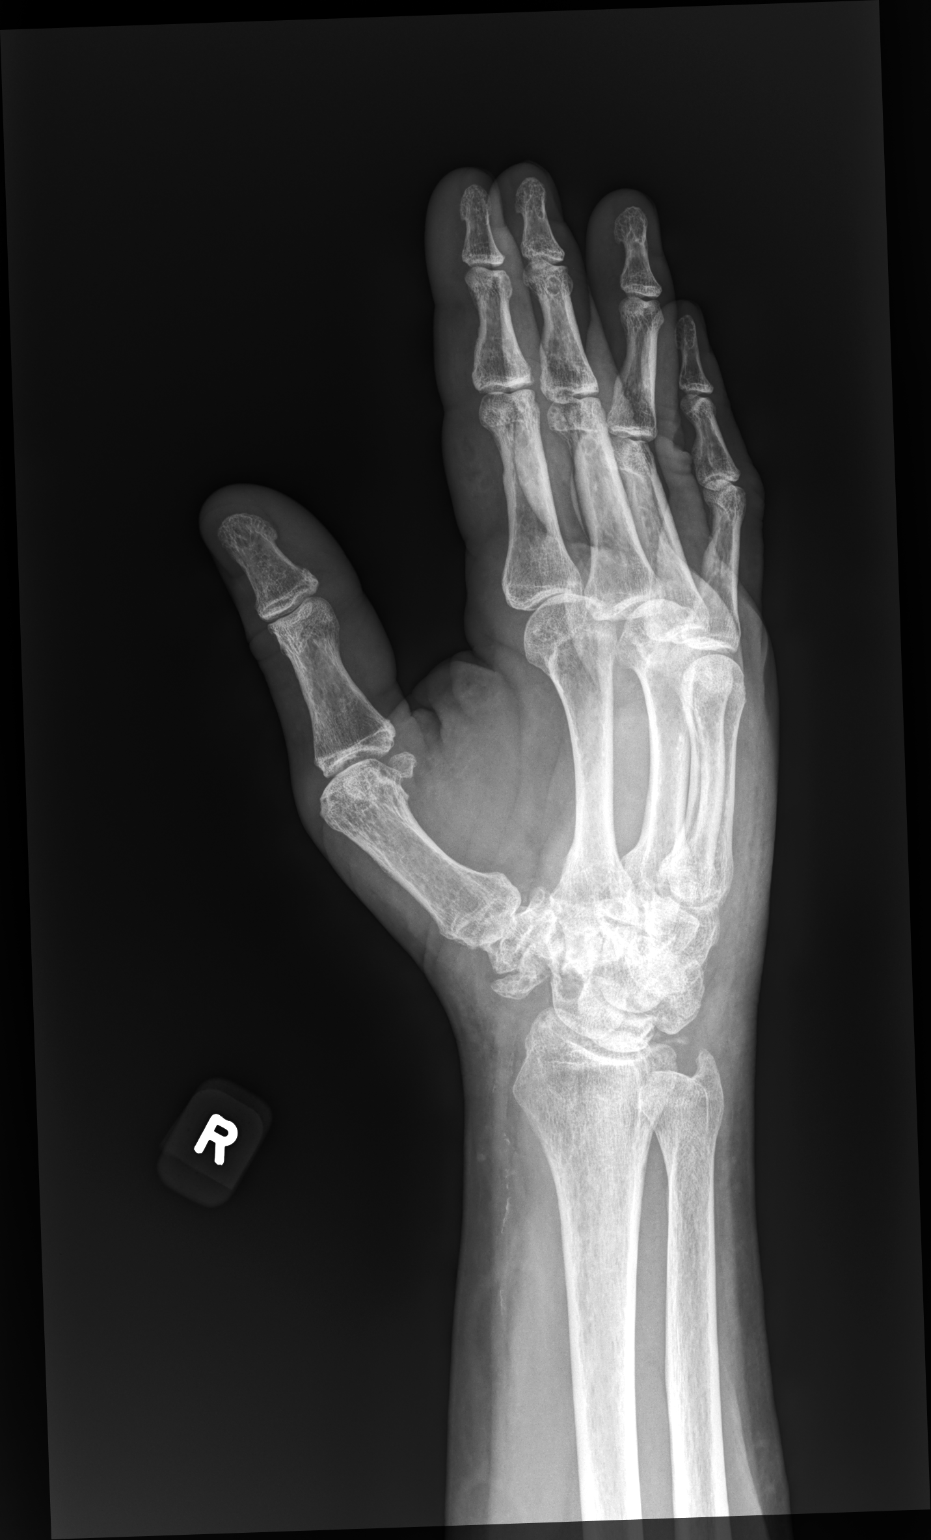

[hand lat]
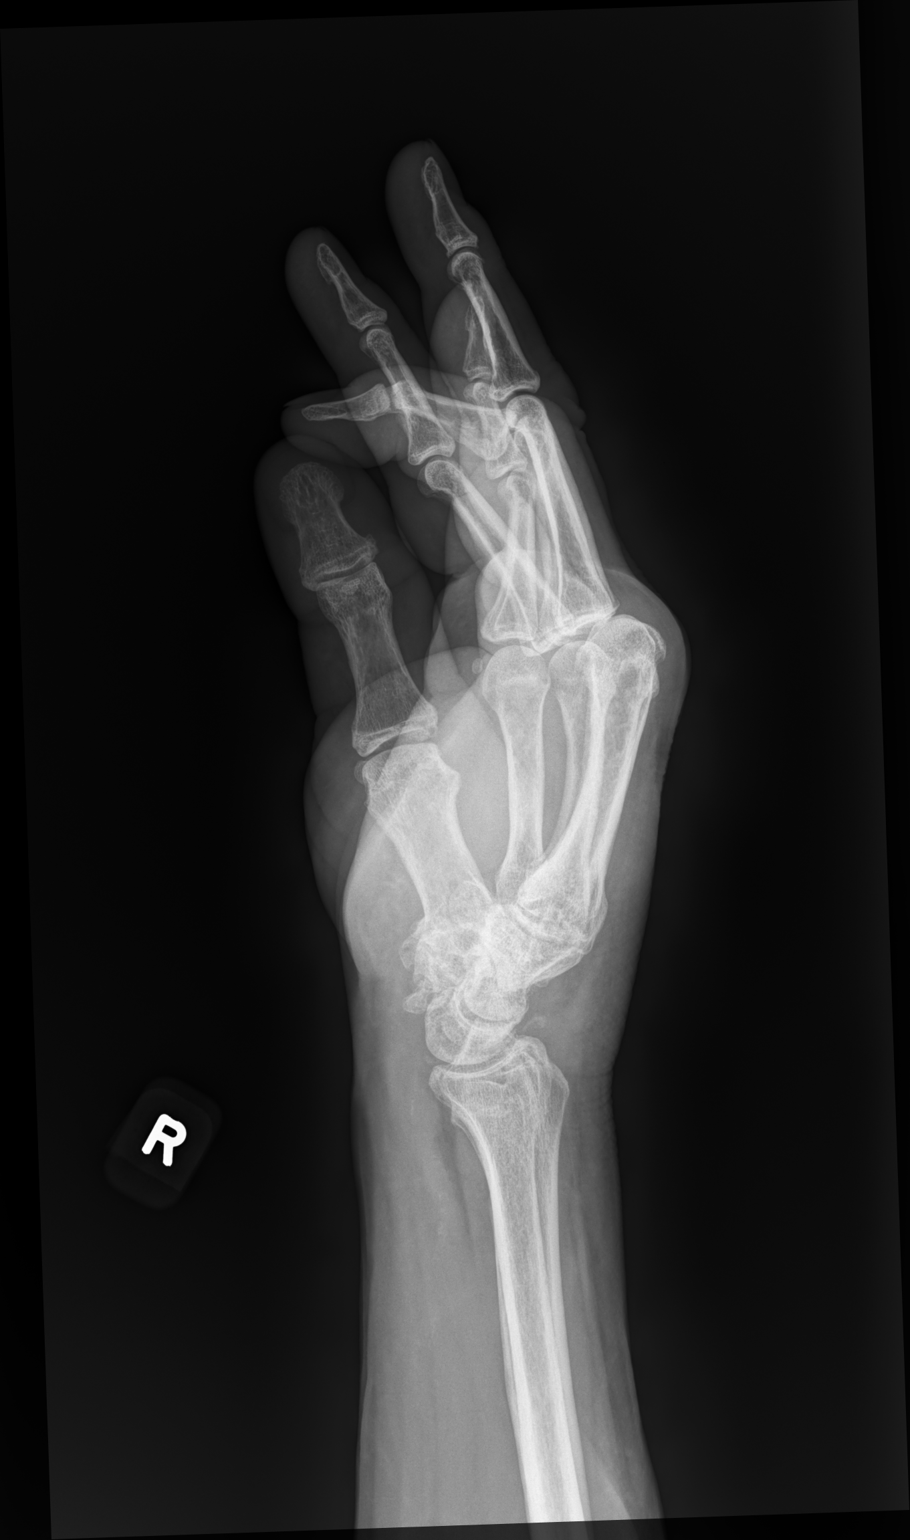

[3 of 3 positions shown; findings below may reference images not displayed]

FINDINGS: No acute fracture or dislocation identified. Progressive
degenerative disease noted involving the carpal bones. There may be
an old injury involving the trapezium. Significant degenerative
disease is seen involving the first carpometacarpal joint. No bony
lesions or destruction. Vascular calcifications at the level of the
radial artery.
IMPRESSION: No acute fracture identified. Progressive degenerative disease
involving the carpal bones and first carpometacarpal joint.

## 2021-10-06 LAB — LIPID PANEL
Cholesterol: 135 mg/dL (ref <200)
HDL: 60 mg/dL (ref >=40)
LDL Cholesterol (Calc): 61 mg/dL (calc)
Non-HDL Cholesterol (Calc): 75 mg/dL (calc) (ref <130)
Total CHOL/HDL Ratio: 2.3 (calc) (ref <5.0)
Triglycerides: 56 mg/dL (ref <150)

## 2021-11-27 ENCOUNTER — Telehealth: Payer: Self-pay | Admitting: Cardiology

## 2021-11-27 DIAGNOSIS — R002 Palpitations: Secondary | ICD-10-CM

## 2021-11-27 NOTE — Telephone Encounter (Signed)
Patient will come 11/28/21 for Zio placement at 0900 hrs ?

## 2021-11-27 NOTE — Telephone Encounter (Signed)
Patient walked into the office stating that he continues to have heart palpitations. States that Dr. Diona Browner had told patient that if this continues he needs to contact the office.Marland Kitchen ?

## 2021-11-27 NOTE — Telephone Encounter (Signed)
Jeffery Goodwin reports he notes more palpitations then what he had when at last office visit in January. Describes as elevated HR noted in throat that radiates down center of chest. ? ? ?I will forward to Dr.McDowell for review ?

## 2021-11-28 ENCOUNTER — Other Ambulatory Visit: Payer: Self-pay | Admitting: Cardiology

## 2021-11-28 ENCOUNTER — Ambulatory Visit: Payer: Medicare HMO

## 2021-11-28 ENCOUNTER — Ambulatory Visit (INDEPENDENT_AMBULATORY_CARE_PROVIDER_SITE_OTHER): Payer: Medicare HMO

## 2021-11-28 DIAGNOSIS — R002 Palpitations: Secondary | ICD-10-CM

## 2021-12-12 DIAGNOSIS — R002 Palpitations: Secondary | ICD-10-CM | POA: Diagnosis not present

## 2021-12-19 ENCOUNTER — Telehealth: Payer: Self-pay

## 2021-12-19 DIAGNOSIS — R002 Palpitations: Secondary | ICD-10-CM

## 2021-12-19 NOTE — Telephone Encounter (Signed)
Patient is returning RN's call.

## 2021-12-19 NOTE — Telephone Encounter (Signed)
-----   Message from Jonelle Sidle, MD sent at 12/19/2021  8:24 AM EDT ----- ?Results reviewed.  Overall heart rhythm is normal with average heart rate in the mid 50s.  He does have atrial and ventricular ectopy including occasional PVCs and bursts of NSVT.  Also multiple short episodes of PSVT.  Likely feeling these as his intermittent palpitations, importantly no sustained arrhythmias such as atrial fibrillation.  In the absence of any angina symptoms or exercise intolerance, would continue with medical therapy and observation.  Suggest getting an echocardiogram however to ensure normal LVEF. ?

## 2021-12-19 NOTE — Telephone Encounter (Signed)
Echo scheduled.

## 2021-12-19 NOTE — Telephone Encounter (Signed)
Results discussed with patient, echo ordered, pcp copied ?

## 2022-01-02 ENCOUNTER — Telehealth: Payer: Self-pay | Admitting: Cardiology

## 2022-01-02 NOTE — Telephone Encounter (Signed)
?  Pt c/o medication issue: ? ?1. Name of Medication: rosuvastatin (CRESTOR) 5 MG tablet ? ?2. How are you currently taking this medication (dosage and times per day)? Take 1 tablet (5 mg total) by mouth daily. takes 2.5 mg weekly ? ?3. Are you having a reaction (difficulty breathing--STAT)?  no ? ?4. What is your medication issue?  Patient is having joint pain. Would like to discuss options.  ?

## 2022-01-02 NOTE — Telephone Encounter (Signed)
Pt stated that he has been having joint pain since starting Crestor.Pt stated that he is supposed to take 5mg  daily, but has to cut his dose in half to tolerate it.  Pt talked to a nurse he knows who suggested that he switch to Pravastatin which may be tolerated better. Pt would like to know if this is a possibility or should he stay on Crestor. Please advise.  ?

## 2022-01-02 NOTE — Telephone Encounter (Signed)
Pt stated that he is happy staying with the medication he is currently taking.  ?

## 2022-01-03 ENCOUNTER — Ambulatory Visit (HOSPITAL_COMMUNITY)
Admission: RE | Admit: 2022-01-03 | Discharge: 2022-01-03 | Disposition: A | Discharge status: Home / Self Care | Payer: Medicare HMO | Source: Ambulatory Visit | Attending: Cardiology | Admitting: Cardiology

## 2022-01-03 DIAGNOSIS — R002 Palpitations: Secondary | ICD-10-CM | POA: Insufficient documentation

## 2022-01-03 DIAGNOSIS — I2510 Atherosclerotic heart disease of native coronary artery without angina pectoris: Secondary | ICD-10-CM | POA: Insufficient documentation

## 2022-01-03 DIAGNOSIS — I517 Cardiomegaly: Secondary | ICD-10-CM | POA: Diagnosis not present

## 2022-01-03 DIAGNOSIS — Z87891 Personal history of nicotine dependence: Secondary | ICD-10-CM | POA: Insufficient documentation

## 2022-01-03 DIAGNOSIS — E785 Hyperlipidemia, unspecified: Secondary | ICD-10-CM | POA: Diagnosis not present

## 2022-01-03 LAB — ECHOCARDIOGRAM COMPLETE
Area-P 1/2: 2.71 cm2
MV M vel: 3.43 m/s
MV Peak grad: 47.1 mmHg
S' Lateral: 4.5 cm

## 2022-01-04 ENCOUNTER — Telehealth: Payer: Self-pay | Admitting: Cardiology

## 2022-01-04 NOTE — Telephone Encounter (Signed)
Pt notified and verbalized understanding. Pt had no questions or concerns at this time. PCP copied °

## 2022-01-04 NOTE — Telephone Encounter (Signed)
Jonelle Sidle, MD  ?01/03/2022  4:32 PM EDT   ?  ?Results reviewed.  Follow-up echocardiogram shows low normal LVEF at 50 to 55%, no significant valvular abnormalities.  Overall reassuring, I was mainly wanting to exclude a significant cardiomyopathy.  Continue with current treatment plan.  ? ?

## 2022-01-04 NOTE — Telephone Encounter (Signed)
Patient returning call for echo results. 

## 2022-02-26 ENCOUNTER — Other Ambulatory Visit: Payer: Self-pay

## 2022-02-26 DIAGNOSIS — E782 Mixed hyperlipidemia: Secondary | ICD-10-CM

## 2022-02-27 DIAGNOSIS — E782 Mixed hyperlipidemia: Secondary | ICD-10-CM | POA: Diagnosis not present

## 2022-02-28 LAB — LIPID PANEL
Cholesterol: 143 mg/dL (ref <200)
HDL: 58 mg/dL (ref >=40)
LDL Cholesterol (Calc): 71 mg/dL (calc)
Non-HDL Cholesterol (Calc): 85 mg/dL (calc) (ref <130)
Total CHOL/HDL Ratio: 2.5 (calc) (ref <5.0)
Triglycerides: 55 mg/dL (ref <150)

## 2022-03-05 ENCOUNTER — Encounter: Payer: Self-pay | Admitting: Cardiology

## 2022-03-05 ENCOUNTER — Ambulatory Visit: Payer: Medicare HMO | Admitting: Cardiology

## 2022-03-05 VITALS — BP 118/70 | HR 50 | Ht 73.0 in | Wt 214.8 lb

## 2022-03-05 DIAGNOSIS — M791 Myalgia, unspecified site: Secondary | ICD-10-CM

## 2022-03-05 DIAGNOSIS — T466X5A Adverse effect of antihyperlipidemic and antiarteriosclerotic drugs, initial encounter: Secondary | ICD-10-CM

## 2022-03-05 DIAGNOSIS — I25119 Atherosclerotic heart disease of native coronary artery with unspecified angina pectoris: Secondary | ICD-10-CM

## 2022-03-05 DIAGNOSIS — E782 Mixed hyperlipidemia: Secondary | ICD-10-CM | POA: Diagnosis not present

## 2022-03-05 NOTE — Patient Instructions (Signed)

## 2022-03-05 NOTE — Progress Notes (Signed)
Cardiology Office Note  Date: 03/05/2022   ID: Jeffery Goodwin [birth year], MRN [id number]  PCP:  Benita Stabile, MD  Cardiologist:  Nona Dell, MD Electrophysiologist:  None   Chief Complaint  Patient presents with   Cardiac follow-up    History of Present Illness: Jeffery Goodwin is a 71 y.o. male last seen in January.  He is here for a routine visit.  No angina symptoms reported.  He did have to stop swimming mainly due to orthopedic issues.  He is considering having a left knee replacement.  Follow-up cardiac testing earlier in the year included a cardiac monitor in April showing sinus rhythm with atrial and ventricular ectopy as well as brief episodes of NSVT and PSVT.  He did not have any atrial fibrillation.  Echocardiogram in May showed LVEF 50 to 55% range.  Recent lipid panel showed LDL 71, HDL 58. Crestor is at 2.5 mg daily along with Zetia.  Still having trouble with myalgias despite low-dose treatment.  We did check into coverage for PCSK9 inhibitors in the past and at least at that point cost was significant.  We will check on this again.  I personally reviewed his ECG today which shows sinus bradycardia with sinus arrhythmia, left anterior fascicular block, and increased voltage.  Past Medical History:  Diagnosis Date   Arthritis    Carpal tunnel syndrome    Coronary atherosclerosis of native coronary artery    DES mid LCx/OM2 12/2013   Dry eyes    GERD (gastroesophageal reflux disease)    History of shingles    Hyperlipidemia    Neck pain    Nocturia    Tremor    Right hand    Past Surgical History:  Procedure Laterality Date   ANTERIOR CERVICAL DECOMP/DISCECTOMY FUSION N/A 12/20/2014   Procedure: ANTERIOR CERVICAL DECOMPRESSION/DISCECTOMY FUSION 3 LEVELS;  Surgeon: [name], MD;  Location: [facility];  Service: Neurosurgery;  Laterality: N/A;  ANTERIOR CERVICAL DECOMPRESSION/DISCECTOMY FUSION 3 LEVELS C3-4,4-5,5-6   Arthroscopic left knee surgery   2005/2013   Carpal tunnel repair Right 1998   COLONOSCOPY     x2   COLONOSCOPY WITH PROPOFOL N/A 09/09/2017   Procedure: COLONOSCOPY WITH PROPOFOL;  Surgeon: [name], MD;  Location: [facility];  Service: Endoscopy;  Laterality: N/A;  8:30am   INTRAMEDULLARY (IM) NAIL INTERTROCHANTERIC Left 09/13/2015   Procedure: INTRAMEDULLARY  NAIL INTERTROCHANTRIC;  Surgeon: [name], MD;  Location: [facility] ORS;  Service: Orthopedics;  Laterality: Left;   LEFT HEART CATHETERIZATION WITH CORONARY ANGIOGRAM N/A 12/11/2013   Procedure: LEFT HEART CATHETERIZATION WITH CORONARY ANGIOGRAM;  Surgeon: [name], MD;  Location: [facility] CATH LAB;  Service: Cardiovascular;  Laterality: N/A;   Left inguinal herniorrhaphy  2008    Current Outpatient Medications  Medication Sig Dispense Refill   aspirin EC 81 MG tablet Take 1 tablet (81 mg total) by mouth daily. Swallow whole. 90 tablet 3   cholecalciferol (VITAMIN D) 1000 units tablet Take 2,000 Units daily by mouth.      ezetimibe (ZETIA) 10 MG tablet Take 1 tablet (10 mg total) by mouth daily. 90 tablet 3   Misc Natural Products (GLUCOS-CHONDROIT-MSM COMPLEX PO) Take 3 tablets by mouth every morning.      Omega-3 Fatty Acids (FISH OIL) 1000 MG CPDR Take 2,000 mg by mouth daily.     Probiotic Product (PROBIOTIC-10 ULTIMATE PO) Take by mouth daily.     propranolol (INDERAL) 10 MG tablet  Take 10 mg by mouth daily.     rosuvastatin (CRESTOR) 5 MG tablet Take 2.5 mg by mouth daily.     Saw Palmetto 450 MG CAPS Take 450 mg by mouth daily.     vitamin C (ASCORBIC ACID) 500 MG tablet Take 500 mg by mouth daily.     No current facility-administered medications for this visit.   Allergies:  Asa [aspirin]   ROS: No progressive palpitations or unexplained syncope.  Physical Exam: VS:  BP 118/70   Pulse (!) 50   Ht 6\' 1"  (1.854 m)   Wt 214 lb 12.8 oz (97.4 kg)   SpO2 95%   BMI 28.34 kg/m , BMI Body mass index is 28.34 kg/m.  Wt Readings from Last 3  Encounters:  03/05/22 214 lb 12.8 oz (97.4 kg)  09/05/21 218 lb (98.9 kg)  03/23/21 215 lb (97.5 kg)    General: Patient appears comfortable at rest. HEENT: Conjunctiva and lids normal. Neck: Supple, no elevated JVP or carotid bruits. Lungs: Clear to auscultation, nonlabored breathing at rest. Cardiac: Regular rate and rhythm, no S3 or significant systolic murmur, no pericardial rub. Extremities: No pitting edema.  ECG:  An ECG dated 03/23/2021 was personally reviewed today and demonstrated:  Sinus bradycardia with PAC, nonspecific ST changes.  Recent Labwork:    Component Value Date/Time   CHOL 143 02/27/2022 0853   TRIG 55 02/27/2022 0853   HDL 58 02/27/2022 0853   CHOLHDL 2.5 02/27/2022 0853   VLDL 7 05/18/2020 1255   LDLCALC 71 02/27/2022 0853    Other Studies Reviewed Today:  Lexiscan Myoview 06/02/2020: No diagnostic ST segment changes to indicate ischemia. Small, mild intensity, mid to apical inferior/inferoseptal defect that is fixed and most consistent with soft tissue attenuation. No large ischemic territories. This is a low risk study. Nuclear stress EF: 48%. Visually ejection fraction appears normal, would suggest confirmation with echocardiography.  Cardiac monitor April 2023: ZIO XT reviewed.  6 days, 15 hours analyzed.  Predominant rhythm is sinus with heart rate ranging from 34 bpm up to 87 bpm and average heart rate 55 bpm.     There were rare PACs including atrial couplets and triplets representing less than 1% total beats.  There were occasional PVCs representing 1.6% total beats with otherwise rare ventricular couplets and triplets.     Ventricular bigeminy and trigeminy were noted as well as 6 episodes of NSVT, the longest of which lasted for approximately 11 seconds.  There were also multiple episodes of PSVT with the longest event lasting 15 beats.  No pauses were noted and there were otherwise no sustained arrhythmias.  Echocardiogram 01/03/2022:  1. Left  ventricular ejection fraction, by estimation, is 50 to 55%. The  left ventricle has low normal function. The left ventricle has no regional  wall motion abnormalities. The left ventricular internal cavity size was  mildly dilated. There is mild  left ventricular hypertrophy. Left ventricular diastolic parameters are  indeterminate.   2. Right ventricular systolic function is normal. The right ventricular  size is normal. Tricuspid regurgitation signal is inadequate for assessing  PA pressure.   3. Left atrial size was mildly dilated.   4. The mitral valve is normal in structure. Trivial mitral valve  regurgitation. No evidence of mitral stenosis.   5. The aortic valve is tricuspid. Aortic valve regurgitation is not  visualized. No aortic stenosis is present.   6. The inferior vena cava is normal in size with greater than 50%  respiratory variability, suggesting right atrial pressure of 3 mmHg.   Assessment and Plan:  1.  CAD status post DES to the circumflex/OM in 2015.  Follow-up ischemic testing in September 2021 was low risk.  He does not report any active angina at this time.  ECG reviewed.  We will continue with medical therapy and observation.  Currently on aspirin, Crestor, and Zetia.  2.  Mixed hyperlipidemia with statin intolerance.  He is on Crestor 2.5 mg daily and Zetia.  Recent LDL 71.  Still having muscle soreness and joint pains.  We will reassess coverage for PCSK9 inhibitors.   Medication Adjustments/Labs and Tests Ordered: Current medicines are reviewed at length with the patient today.  Concerns regarding medicines are outlined above.   Tests Ordered: Orders Placed This Encounter  Procedures   EKG 12-Lead    Medication Changes: No orders of the defined types were placed in this encounter.   Disposition:  Follow up  6 months.  Signed, Jonelle Sidle, MD, [facility] 03/05/2022 1:35 PM    [facility] at [facility] [address] [place], [place],  [place] [address] Phone: [phone]; Fax: [phone]

## 2022-03-07 ENCOUNTER — Telehealth: Payer: Self-pay | Admitting: Pharmacist

## 2022-03-07 NOTE — Telephone Encounter (Signed)
Received message from Dr Diona Browner to look into [facility] for pt. LDL currently 71 on rosuvastatin 2.5mg  daily and ezetimibe 10mg  daily, pt having trouble with myalgias. Has hx of CAD s/p DES.  Praluent is preferred on his formulary at $47/month. Does not have an annual deductible, does not take other branded meds so shouldn't be in the donut hole.  Left message for pt to discuss.

## 2022-03-07 NOTE — Telephone Encounter (Signed)
Pt returned call to clinic. He is ok with the cost and is interested in trying Praluent and stopping his rosuvastatin due to leg pain. Will submit prior authorization then follow up with pt once approved.

## 2022-03-08 MED ORDER — PRALUENT 75 MG/ML ~~LOC~~ SOAJ: 1.0000 | SUBCUTANEOUS | 11 refills | Status: DC

## 2022-03-08 NOTE — Telephone Encounter (Signed)
Praluent PA approved through 09/02/22. Rx sent to pharmacy and pt made aware. He will stop his low dose rosuvastatin since this was causing leg pain, and continue on his ezetimibe for now. Can have lipids rechecked at December visit with Dr Diona Browner.

## 2022-03-27 ENCOUNTER — Other Ambulatory Visit (HOSPITAL_COMMUNITY): Payer: Self-pay | Admitting: Family Medicine

## 2022-03-27 DIAGNOSIS — M8580 Other specified disorders of bone density and structure, unspecified site: Secondary | ICD-10-CM

## 2022-04-13 ENCOUNTER — Ambulatory Visit (HOSPITAL_COMMUNITY)
Admission: RE | Admit: 2022-04-13 | Discharge: 2022-04-13 | Disposition: A | Discharge status: Home / Self Care | Payer: Medicare HMO | Source: Ambulatory Visit | Attending: Family Medicine | Admitting: Family Medicine

## 2022-04-13 DIAGNOSIS — M85851 Other specified disorders of bone density and structure, right thigh: Secondary | ICD-10-CM | POA: Diagnosis not present

## 2022-04-13 DIAGNOSIS — M8580 Other specified disorders of bone density and structure, unspecified site: Secondary | ICD-10-CM

## 2022-04-13 DIAGNOSIS — Z13820 Encounter for screening for osteoporosis: Secondary | ICD-10-CM | POA: Insufficient documentation

## 2022-05-03 DIAGNOSIS — X32XXXD Exposure to sunlight, subsequent encounter: Secondary | ICD-10-CM | POA: Diagnosis not present

## 2022-05-03 DIAGNOSIS — L570 Actinic keratosis: Secondary | ICD-10-CM | POA: Diagnosis not present

## 2022-05-10 DIAGNOSIS — M1712 Unilateral primary osteoarthritis, left knee: Secondary | ICD-10-CM | POA: Diagnosis not present

## 2022-05-10 DIAGNOSIS — M25562 Pain in left knee: Secondary | ICD-10-CM | POA: Diagnosis not present

## 2022-06-25 DIAGNOSIS — E785 Hyperlipidemia, unspecified: Secondary | ICD-10-CM | POA: Diagnosis not present

## 2022-06-25 DIAGNOSIS — R7301 Impaired fasting glucose: Secondary | ICD-10-CM | POA: Diagnosis not present

## 2022-06-25 DIAGNOSIS — N400 Enlarged prostate without lower urinary tract symptoms: Secondary | ICD-10-CM | POA: Diagnosis not present

## 2022-06-25 DIAGNOSIS — E538 Deficiency of other specified B group vitamins: Secondary | ICD-10-CM | POA: Diagnosis not present

## 2022-06-25 DIAGNOSIS — E559 Vitamin D deficiency, unspecified: Secondary | ICD-10-CM | POA: Diagnosis not present

## 2022-07-02 ENCOUNTER — Other Ambulatory Visit (HOSPITAL_COMMUNITY): Payer: Self-pay | Admitting: Family Medicine

## 2022-07-02 DIAGNOSIS — Z87891 Personal history of nicotine dependence: Secondary | ICD-10-CM | POA: Diagnosis not present

## 2022-07-02 DIAGNOSIS — Z955 Presence of coronary angioplasty implant and graft: Secondary | ICD-10-CM | POA: Diagnosis not present

## 2022-07-02 DIAGNOSIS — E559 Vitamin D deficiency, unspecified: Secondary | ICD-10-CM | POA: Diagnosis not present

## 2022-07-02 DIAGNOSIS — G250 Essential tremor: Secondary | ICD-10-CM | POA: Diagnosis not present

## 2022-07-02 DIAGNOSIS — R7301 Impaired fasting glucose: Secondary | ICD-10-CM | POA: Diagnosis not present

## 2022-07-02 DIAGNOSIS — E538 Deficiency of other specified B group vitamins: Secondary | ICD-10-CM | POA: Diagnosis not present

## 2022-07-02 DIAGNOSIS — Z23 Encounter for immunization: Secondary | ICD-10-CM | POA: Diagnosis not present

## 2022-07-02 DIAGNOSIS — N400 Enlarged prostate without lower urinary tract symptoms: Secondary | ICD-10-CM | POA: Diagnosis not present

## 2022-07-02 DIAGNOSIS — Z125 Encounter for screening for malignant neoplasm of prostate: Secondary | ICD-10-CM | POA: Diagnosis not present

## 2022-07-02 DIAGNOSIS — M8580 Other specified disorders of bone density and structure, unspecified site: Secondary | ICD-10-CM | POA: Diagnosis not present

## 2022-07-02 DIAGNOSIS — Z0000 Encounter for general adult medical examination without abnormal findings: Secondary | ICD-10-CM | POA: Diagnosis not present

## 2022-07-02 DIAGNOSIS — E782 Mixed hyperlipidemia: Secondary | ICD-10-CM | POA: Diagnosis not present

## 2022-07-16 ENCOUNTER — Telehealth: Payer: Self-pay | Admitting: Cardiology

## 2022-07-16 NOTE — Telephone Encounter (Signed)
Pt came in the office and stated that he received a letter from his insurance stating that his praluent will not be covered in 2024. They stated they will be sending information to Dr. Diona Browner to fill out so they would possibly cover the med. Pt left letter with front desk.

## 2022-07-16 NOTE — Telephone Encounter (Signed)
Noted. Will await prior authorization

## 2022-07-19 NOTE — Telephone Encounter (Signed)
Received fax that says Repatha is the current formulary alternative. PA for Repatha should be done.

## 2022-08-07 NOTE — Telephone Encounter (Signed)
Repath PA denied.

## 2022-08-07 NOTE — Telephone Encounter (Signed)
Attempted PA again.  Received error: A pending Prior Authorization request for the Patient and Medication exist

## 2022-08-07 NOTE — Telephone Encounter (Signed)
Patient is following up requesting an update on Repatha PA. He is requesting to speak with Lynden Ang if at all possible.

## 2022-08-08 NOTE — Telephone Encounter (Signed)
Per Cover My Meds:  Wait for Determination Please wait for [employer] NCPDP 2017 to return a determination.  Received fax from [facility] for more information.

## 2022-08-10 ENCOUNTER — Telehealth: Payer: Medicare HMO | Admitting: Cardiology

## 2022-08-10 NOTE — Telephone Encounter (Signed)
Samantha from [facility] waiting on fax (previous phone note). She states that it needs to be filled out in its entirely in order for it to go through.   Fax number: [phone]

## 2022-08-10 NOTE — Telephone Encounter (Signed)
Repatha will not be covered be covered until September 03, 2022.  Patient stated that he will continue Praluent until the beginning of the year. Will resubmit Repatha PA in Jan.2024.

## 2022-08-14 ENCOUNTER — Telehealth: Payer: Self-pay | Admitting: Cardiology

## 2022-08-14 DIAGNOSIS — E782 Mixed hyperlipidemia: Secondary | ICD-10-CM

## 2022-08-14 NOTE — Telephone Encounter (Signed)
Patient notified and verbalized understanding. Lab work waiting at the front desk for pick up.

## 2022-08-14 NOTE — Telephone Encounter (Signed)
Pt had Lipid panel dawn 02/2022. Does he need more f/u lab work?

## 2022-08-14 NOTE — Telephone Encounter (Signed)
Pt came in office and asked about having lab work done before his next appt on 12/20 w/Dr. Diona Browner. The Pt stated that he usually does lab work before his appts and that he needs a print out because he has his blood drawn across the street.

## 2022-08-15 DIAGNOSIS — M1712 Unilateral primary osteoarthritis, left knee: Secondary | ICD-10-CM | POA: Diagnosis not present

## 2022-08-16 ENCOUNTER — Telehealth: Payer: Self-pay | Admitting: Cardiology

## 2022-08-16 DIAGNOSIS — E782 Mixed hyperlipidemia: Secondary | ICD-10-CM | POA: Diagnosis not present

## 2022-08-16 NOTE — Telephone Encounter (Signed)
Primary Cardiologist:Samuel Diona Browner, MD  Chart reviewed as part of pre-operative protocol coverage. Because of Boleslaw Borghi Eklund's past medical history and time since last visit, he/she will require a follow-up visit in order to better assess preoperative cardiovascular risk.  Pre-op covering staff: - Patient has an appointment on 12/21 with Dr.McDowell at which time clearance can be addressed. Appointment notes have been updated.  - Please contact requesting surgeon's office via preferred method (i.e, phone, fax) to inform them of need for appointment prior to surgery.  If applicable, this message will also be routed to pharmacy pool and/or primary cardiologist for input on holding anticoagulant/antiplatelet agent as requested below so that this information is available at time of patient's appointment.   Levi Aland, NP-C  08/16/2022, 4:24 PM [address] Office [phone] Fax [phone]

## 2022-08-16 NOTE — Telephone Encounter (Signed)
   Pre-operative Risk Assessment    Patient Name: Jeffery Goodwin  DOB: Dec 10, 1950 MRN: [id number]      Request for Surgical Clearance    Procedure:   Left partial knee replacement  Date of Surgery:  Clearance TBD                                 Surgeon:  Eulas Post, M.D. Surgeon's Group or Practice Name:  [facility] Phone number:  [phone]: [phone] Fax number:  [phone]   Type of Clearance Requested:   - Medical    Type of Anesthesia:  Spinal   Additional requests/questions:    SignedSeymour Bars   08/16/2022, 12:52 PM

## 2022-08-17 LAB — LIPID PANEL
Cholesterol: 120 mg/dL (ref <200)
HDL: 69 mg/dL (ref >=40)
LDL Cholesterol (Calc): 37 mg/dL (calc)
Non-HDL Cholesterol (Calc): 51 mg/dL (calc) (ref <130)
Total CHOL/HDL Ratio: 1.7 (calc) (ref <5.0)
Triglycerides: 49 mg/dL (ref <150)

## 2022-08-20 ENCOUNTER — Ambulatory Visit (HOSPITAL_COMMUNITY)
Admission: RE | Admit: 2022-08-20 | Discharge: 2022-08-20 | Disposition: A | Discharge status: Home / Self Care | Payer: Medicare HMO | Source: Ambulatory Visit | Attending: Family Medicine | Admitting: Family Medicine

## 2022-08-20 DIAGNOSIS — Z87891 Personal history of nicotine dependence: Secondary | ICD-10-CM | POA: Diagnosis not present

## 2022-08-22 NOTE — Progress Notes (Signed)
 Cardiology Office Note  Date: 08/23/2022   ID: Alfonzo, Arca 10-18-1950, MRN [id number]  PCP:  Benita Stabile, MD  Cardiologist:  Nona Dell, MD Electrophysiologist:  None   Chief Complaint  Patient presents with   Cardiac follow-up    History of Present Illness: Jeffery Goodwin is a 71 y.o. male last seen in July.  He is here for a routine visit.  At this point he is being considered for left partial knee replacement under spinal anesthesia with Dr. Dion Saucier.  Operation has not yet been scheduled.  From a cardiac perspective, he does not describe any clear-cut angina with regular activity.  He has been limited by left knee pain, [place], also does weight workouts.  Walks his dog as before.  He does not report any dizziness or sudden syncope.  He has done quite well on Praluent with recent LDL 37.  I talked with him about stopping Zetia at this point as I am not certain how much this is really contributing to his LDL control.  We will plan repeat lipid panel for his next visit.  I personally reviewed his ECG today which shows sinus bradycardia with nonspecific ST-T changes and increased voltage.  RCRI perioperative cardiac risk index is class II at 0.9% chance of major adverse cardiac event.  Past Medical History:  Diagnosis Date   Arthritis    Carpal tunnel syndrome    Coronary atherosclerosis of native coronary artery    DES mid LCx/OM2 12/2013   Dry eyes    GERD (gastroesophageal reflux disease)    History of shingles    Hyperlipidemia    Neck pain    Nocturia    Tremor    Right hand    Past Surgical History:  Procedure Laterality Date   ANTERIOR CERVICAL DECOMP/DISCECTOMY FUSION N/A 12/20/2014   Procedure: ANTERIOR CERVICAL DECOMPRESSION/DISCECTOMY FUSION 3 LEVELS;  Surgeon: [name], MD;  Location: [facility];  Service: Neurosurgery;  Laterality: N/A;  ANTERIOR CERVICAL DECOMPRESSION/DISCECTOMY FUSION 3 LEVELS  C3-4,4-5,5-6   Arthroscopic left knee surgery  2005/2013   Carpal tunnel repair Right 1998   COLONOSCOPY     x2   COLONOSCOPY WITH PROPOFOL N/A 09/09/2017   Procedure: COLONOSCOPY WITH PROPOFOL;  Surgeon: [name], MD;  Location: [facility];  Service: Endoscopy;  Laterality: N/A;  8:30am   INTRAMEDULLARY (IM) NAIL INTERTROCHANTERIC Left 09/13/2015   Procedure: INTRAMEDULLARY  NAIL INTERTROCHANTRIC;  Surgeon: [name], MD;  Location: [facility];  Service: Orthopedics;  Laterality: Left;   LEFT HEART CATHETERIZATION WITH CORONARY ANGIOGRAM N/A 12/11/2013   Procedure: LEFT HEART CATHETERIZATION WITH CORONARY ANGIOGRAM;  Surgeon: [name], MD;  Location: [facility] CATH LAB;  Service: Cardiovascular;  Laterality: N/A;   Left inguinal herniorrhaphy  2008    Current Outpatient Medications  Medication Sig Dispense Refill   Alirocumab (PRALUENT) 75 MG/ML SOAJ Inject 1 Pen into the skin every 14 (fourteen) days. 2 mL 11   aspirin EC 81 MG tablet Take 1 tablet (81 mg total) by mouth daily. Swallow whole. 90 tablet 3   cholecalciferol (VITAMIN D) 1000 units tablet Take 2,000 Units daily by mouth.      Misc Natural Products (GLUCOS-CHONDROIT-MSM COMPLEX PO) Take 3 tablets by mouth every morning.      Omega-3 Fatty Acids (FISH OIL) 1000 MG CPDR Take 2,000 mg by mouth daily.     Probiotic  Product (PROBIOTIC-10 ULTIMATE PO) Take by mouth daily.     propranolol (INDERAL) 10 MG tablet Take 10 mg by mouth daily.     Saw Palmetto 450 MG CAPS Take 450 mg by mouth daily.     vitamin C (ASCORBIC ACID) 500 MG tablet Take 500 mg by mouth daily.     No current facility-administered medications for this visit.   Allergies:  Rosuvastatin and Asa [aspirin]   ROS: No orthopnea or PND.  Physical Exam: VS:  BP 126/86   Pulse (!) 53   Ht 6\' 1"  (1.854 m)   Wt 215 lb 6.4 oz (97.7 kg)   SpO2 98%   BMI 28.42 kg/m , BMI Body mass index is 28.42 kg/m.  Wt Readings from Last 3 Encounters:  08/23/22 215  lb 6.4 oz (97.7 kg)  03/05/22 214 lb 12.8 oz (97.4 kg)  09/05/21 218 lb (98.9 kg)    General: Patient appears comfortable at rest. HEENT: Conjunctiva and lids normal. Neck: Supple, no elevated JVP or carotid bruits. Lungs: Clear to auscultation, nonlabored breathing at rest. Cardiac: Regular rate and rhythm, no S3 or significant systolic murmur. Extremities: No pitting edema.  ECG:  An ECG dated 03/05/2022 was personally reviewed today and demonstrated:  Sinus bradycardia with sinus arrhythmia, left anterior fascicular block, increased voltage.  Recent Labwork:    Component Value Date/Time   CHOL 120 08/16/2022 0838   TRIG 49 08/16/2022 0838   HDL 69 08/16/2022 0838   CHOLHDL 1.7 08/16/2022 0838   VLDL 7 05/18/2020 1255   LDLCALC 37 08/16/2022 0838  October 2023: Hemoglobin 13.7, platelets 180, BUN 24, creatinine 0.9, potassium 4.4, AST 24, ALT 16, hemoglobin A1c 5.5%  Other Studies Reviewed Today:  Lexiscan Myoview 06/02/2020: No diagnostic ST segment changes to indicate ischemia. Small, mild intensity, mid to apical inferior/inferoseptal defect that is fixed and most consistent with soft tissue attenuation. No large ischemic territories. This is a low risk study. Nuclear stress EF: 48%. Visually ejection fraction appears normal, would suggest confirmation with echocardiography.   Cardiac monitor April 2023: ZIO XT reviewed.  6 days, 15 hours analyzed.  Predominant rhythm is sinus with heart rate ranging from 34 bpm up to 87 bpm and average heart rate 55 bpm.     There were rare PACs including atrial couplets and triplets representing less than 1% total beats.  There were occasional PVCs representing 1.6% total beats with otherwise rare ventricular couplets and triplets.     Ventricular bigeminy and trigeminy were noted as well as 6 episodes of NSVT, the longest of which lasted for approximately 11 seconds.  There were also multiple episodes of PSVT with the longest event lasting  15 beats.  No pauses were noted and there were otherwise no sustained arrhythmias.   Echocardiogram 01/03/2022:  1. Left ventricular ejection fraction, by estimation, is 50 to 55%. The  left ventricle has low normal function. The left ventricle has no regional  wall motion abnormalities. The left ventricular internal cavity size was  mildly dilated. There is mild  left ventricular hypertrophy. Left ventricular diastolic parameters are  indeterminate.   2. Right ventricular systolic function is normal. The right ventricular  size is normal. Tricuspid regurgitation signal is inadequate for assessing  PA pressure.   3. Left atrial size was mildly dilated.   4. The mitral valve is normal in structure. Trivial mitral valve  regurgitation. No evidence of mitral stenosis.   5. The aortic valve is tricuspid. Aortic valve regurgitation  is not  visualized. No aortic stenosis is present.   6. The inferior vena cava is normal in size with greater than 50%  respiratory variability, suggesting right atrial pressure of 3 mmHg.   Assessment and Plan:  1.  Preoperative cardiac evaluation prior to anticipated left partial knee replacement under spinal anesthesia.  RCRI perioperative cardiac risk index is class II at 0.9% chance of major adverse cardiac event.  He reports no angina with good functional capacity, ECG is stable.  Should be able to proceed without further cardiac testing at relatively low risk.  May hold aspirin and omega-3 supplements at least 7 days prior.  2.  CAD status post DES to the circumflex/OM in 2015.  No active angina with good functional capacity.  Last formal ischemic testing was in 2021.  ECG stable.  Would continue with observation on medical therapy for now.  3.  Hyperlipidemia with statin intolerance.  Continue PCSK9 inhibitor, recent LDL 37 and HDL 69.  Will go ahead and stop Zetia for now.  Will recheck FLP for his next visit.  Medication Adjustments/Labs and Tests  Ordered: Current medicines are reviewed at length with the patient today.  Concerns regarding medicines are outlined above.   Tests Ordered: Orders Placed This Encounter  Procedures   Lipid panel   EKG 12-Lead    Medication Changes: No orders of the defined types were placed in this encounter.   Disposition:  Follow up  6 months.  Signed, Jonelle Sidle, MD, [facility] 08/23/2022 12:00 PM    [facility] at [facility] [address] [place], [place] [address] Phone: [phone]; Fax: [phone]

## 2022-08-23 ENCOUNTER — Ambulatory Visit: Payer: Medicare HMO | Attending: Cardiology | Admitting: Cardiology

## 2022-08-23 ENCOUNTER — Encounter: Payer: Self-pay | Admitting: Cardiology

## 2022-08-23 VITALS — BP 126/86 | HR 53 | Ht 73.0 in | Wt 215.4 lb

## 2022-08-23 DIAGNOSIS — T466X5D Adverse effect of antihyperlipidemic and antiarteriosclerotic drugs, subsequent encounter: Secondary | ICD-10-CM

## 2022-08-23 DIAGNOSIS — I25119 Atherosclerotic heart disease of native coronary artery with unspecified angina pectoris: Secondary | ICD-10-CM

## 2022-08-23 DIAGNOSIS — E782 Mixed hyperlipidemia: Secondary | ICD-10-CM | POA: Diagnosis not present

## 2022-08-23 DIAGNOSIS — M791 Myalgia, unspecified site: Secondary | ICD-10-CM

## 2022-08-23 DIAGNOSIS — T466X5A Adverse effect of antihyperlipidemic and antiarteriosclerotic drugs, initial encounter: Secondary | ICD-10-CM

## 2022-08-23 DIAGNOSIS — Z01810 Encounter for preprocedural cardiovascular examination: Secondary | ICD-10-CM | POA: Diagnosis not present

## 2022-08-23 NOTE — Patient Instructions (Signed)
Medication Instructions:   STOP Zetia  Labwork: Fasting lipids in 6 months  Testing/Procedures: None today  Follow-Up: 6 months  Any Other Special Instructions Will Be Listed Below (If Applicable).  If you need a refill on your cardiac medications before your next appointment, please call your pharmacy.

## 2022-09-05 DIAGNOSIS — H5203 Hypermetropia, bilateral: Secondary | ICD-10-CM | POA: Diagnosis not present

## 2022-09-06 ENCOUNTER — Telehealth: Payer: Self-pay

## 2022-09-06 ENCOUNTER — Other Ambulatory Visit (HOSPITAL_COMMUNITY): Payer: Self-pay

## 2022-09-06 NOTE — Telephone Encounter (Signed)
Pharmacy Patient Advocate Encounter  Received notification from [facility] that the request for prior authorization for Praluent 75mg  has been denied due to ( a previous denial on 07/16/22, per patients pharmacy benefit with [employer], the pt will need an appeal to be done to cover medication in February 2024.) Also there is a note on file to potentially switch to repatha for 2024.  Please advise      How would you like to proceed?  Please be advised appeals may take up to 5 business days to be submitted as pharmacist prepares necessary documentation.  Thank you!

## 2022-09-06 NOTE — Telephone Encounter (Signed)
It looks like Repatha is the preferred agent on his formulary this year - ok to submit prior authorization for Repatha 140mg /mL sureclick 1 injection every 14 days.  Please let me know once it's approved and I can send in the new prescription and let the pt know, thanks!

## 2022-09-07 ENCOUNTER — Other Ambulatory Visit (HOSPITAL_COMMUNITY): Payer: Self-pay

## 2022-09-07 MED ORDER — REPATHA SURECLICK 140 MG/ML ~~LOC~~ SOAJ: 1.0000 | SUBCUTANEOUS | 11 refills | Status: DC

## 2022-09-07 NOTE — Telephone Encounter (Signed)
Rx for Repatha sent to pharmacy, left message for pt notifying of med change.

## 2022-09-07 NOTE — Telephone Encounter (Addendum)
Pharmacy Patient Advocate Encounter  Prior Authorization for Repatha 140MG /ML has been approved.    key# [id number] Effective dates: 09/03/22 through 09/03/2023  The patients pharmacy will be ready to process after the rx is sent in.

## 2022-09-24 NOTE — Telephone Encounter (Signed)
Pt returned your call. He states he listened to your VM however he doesn't need a refill until March.

## 2022-10-02 DIAGNOSIS — M1712 Unilateral primary osteoarthritis, left knee: Secondary | ICD-10-CM | POA: Diagnosis not present

## 2022-10-18 DIAGNOSIS — M1712 Unilateral primary osteoarthritis, left knee: Secondary | ICD-10-CM | POA: Diagnosis not present

## 2022-10-24 ENCOUNTER — Telehealth: Payer: Self-pay | Admitting: Cardiology

## 2022-10-24 NOTE — Telephone Encounter (Signed)
   Pre-operative Risk Assessment    Patient Name: Jeffery Goodwin  DOB: February 06, 1951 MRN: [id number]      Request for Surgical Clearance    Procedure:   Lt total knee arthroplasty  Date of Surgery:  Clearance 01/09/23                                 Surgeon:  Dr. Gaynelle Arabian Surgeon's Group or Practice Name:  [facility] Phone number:  [phone] Fax number:  [phone] att: Glendale Chard   Type of Clearance Requested:   - Medical  - Pharmacy:  Hold        Type of Anesthesia:   Choice   Additional requests/questions:    SignedHipolito Bayley   10/24/2022, 12:46 PM

## 2022-10-24 NOTE — Telephone Encounter (Signed)
   Primary Cardiologist: Rozann Lesches, MD  Chart reviewed as part of pre-operative protocol coverage. Given past medical history and time since last visit, based on ACC/AHA guidelines, Jeffery Goodwin would be at acceptable risk for the planned procedure without further cardiovascular testing.   I spoke with patient and advised that if he develops new symptoms prior to surgery to contact our office to arrange a follow-up appointment. He verbalized understanding.  I will route this recommendation to the requesting party via Epic fax function and remove from pre-op pool.  Please call with questions.  Emmaline Life, NP-C  10/24/2022, 2:33 PM [address] Office [phone] Fax [phone]

## 2022-10-24 NOTE — Telephone Encounter (Signed)
Patient was cleared by Dr. Domenic Polite at office visit on 08/23/2022 with agreement to hold aspirin for 1 week prior to surgery.  I left a message for patient to call back.  Plan to advise patient that if he has any concerning symptoms prior to surgery that is scheduled in May, that he should contact our office. If patient is asymptomatic, will send clearance to requesting provider.   Emmaline Life, NP-C 10/24/2022, 1:09 PM [address] Office [phone] Fax [phone]

## 2022-11-01 ENCOUNTER — Encounter: Payer: Self-pay | Admitting: Radiology

## 2022-11-01 ENCOUNTER — Telehealth: Payer: Self-pay | Admitting: Internal Medicine

## 2022-11-12 DIAGNOSIS — Z01810 Encounter for preprocedural cardiovascular examination: Secondary | ICD-10-CM | POA: Diagnosis not present

## 2022-11-12 DIAGNOSIS — Z01818 Encounter for other preprocedural examination: Secondary | ICD-10-CM | POA: Diagnosis not present

## 2022-11-14 ENCOUNTER — Telehealth: Payer: Self-pay | Admitting: Cardiology

## 2022-11-14 MED ORDER — REPATHA SURECLICK 140 MG/ML ~~LOC~~ SOAJ: 1.0000 | SUBCUTANEOUS | 11 refills | Status: DC

## 2022-11-14 NOTE — Telephone Encounter (Signed)
*  STAT* If patient is at the pharmacy, call can be transferred to refill team.   1. Which medications need to be refilled? (please list name of each medication and dose if known)  Evolocumab (REPATHA SURECLICK) 349 MG/ML SOAJ   2. Which pharmacy/location (including street and city if local pharmacy) is medication to be sent to? [id number] [place] [place] [facility] [place]    3. Do they need a 30 day or 90 day supply?  90 day supply

## 2022-11-14 NOTE — Telephone Encounter (Signed)
Completed.

## 2022-11-19 NOTE — Telephone Encounter (Signed)
Patient notified and verbalized understanding. Patient had no questions or concerns at this time.  

## 2022-11-19 NOTE — Telephone Encounter (Signed)
Pt c/o medication issue:  1. Name of Medication:   Evolocumab (REPATHA SURECLICK) [id number] MG/ML SOAJ   2. How are you currently taking this medication (dosage and times per day)? As prescribed  3. Are you having a reaction (difficulty breathing--STAT)?  No  4. What is your medication issue?   Patient stated he had been taking Praluent at 75 mg and this mediation is 140 mg.  Patient wants clarification on this mediation dosage and instructions.  Patient stated he would rather use 75 mg every 14-15 days.

## 2022-11-19 NOTE — Telephone Encounter (Signed)
The Repatha 140mg  is equivalent to the Praluent 75mg . Should not have any issues

## 2022-11-26 DIAGNOSIS — M25562 Pain in left knee: Secondary | ICD-10-CM | POA: Diagnosis not present

## 2022-11-26 DIAGNOSIS — M1712 Unilateral primary osteoarthritis, left knee: Secondary | ICD-10-CM | POA: Diagnosis not present

## 2022-11-27 ENCOUNTER — Telehealth: Payer: Self-pay | Admitting: Cardiology

## 2022-11-27 NOTE — Telephone Encounter (Signed)
Patient notified and verbalized understanding. 

## 2022-11-27 NOTE — Telephone Encounter (Signed)
Patient would like a call back to discuss irregular readings that he has received on his bp reader. He would like to discuss this before his upcoming procedure in May.   Would also like to discuss his medication repatha and when to start this. Please advise.

## 2022-11-27 NOTE — Telephone Encounter (Signed)
Spoke to patient who stated that his hr has been between 51-55 bpm when checked by bp machine at home. Prior EKG's showed pt's hr in the lower to mid 50's. Patient was concerned about his hr since he is having a procedure in May.   Pt notified that he can start Repatha injections this week.   Patient had no further questions at this time.   Will fwd to provider as FYI.

## 2022-12-18 ENCOUNTER — Encounter: Payer: Self-pay | Admitting: Gastroenterology

## 2022-12-18 ENCOUNTER — Ambulatory Visit: Payer: Medicare HMO | Admitting: Gastroenterology

## 2022-12-18 VITALS — BP 129/81 | HR 50 | Temp 97.9°F | Ht 72.0 in | Wt 216.0 lb

## 2022-12-18 DIAGNOSIS — K648 Other hemorrhoids: Secondary | ICD-10-CM | POA: Insufficient documentation

## 2022-12-18 NOTE — Patient Instructions (Signed)
When you are ready to schedule hemorrhoid banding please call and ask to be scheduled with Lewie Loron, NP for "hemorrhoid banding".   You can stay on aspirin for hemorrhoid banding, but you cannot be on other blood thinners.   If you have any questions or concerns, please call.

## 2022-12-18 NOTE — Progress Notes (Signed)
GI Office Note    Referring Provider: Benita Stabile, MD Primary Care Physician:  Benita Stabile, MD  Primary Gastroenterologist: Roetta Sessions, MD   Chief Complaint   Chief Complaint  Patient presents with   Hemorrhoids    Intermittent bleeding when straining.    History of Present Illness   Jeffery Goodwin is a 72 y.o. male presenting today for further evaluation of hemorrhoids.   Intermittent flares with hemorrhoids few times per year. Sometimes associated with brbpr especially if he strains/gets into a hurry to have BM. BM regular. His last colonoscopy was 09/2017, noted to have grade 1 internal hemorrhoids at that time. He is due for screening colonoscopy in 09/2024 (due to FH of colon cancer).  He recalls Dr. Jena Gauss offered hemorrhoid banding after his colonoscopy but he did not pursue. He is interested at this time. No UGI symptoms or abdominal pain.   He is scheduled for knee replacement surgery on May 8th.       Medications   Current Outpatient Medications  Medication Sig Dispense Refill   aspirin EC 81 MG tablet Take 1 tablet (81 mg total) by mouth daily. Swallow whole. 90 tablet 3   cholecalciferol (VITAMIN D) 1000 units tablet Take 2,000 Units daily by mouth.      Cinnamon 500 MG capsule Take by mouth.     Evolocumab (REPATHA SURECLICK) 140 MG/ML SOAJ Inject 140 mg into the skin every 14 (fourteen) days. 2 mL 11   Flaxseed, Linseed, (FLAX SEEDS) POWD Take by mouth.     Misc Natural Products (GLUCOS-CHONDROIT-MSM COMPLEX PO) Take 3 tablets by mouth every morning.      Omega-3 Fatty Acids (FISH OIL) 1000 MG CPDR Take 2,000 mg by mouth daily.     Probiotic Product (PROBIOTIC-10 ULTIMATE PO) Take by mouth daily.     propranolol (INDERAL) 10 MG tablet Take 10 mg by mouth daily.     Saw Palmetto 450 MG CAPS Take 450 mg by mouth daily.     vitamin C (ASCORBIC ACID) 500 MG tablet Take 500 mg by mouth daily.     No current facility-administered medications for this visit.     Allergies   Allergies as of 12/18/2022 - Review Complete 12/18/2022  Allergen Reaction Noted   Rosuvastatin  03/08/2022   Asa [aspirin] Other (See Comments) 09/13/2014     Past Medical History   Past Medical History:  Diagnosis Date   Arthritis    Carpal tunnel syndrome    Coronary atherosclerosis of native coronary artery    DES mid LCx/OM2 12/2013   Dry eyes    GERD (gastroesophageal reflux disease)    History of shingles    Hyperlipidemia    Neck pain    Nocturia    Tremor    Right hand    Past Surgical History   Past Surgical History:  Procedure Laterality Date   ANTERIOR CERVICAL DECOMP/DISCECTOMY FUSION N/A 12/20/2014   Procedure: ANTERIOR CERVICAL DECOMPRESSION/DISCECTOMY FUSION 3 LEVELS;  Surgeon: [name], MD;  Location: [facility];  Service: Neurosurgery;  Laterality: N/A;  ANTERIOR CERVICAL DECOMPRESSION/DISCECTOMY FUSION 3 LEVELS C3-4,4-5,5-6   Arthroscopic left knee surgery  2005/2013   Carpal tunnel repair Right 1998   COLONOSCOPY     x2   COLONOSCOPY WITH PROPOFOL N/A 09/09/2017   Procedure: COLONOSCOPY WITH PROPOFOL;  Surgeon: [name], MD;  Location: [facility];  Service: Endoscopy;  Laterality: N/A;  8:30am   INTRAMEDULLARY (IM)  NAIL INTERTROCHANTERIC Left 09/13/2015   Procedure: INTRAMEDULLARY  NAIL INTERTROCHANTRIC;  Surgeon: [name], MD;  Location: [facility];  Service: Orthopedics;  Laterality: Left;   LEFT HEART CATHETERIZATION WITH CORONARY ANGIOGRAM N/A 12/11/2013   Procedure: LEFT HEART CATHETERIZATION WITH CORONARY ANGIOGRAM;  Surgeon: [name], MD;  Location: [facility] CATH LAB;  Service: Cardiovascular;  Laterality: N/A;   Left inguinal herniorrhaphy  2008    Past Family History   Family History  Problem Relation Age of Onset   Colon cancer Mother    Brain cancer Father     Past Social History   Social History   Socioeconomic History   Marital status: Married    Spouse name: Not on file   Number of children:  1   Years of education: Not on file   Highest education level: Not on file  Occupational History   Not on file  Tobacco Use   Smoking status: Former    Packs/day: 2.00    Years: 37.00    Additional pack years: 0.00    Total pack years: 74.00    Types: Cigarettes    Start date: 09/03/1972    Quit date: 09/03/2008    Years since quitting: 14.2   Smokeless tobacco: Never   Tobacco comments:    quit smoking 6 yrs ago.   Vaping Use   Vaping Use: Never used  Substance and Sexual Activity   Alcohol use: Yes    Alcohol/week: 2.0 standard drinks of alcohol    Types: 2 Cans of beer per week    Comment: 2 beers a day   Drug use: No   Sexual activity: Yes    Birth control/protection: None  Other Topics Concern   Not on file  Social History Narrative   Self-employed as a Psychologist, occupational. Remains active including swimming and biking         Social Determinants of Health   Financial Resource Strain: Not on file  Food Insecurity: Not on file  Transportation Needs: Not on file  Physical Activity: Not on file  Stress: Not on file  Social Connections: Not on file  Intimate Partner Violence: Not on file    Review of Systems   General: Negative for anorexia, weight loss, fever, chills, fatigue, weakness. ENT: Negative for hoarseness, difficulty swallowing , nasal congestion. CV: Negative for chest pain, angina, palpitations, dyspnea on exertion, peripheral edema.  Respiratory: Negative for dyspnea at rest, dyspnea on exertion, cough, sputum, wheezing.  GI: See history of present illness. GU:  Negative for dysuria, hematuria, urinary incontinence, urinary frequency, nocturnal urination.  Endo: Negative for unusual weight change.     Physical Exam   BP 129/81 (BP Location: Right Arm, Patient Position: Sitting, Cuff Size: Large)   Pulse (!) 50   Temp 97.9 F (36.6 C) (Oral)   Ht 6' (1.829 m)   Wt 216 lb (98 kg)   SpO2 96%   BMI 29.29 kg/m    General: Well-nourished, well-developed in  no acute distress.  Eyes: No icterus. Mouth: Oropharyngeal mucosa moist and pink  Lungs: Clear to auscultation bilaterally.  Heart: Regular rate and rhythm, no murmurs rubs or gallops.  Abdomen: Bowel sounds are normal, nontender, nondistended, no hepatosplenomegaly or masses,  no abdominal bruits or hernia , no rebound or guarding.  Rectal: deferred until hemorrhoid banding Extremities: No lower extremity edema. No clubbing or deformities. Neuro: Alert and oriented x 4   Skin: Warm and dry, no jaundice.   Psych: Alert  and cooperative, normal mood and affect.  Labs   None available  Imaging Studies   No results found.  Assessment/Plan:   Internal hemorrhoids: intermittent flares with brbpr with straining. Increased frequency and desires definitive treatment. Not interested in prescription creams. He has upcoming knee replacement surgery next month. We discussed that intermittent brbpr likely due to hemorrhoids but cannot exclude other etiologies. Last colonoscopy five years ago. At this time with pending surgery, he would not be able to complete colonoscopy until he has fully recovered. He may elect to pursue hemorrhoid banding in the interim if he does not require anticoagulation. Discussed importance of updating colonoscopy if ongoing brbpr after banding completed. Discussed [facility] banding, handout provided. Patient will call if he wants to proceed.     Leanna Battles. Melvyn Neth, MHS, PA-C [facility]

## 2022-12-20 DIAGNOSIS — D225 Melanocytic nevi of trunk: Secondary | ICD-10-CM | POA: Diagnosis not present

## 2022-12-20 DIAGNOSIS — L570 Actinic keratosis: Secondary | ICD-10-CM | POA: Diagnosis not present

## 2022-12-20 DIAGNOSIS — X32XXXA Exposure to sunlight, initial encounter: Secondary | ICD-10-CM | POA: Diagnosis not present

## 2022-12-20 DIAGNOSIS — Z1283 Encounter for screening for malignant neoplasm of skin: Secondary | ICD-10-CM | POA: Diagnosis not present

## 2023-01-09 DIAGNOSIS — G8918 Other acute postprocedural pain: Secondary | ICD-10-CM | POA: Diagnosis not present

## 2023-01-09 DIAGNOSIS — M1712 Unilateral primary osteoarthritis, left knee: Secondary | ICD-10-CM | POA: Diagnosis not present

## 2023-01-16 DIAGNOSIS — M25662 Stiffness of left knee, not elsewhere classified: Secondary | ICD-10-CM | POA: Diagnosis not present

## 2023-01-16 DIAGNOSIS — M25562 Pain in left knee: Secondary | ICD-10-CM | POA: Diagnosis not present

## 2023-01-18 DIAGNOSIS — M25562 Pain in left knee: Secondary | ICD-10-CM | POA: Diagnosis not present

## 2023-01-18 DIAGNOSIS — M25662 Stiffness of left knee, not elsewhere classified: Secondary | ICD-10-CM | POA: Diagnosis not present

## 2023-01-21 DIAGNOSIS — M25562 Pain in left knee: Secondary | ICD-10-CM | POA: Diagnosis not present

## 2023-01-21 DIAGNOSIS — M25662 Stiffness of left knee, not elsewhere classified: Secondary | ICD-10-CM | POA: Diagnosis not present

## 2023-01-24 DIAGNOSIS — M25562 Pain in left knee: Secondary | ICD-10-CM | POA: Diagnosis not present

## 2023-01-24 DIAGNOSIS — M25662 Stiffness of left knee, not elsewhere classified: Secondary | ICD-10-CM | POA: Diagnosis not present

## 2023-01-29 DIAGNOSIS — M25562 Pain in left knee: Secondary | ICD-10-CM | POA: Diagnosis not present

## 2023-01-29 DIAGNOSIS — M25662 Stiffness of left knee, not elsewhere classified: Secondary | ICD-10-CM | POA: Diagnosis not present

## 2023-01-31 DIAGNOSIS — M25662 Stiffness of left knee, not elsewhere classified: Secondary | ICD-10-CM | POA: Diagnosis not present

## 2023-01-31 DIAGNOSIS — M25562 Pain in left knee: Secondary | ICD-10-CM | POA: Diagnosis not present

## 2023-02-04 DIAGNOSIS — M25562 Pain in left knee: Secondary | ICD-10-CM | POA: Diagnosis not present

## 2023-02-04 DIAGNOSIS — M25662 Stiffness of left knee, not elsewhere classified: Secondary | ICD-10-CM | POA: Diagnosis not present

## 2023-02-05 ENCOUNTER — Telehealth: Payer: Self-pay | Admitting: Cardiology

## 2023-02-05 NOTE — Telephone Encounter (Signed)
Spoke to patient who stated he was a little concerned with hr/bp. Advised pt that bp was fine and hr was consistent with what the office receives when he is here. Pt stated that he stopped drinking beers 6 weeks- pt advised that this could also help lower his bp. Pt just wanted provider to be informed.   FYI

## 2023-02-05 NOTE — Telephone Encounter (Signed)
Pt c/o BP issue: STAT if pt c/o blurred vision, one-sided weakness or slurred speech  1. What are your last 5 BP readings?  111/49 107/63 56 116/65 51 110/56 53  2. Are you having any other symptoms (ex. Dizziness, headache, blurred vision, passed out)?  No   3. What is your BP issue?  Patient states over the past few days his BP/HR have been low.

## 2023-02-06 DIAGNOSIS — M25562 Pain in left knee: Secondary | ICD-10-CM | POA: Diagnosis not present

## 2023-02-06 DIAGNOSIS — M25662 Stiffness of left knee, not elsewhere classified: Secondary | ICD-10-CM | POA: Diagnosis not present

## 2023-02-08 DIAGNOSIS — M25662 Stiffness of left knee, not elsewhere classified: Secondary | ICD-10-CM | POA: Diagnosis not present

## 2023-02-08 DIAGNOSIS — M25562 Pain in left knee: Secondary | ICD-10-CM | POA: Diagnosis not present

## 2023-02-11 DIAGNOSIS — M25662 Stiffness of left knee, not elsewhere classified: Secondary | ICD-10-CM | POA: Diagnosis not present

## 2023-02-11 DIAGNOSIS — M25562 Pain in left knee: Secondary | ICD-10-CM | POA: Diagnosis not present

## 2023-02-12 DIAGNOSIS — Z5189 Encounter for other specified aftercare: Secondary | ICD-10-CM | POA: Diagnosis not present

## 2023-02-15 DIAGNOSIS — M25662 Stiffness of left knee, not elsewhere classified: Secondary | ICD-10-CM | POA: Diagnosis not present

## 2023-02-15 DIAGNOSIS — M25562 Pain in left knee: Secondary | ICD-10-CM | POA: Diagnosis not present

## 2023-02-19 DIAGNOSIS — M25562 Pain in left knee: Secondary | ICD-10-CM | POA: Diagnosis not present

## 2023-02-19 DIAGNOSIS — M25662 Stiffness of left knee, not elsewhere classified: Secondary | ICD-10-CM | POA: Diagnosis not present

## 2023-02-22 DIAGNOSIS — M25562 Pain in left knee: Secondary | ICD-10-CM | POA: Diagnosis not present

## 2023-02-22 DIAGNOSIS — M25662 Stiffness of left knee, not elsewhere classified: Secondary | ICD-10-CM | POA: Diagnosis not present

## 2023-02-26 DIAGNOSIS — M25662 Stiffness of left knee, not elsewhere classified: Secondary | ICD-10-CM | POA: Diagnosis not present

## 2023-02-26 DIAGNOSIS — M25562 Pain in left knee: Secondary | ICD-10-CM | POA: Diagnosis not present

## 2023-03-01 DIAGNOSIS — M25662 Stiffness of left knee, not elsewhere classified: Secondary | ICD-10-CM | POA: Diagnosis not present

## 2023-03-01 DIAGNOSIS — M25562 Pain in left knee: Secondary | ICD-10-CM | POA: Diagnosis not present

## 2023-03-04 DIAGNOSIS — M25562 Pain in left knee: Secondary | ICD-10-CM | POA: Diagnosis not present

## 2023-03-04 DIAGNOSIS — M25662 Stiffness of left knee, not elsewhere classified: Secondary | ICD-10-CM | POA: Diagnosis not present

## 2023-03-06 DIAGNOSIS — M25562 Pain in left knee: Secondary | ICD-10-CM | POA: Diagnosis not present

## 2023-03-06 DIAGNOSIS — M25662 Stiffness of left knee, not elsewhere classified: Secondary | ICD-10-CM | POA: Diagnosis not present

## 2023-03-12 DIAGNOSIS — M25662 Stiffness of left knee, not elsewhere classified: Secondary | ICD-10-CM | POA: Diagnosis not present

## 2023-03-12 DIAGNOSIS — M25562 Pain in left knee: Secondary | ICD-10-CM | POA: Diagnosis not present

## 2023-03-13 DIAGNOSIS — I25119 Atherosclerotic heart disease of native coronary artery with unspecified angina pectoris: Secondary | ICD-10-CM | POA: Diagnosis not present

## 2023-03-13 LAB — LIPID PANEL
Cholesterol: 110 mg/dL (ref <200)
HDL: 62 mg/dL (ref >=40)
LDL Cholesterol (Calc): 34 mg/dL (calc)
Non-HDL Cholesterol (Calc): 48 mg/dL (calc) (ref <130)
Total CHOL/HDL Ratio: 1.8 (calc) (ref <5.0)
Triglycerides: 59 mg/dL (ref <150)

## 2023-03-15 DIAGNOSIS — M25662 Stiffness of left knee, not elsewhere classified: Secondary | ICD-10-CM | POA: Diagnosis not present

## 2023-03-15 DIAGNOSIS — M25562 Pain in left knee: Secondary | ICD-10-CM | POA: Diagnosis not present

## 2023-03-21 ENCOUNTER — Encounter: Payer: Self-pay | Admitting: Cardiology

## 2023-03-21 ENCOUNTER — Ambulatory Visit: Payer: Medicare HMO | Attending: Cardiology | Admitting: Cardiology

## 2023-03-21 VITALS — BP 140/84 | HR 62 | Ht 73.0 in | Wt 211.0 lb

## 2023-03-21 DIAGNOSIS — I25119 Atherosclerotic heart disease of native coronary artery with unspecified angina pectoris: Secondary | ICD-10-CM

## 2023-03-21 DIAGNOSIS — E782 Mixed hyperlipidemia: Secondary | ICD-10-CM

## 2023-03-21 DIAGNOSIS — M791 Myalgia, unspecified site: Secondary | ICD-10-CM

## 2023-03-21 DIAGNOSIS — T466X5D Adverse effect of antihyperlipidemic and antiarteriosclerotic drugs, subsequent encounter: Secondary | ICD-10-CM

## 2023-03-21 NOTE — Progress Notes (Signed)
    Cardiology Office Note  Date: 03/21/2023   ID: Jeffery Goodwin, Jeffery Goodwin Nov 25, 1950, MRN [id number]  History of Present Illness: Jeffery Goodwin is a 72 y.o. male last seen in December 2023.  He is here for a follow-up visit.  He is still recuperating from interval left knee replacement with Dr. Lequita Halt.  Using a cane and trying to increase his activity.  From a cardiac perspective, he does not report any angina or worsening shortness of breath with current level of activity.  No palpitations or syncope.  We did go over his home blood pressure and heart rate checks which look reasonable.  Today's blood pressure was elevated in the setting of discomfort walking in.  We reviewed his medications, he continues on Repatha with excellent lipid control, also low-dose aspirin.  Recent LDL 34.  Physical Exam: VS:  BP (!) 140/84   Pulse 62   Ht 6\' 1"  (1.854 m)   Wt 211 lb (95.7 kg)   SpO2 97%   BMI 27.84 kg/m , BMI Body mass index is 27.84 kg/m.  Wt Readings from Last 3 Encounters:  03/21/23 211 lb (95.7 kg)  12/18/22 216 lb (98 kg)  08/23/22 215 lb 6.4 oz (97.7 kg)    General: Patient appears comfortable at rest. HEENT: Conjunctiva and lids normal. Neck: Supple, no elevated JVP or carotid bruits. Lungs: Clear to auscultation, nonlabored breathing at rest. Cardiac: Regular rate and rhythm, no S3 or significant systolic murmur.  ECG:  An ECG dated 08/23/2022 was personally reviewed today and demonstrated:  Sinus bradycardia with nonspecific ST-T changes and increased voltage.  Labwork: October 2023: Hemoglobin 13.7, platelets 180, BUN 24, creatinine 0.9, potassium 4.4, AST 24, ALT 16, hemoglobin A1c 5.5%    Component Value Date/Time   CHOL 110 03/13/2023 0901   TRIG 59 03/13/2023 0901   HDL 62 03/13/2023 0901   CHOLHDL 1.8 03/13/2023 0901   VLDL 7 05/18/2020 1255   LDLCALC 34 03/13/2023 0901   Other Studies Reviewed Today:  Echocardiogram 01/03/2022:  1. Left ventricular ejection  fraction, by estimation, is 50 to 55%. The  left ventricle has low normal function. The left ventricle has no regional  wall motion abnormalities. The left ventricular internal cavity size was  mildly dilated. There is mild  left ventricular hypertrophy. Left ventricular diastolic parameters are  indeterminate.   2. Right ventricular systolic function is normal. The right ventricular  size is normal. Tricuspid regurgitation signal is inadequate for assessing  PA pressure.   3. Left atrial size was mildly dilated.   4. The mitral valve is normal in structure. Trivial mitral valve  regurgitation. No evidence of mitral stenosis.   5. The aortic valve is tricuspid. Aortic valve regurgitation is not  visualized. No aortic stenosis is present.   6. The inferior vena cava is normal in size with greater than 50%  respiratory variability, suggesting right atrial pressure of 3 mmHg.   Assessment and Plan:  1.  CAD status post DES to the mid circumflex/OM 2 in 2015.  LVEF 50 to 55% by echocardiogram in May 2023.  He remains symptomatically stable without active angina and continues on aspirin along with Repatha.  Continue observation.  2.  Mixed hyperlipidemia with statin intolerance.  He is on Repatha with excellent lipid control, recent lab work showing LDL 34 and HDL 62.  Disposition:  Follow up  6 months.  Signed, Jonelle Sidle, M.D., F.A.C.C. [facility] at [facility]

## 2023-03-21 NOTE — Patient Instructions (Signed)
Medication Instructions:  Your physician recommends that you continue on your current medications as directed. Please refer to the Current Medication list given to you today.   Labwork: None today  Testing/Procedures: None today  Follow-Up: 6 months  Any Other Special Instructions Will Be Listed Below (If Applicable).  If you need a refill on your cardiac medications before your next appointment, please call your pharmacy.  

## 2023-04-04 DIAGNOSIS — M47812 Spondylosis without myelopathy or radiculopathy, cervical region: Secondary | ICD-10-CM | POA: Diagnosis not present

## 2023-04-04 DIAGNOSIS — M4802 Spinal stenosis, cervical region: Secondary | ICD-10-CM | POA: Diagnosis not present

## 2023-06-06 ENCOUNTER — Ambulatory Visit: Payer: Medicare HMO | Admitting: Gastroenterology

## 2023-06-06 ENCOUNTER — Encounter: Payer: Self-pay | Admitting: Gastroenterology

## 2023-06-06 VITALS — BP 119/75 | HR 48 | Temp 98.3°F | Ht 70.0 in | Wt 221.2 lb

## 2023-06-06 DIAGNOSIS — K648 Other hemorrhoids: Secondary | ICD-10-CM | POA: Diagnosis not present

## 2023-06-06 NOTE — Patient Instructions (Signed)
  Please avoid straining.  You should limit your toilet time to 2-3 minutes at the most.   Continue supplemental fiber as you are doing!  Please call me with any concerns or issues!  I will see you in follow-up for additional banding in several weeks.     It was a pleasure to see you today. I want to create trusting relationships with patients and provide genuine, compassionate, and quality care. I truly value your feedback, so please be on the lookout for a survey regarding your visit with me today. I appreciate your time in completing this!         Gelene Mink, PhD, ANP-BC [facility]

## 2023-06-06 NOTE — Progress Notes (Signed)
       [facility] BANDING PROCEDURE NOTE  Jeffery Goodwin is a 72 y.o. male presenting today for consideration of hemorrhoid banding. Last colonoscopy Jan 2019 with internal hemorrhoids. He has had persistent rectal bleeding. FH colon cancer in mother but at advanced age. Next colonoscopy has been recommended for 2026.    The patient presents with symptomatic grade 2 hemorrhoids, unresponsive to maximal medical therapy, requesting rubber band ligation of his hemorrhoidal disease. All risks, benefits, and alternative forms of therapy were described and informed consent was obtained.  In the left lateral decubitus position, anoscopic examination revealed grade 2 hemorrhoids in all positions.   The decision was made to band the left lateral internal hemorrhoid, and the [facility] O'Regan System was used to perform band ligation without complication. Digital anorectal examination was then performed to assure proper positioning of the band, and to adjust the banded tissue as required. The patient was discharged home without pain or other issues. Dietary and behavioral recommendations were given, along with follow-up instructions. The patient will return in several weeks for followup and possible additional banding as required.  No complications were encountered and the patient tolerated the procedure well.   Gelene Mink, PhD, ANP-BC [facility]

## 2023-06-20 DIAGNOSIS — Z96652 Presence of left artificial knee joint: Secondary | ICD-10-CM | POA: Diagnosis not present

## 2023-06-20 DIAGNOSIS — Z5189 Encounter for other specified aftercare: Secondary | ICD-10-CM | POA: Diagnosis not present

## 2023-06-25 ENCOUNTER — Ambulatory Visit: Payer: Medicare HMO | Admitting: Gastroenterology

## 2023-06-25 ENCOUNTER — Encounter: Payer: Self-pay | Admitting: Gastroenterology

## 2023-06-25 VITALS — BP 128/74 | HR 45 | Temp 97.9°F | Ht 70.0 in | Wt 222.6 lb

## 2023-06-25 DIAGNOSIS — K641 Second degree hemorrhoids: Secondary | ICD-10-CM

## 2023-06-25 DIAGNOSIS — K648 Other hemorrhoids: Secondary | ICD-10-CM

## 2023-06-25 NOTE — Patient Instructions (Signed)
  Please avoid straining.  You should limit your toilet time to 2-3 minutes at the most.   I recommend Benefiber 2 teaspoons each morning in the beverage of your choice!  Please call me with any concerns or issues!  I enjoyed seeing you again today! I value our relationship and want to provide genuine, compassionate, and quality care. You may receive a survey regarding your visit with me, and I welcome your feedback! Thanks so much for taking the time to complete this. I look forward to seeing you again.      Gelene Mink, PhD, ANP-BC [facility]

## 2023-06-25 NOTE — Progress Notes (Signed)
      [facility] BANDING PROCEDURE NOTE  Jeffery Goodwin is a 72 y.o. male presenting today for consideration of hemorrhoid banding. Last colonoscopy an 2019 with internal hemorrhoids. He has had persistent rectal bleeding. FH colon cancer in mother but at advanced age. Next colonoscopy has been recommended for 2026. Left lateral banding has been completed. He has had stuttering low volume hematochezia since last colonoscopy and even prior.    The patient presents with symptomatic grade 2 hemorrhoids, unresponsive to maximal medical therapy, requesting rubber band ligation of his hemorrhoidal disease. All risks, benefits, and alternative forms of therapy were described and informed consent was obtained.   The decision was made to band the right anterior and posterior internal hemorrhoid, and the [facility] O'Regan System was used to perform band ligation without complication. Digital anorectal examination was then performed to assure proper positioning of the band, and to adjust the banded tissue as required. The patient was discharged home without pain or other issues. Dietary and behavioral recommendations were given, along with follow-up instructions. The patient will return as needed. If further bleeding, recommend colonoscopy.   No complications were encountered and the patient tolerated the procedure well.   Gelene Mink, PhD, ANP-BC [facility]

## 2023-07-04 ENCOUNTER — Other Ambulatory Visit (HOSPITAL_COMMUNITY): Payer: Self-pay | Admitting: Family Medicine

## 2023-07-04 DIAGNOSIS — Z87891 Personal history of nicotine dependence: Secondary | ICD-10-CM

## 2023-08-11 ENCOUNTER — Other Ambulatory Visit: Payer: Self-pay | Admitting: Cardiology

## 2023-08-22 ENCOUNTER — Ambulatory Visit (HOSPITAL_COMMUNITY)
Admission: RE | Admit: 2023-08-22 | Discharge: 2023-08-22 | Disposition: A | Discharge status: Home / Self Care | Payer: Medicare HMO | Source: Ambulatory Visit | Attending: Family Medicine | Admitting: Family Medicine

## 2023-08-22 DIAGNOSIS — Z87891 Personal history of nicotine dependence: Secondary | ICD-10-CM | POA: Diagnosis not present

## 2023-09-10 ENCOUNTER — Telehealth: Payer: Self-pay | Admitting: Cardiology

## 2023-09-10 DIAGNOSIS — I25119 Atherosclerotic heart disease of native coronary artery with unspecified angina pectoris: Secondary | ICD-10-CM

## 2023-09-10 DIAGNOSIS — H43393 Other vitreous opacities, bilateral: Secondary | ICD-10-CM | POA: Diagnosis not present

## 2023-09-10 DIAGNOSIS — H52223 Regular astigmatism, bilateral: Secondary | ICD-10-CM | POA: Diagnosis not present

## 2023-09-10 DIAGNOSIS — E782 Mixed hyperlipidemia: Secondary | ICD-10-CM

## 2023-09-10 DIAGNOSIS — H524 Presbyopia: Secondary | ICD-10-CM | POA: Diagnosis not present

## 2023-09-10 NOTE — Telephone Encounter (Signed)
 Patient is requesting orders for labs to be picked up in [place]. Would like a call to let him know when to come.

## 2023-09-10 NOTE — Telephone Encounter (Signed)
 Pt is requesting to have lipid panel drawn prior to his 1/20 visit with provider. Please advise.    Lab: [facility]

## 2023-09-10 NOTE — Telephone Encounter (Signed)
 Patient notified and verbalized understanding okay to order lab per provider. Pt had no questions or concerns at this time.

## 2023-09-11 DIAGNOSIS — E782 Mixed hyperlipidemia: Secondary | ICD-10-CM | POA: Diagnosis not present

## 2023-09-11 DIAGNOSIS — I25119 Atherosclerotic heart disease of native coronary artery with unspecified angina pectoris: Secondary | ICD-10-CM | POA: Diagnosis not present

## 2023-09-12 LAB — LIPID PANEL
Chol/HDL Ratio: 1.9 {ratio} (ref 0.0–5.0)
Cholesterol, Total: 125 mg/dL (ref 100–199)
HDL: 65 mg/dL (ref >39)
LDL Chol Calc (NIH): 49 mg/dL (ref 0–99)
Triglycerides: 46 mg/dL (ref 0–149)
VLDL Cholesterol Cal: 11 mg/dL (ref 5–40)

## 2023-09-23 ENCOUNTER — Ambulatory Visit: Payer: Medicare HMO | Attending: Cardiology | Admitting: Cardiology

## 2023-09-23 ENCOUNTER — Encounter: Payer: Self-pay | Admitting: Cardiology

## 2023-09-23 VITALS — BP 122/80 | HR 46 | Ht 70.0 in | Wt 220.6 lb

## 2023-09-23 DIAGNOSIS — M791 Myalgia, unspecified site: Secondary | ICD-10-CM

## 2023-09-23 DIAGNOSIS — T466X5D Adverse effect of antihyperlipidemic and antiarteriosclerotic drugs, subsequent encounter: Secondary | ICD-10-CM

## 2023-09-23 DIAGNOSIS — T466X5A Adverse effect of antihyperlipidemic and antiarteriosclerotic drugs, initial encounter: Secondary | ICD-10-CM

## 2023-09-23 DIAGNOSIS — I25119 Atherosclerotic heart disease of native coronary artery with unspecified angina pectoris: Secondary | ICD-10-CM | POA: Diagnosis not present

## 2023-09-23 DIAGNOSIS — E782 Mixed hyperlipidemia: Secondary | ICD-10-CM | POA: Diagnosis not present

## 2023-09-23 NOTE — Progress Notes (Signed)
    Cardiology Office Note  Date: 09/23/2023   ID: Virl, Donson [birth year], MRN [id number]  History of Present Illness: Jeffery Goodwin is a 73 y.o. male last seen in July 2024.  He is here for a routine visit.  Reports no specific change in stamina, knee discomfort has improved following prior surgery.  He is back to spinning, weights, and yoga.  He did go on a 4 mile hike with his family members around the holidays, had some potential angina that was nonlimiting.  I reviewed his medications.  He remains on aspirin and Repatha.  Recent follow-up lab work showed LDL 49 and HDL 65.  I reviewed his ECG today which shows sinus bradycardia with PACs, left anterior fascicular block and increased voltage.  120 at home  Physical Exam: VS:  BP 122/80   Pulse (!) 46   Ht 5\' 10"  (1.778 m)   Wt 220 lb 9.6 oz (100.1 kg)   SpO2 100%   BMI 31.65 kg/m , BMI Body mass index is 31.65 kg/m.  Wt Readings from Last 3 Encounters:  09/23/23 220 lb 9.6 oz (100.1 kg)  06/25/23 222 lb 9.6 oz (101 kg)  06/06/23 221 lb 3.2 oz (100.3 kg)    General: Patient appears comfortable at rest. HEENT: Conjunctiva and lids normal. Neck: Supple, no elevated JVP or carotid bruits. Lungs: Clear to auscultation, nonlabored breathing at rest. Cardiac: Regular rate and rhythm, no S3 or significant systolic murmur.  ECG:  An ECG dated 08/23/2022 was personally reviewed today and demonstrated:  Sinus bradycardia with nonspecific ST-T changes and increased voltage.  Labwork:    Component Value Date/Time   CHOL 125 09/11/2023 0946   TRIG 46 09/11/2023 0946   HDL 65 09/11/2023 0946   CHOLHDL 1.9 09/11/2023 0946   CHOLHDL 1.8 03/13/2023 0901   VLDL 7 05/18/2020 1255   LDLCALC 49 09/11/2023 0946   LDLCALC 34 03/13/2023 0901   Other Studies Reviewed Today:  No interval cardiac testing for review today.  Assessment and Plan:  1.  CAD status post DES to the mid circumflex/OM 2 in 2015.  LVEF 50 to 55% by  echocardiogram in May 2023.  We will continue observation, discussed warning signs and symptoms.  He continues with a regular exercise plan.  ECG reviewed.  Continue aspirin and Repatha.   2.  Mixed hyperlipidemia with statin intolerance.  Recent LDL 49 and HDL 65, continues on Repatha.  Disposition:  Follow up  6 months.  Signed, Jeffery Goodwin, M.D., F.A.C.C. [facility] at [facility]

## 2023-09-23 NOTE — Patient Instructions (Signed)
Medication Instructions:   Continue all current medications.   Labwork:  FLP - will be due just prior to next visit   Testing/Procedures:  none  Follow-Up:  6 months   Any Other Special Instructions Will Be Listed Below (If Applicable).   If you need a refill on your cardiac medications before your next appointment, please call your pharmacy.

## 2023-10-02 ENCOUNTER — Ambulatory Visit
Admission: EM | Admit: 2023-10-02 | Discharge: 2023-10-02 | Disposition: A | Discharge status: Home / Self Care | Payer: Medicare HMO | Attending: Family Medicine | Admitting: Family Medicine

## 2023-10-02 DIAGNOSIS — M7989 Other specified soft tissue disorders: Secondary | ICD-10-CM

## 2023-10-02 MED ORDER — DEXAMETHASONE SODIUM PHOSPHATE 10 MG/ML IJ SOLN
10.0000 mg | Freq: Once | INTRAMUSCULAR | Status: AC
  Administered 2023-10-02: 10 mg via INTRAMUSCULAR

## 2023-10-02 NOTE — ED Provider Notes (Signed)
[facility]    CSN: [id number] Arrival date & time: 10/02/23  1430      History   Chief Complaint No chief complaint on file.   HPI Jeffery Goodwin is a 73 y.o. male.   Presenting today with 5-day history of diffuse bilateral hand edema, tenderness and stiffness following doing a significant amount of carpentry work over the past week or so.  Denies numbness, tingling, loss of range of motion, injury, fever, chills.  States this is not uncommon for him with significant physical activity but that usually baby aspirin and rest, Tylenol seem to resolve the issue.  Does have a history of arthritis.    Past Medical History:  Diagnosis Date   Arthritis    Carpal tunnel syndrome    Coronary atherosclerosis of native coronary artery    DES mid LCx/OM2 12/2013   Dry eyes    GERD (gastroesophageal reflux disease)    History of shingles    Hyperlipidemia    Neck pain    Nocturia    Tremor    Right hand    Patient Active Problem List   Diagnosis Date Noted   Internal hemorrhoid 12/18/2022   Intertrochanteric fracture of left femur (HCC) 09/13/2015   Intertrochanteric fracture of left hip (HCC)    Hip fracture (HCC) 09/12/2015   Spinal stenosis in cervical region 12/20/2014   Cervical herniated disc 12/20/2014   History of elevated lipids 05/06/2014   Elevated blood pressure 12/17/2013   Abnormal myocardial perfusion study 12/07/2013   Coronary atherosclerosis of native coronary artery 11/23/2013   TREMOR, ESSENTIAL, RIGHT HAND 06/02/2009    Past Surgical History:  Procedure Laterality Date   ANTERIOR CERVICAL DECOMP/DISCECTOMY FUSION N/A 12/20/2014   Procedure: ANTERIOR CERVICAL DECOMPRESSION/DISCECTOMY FUSION 3 LEVELS;  Surgeon: [name], MD;  Location: [facility];  Service: Neurosurgery;  Laterality: N/A;  ANTERIOR CERVICAL DECOMPRESSION/DISCECTOMY FUSION 3 LEVELS C3-4,4-5,5-6   Arthroscopic left knee surgery  2005/2013   Carpal tunnel repair Right 1998    COLONOSCOPY     x2   COLONOSCOPY WITH PROPOFOL N/A 09/09/2017   Procedure: COLONOSCOPY WITH PROPOFOL;  Surgeon: [name], MD;  Location: [facility];  Service: Endoscopy;  Laterality: N/A;  8:30am   INTRAMEDULLARY (IM) NAIL INTERTROCHANTERIC Left 09/13/2015   Procedure: INTRAMEDULLARY  NAIL INTERTROCHANTRIC;  Surgeon: [name], MD;  Location: [facility];  Service: Orthopedics;  Laterality: Left;   LEFT HEART CATHETERIZATION WITH CORONARY ANGIOGRAM N/A 12/11/2013   Procedure: LEFT HEART CATHETERIZATION WITH CORONARY ANGIOGRAM;  Surgeon: [name], MD;  Location: [facility] CATH LAB;  Service: Cardiovascular;  Laterality: N/A;   Left inguinal herniorrhaphy  2008       Home Medications    Prior to Admission medications   Medication Sig Start Date End Date Taking? Authorizing Provider  aspirin EC 81 MG tablet Take 1 tablet (81 mg total) by mouth daily. Swallow whole. 03/17/20   Strader, Lennart Pall, PA-C  calcium-vitamin D (OSCAL WITH D) 500-5 MG-MCG tablet Take 1 tablet by mouth.    [provider]  cholecalciferol (VITAMIN D) 1000 units tablet Take 2,000 Units daily by mouth.     [provider]  Evolocumab (REPATHA SURECLICK) 140 MG/ML SOAJ INJECT 140 MG INTO THE SKIN EVERY 14 (FOURTEEN) DAYS. 08/12/23   Jonelle Sidle, MD  Flaxseed, Linseed, (FLAX SEEDS) POWD Take by mouth. 08/14/17   [provider]  Omega-3 Fatty Acids (FISH OIL) 1000 MG CPDR Take 2,000 mg  by mouth daily. 09/05/21   Jonelle Sidle, MD  Probiotic Product (PROBIOTIC-10 ULTIMATE PO) Take by mouth daily.    [provider]  propranolol (INDERAL) 10 MG tablet Take 10 mg by mouth daily.    [provider]  vitamin C (ASCORBIC ACID) 500 MG tablet Take 500 mg by mouth daily.    [provider]    Family History Family History  Problem Relation Age of Onset   Colon cancer Mother    Brain cancer Father     Social History Social History   Tobacco Use    Smoking status: Former    Current packs/day: 0.00    Average packs/day: 2.0 packs/day for 37.0 years (74.0 ttl pk-yrs)    Types: Cigarettes    Start date: 09/03/1972    Quit date: 09/03/2008    Years since quitting: 15.0   Smokeless tobacco: Never   Tobacco comments:    quit smoking 6 yrs ago.   Vaping Use   Vaping status: Never Used  Substance Use Topics   Alcohol use: Yes    Alcohol/week: 2.0 standard drinks of alcohol    Types: 2 Cans of beer per week    Comment: 2 beers a day   Drug use: No     Allergies   Rosuvastatin and Asa [aspirin]   Review of Systems Review of Systems PER HPI  Physical Exam Triage Vital Signs ED Triage Vitals  Encounter Vitals Group     BP 10/02/23 1508 131/77     Systolic BP Percentile --      Diastolic BP Percentile --      Pulse Rate 10/02/23 1508 65     Resp 10/02/23 1508 18     Temp 10/02/23 1508 98.3 F (36.8 C)     Temp Source 10/02/23 1508 Oral     SpO2 10/02/23 1508 95 %     Weight --      Height --      Head Circumference --      Peak Flow --      Pain Score 10/02/23 1509 0     Pain Loc --      Pain Education --      Exclude from Growth Chart --    No data found.  Updated Vital Signs BP 131/77 (BP Location: Right Arm)   Pulse 65   Temp 98.3 F (36.8 C) (Oral)   Resp 18   SpO2 95%   Visual Acuity Right Eye Distance:   Left Eye Distance:   Bilateral Distance:    Right Eye Near:   Left Eye Near:    Bilateral Near:     Physical Exam Vitals and nursing note reviewed.  Constitutional:      Appearance: Normal appearance.  HENT:     Head: Atraumatic.     Mouth/Throat:     Mouth: Mucous membranes are moist.  Eyes:     Extraocular Movements: Extraocular movements intact.     Conjunctiva/sclera: Conjunctivae normal.  Cardiovascular:     Rate and Rhythm: Normal rate.  Pulmonary:     Effort: Pulmonary effort is normal.  Musculoskeletal:        General: Swelling and tenderness present. No deformity or signs of  injury. Normal range of motion.     Cervical back: Normal range of motion and neck supple.     Comments: Bilateral diffuse hand edema worse on the right.  Range of motion intact, nontender to palpation  Skin:  General: Skin is warm and dry.     Findings: No bruising, erythema, lesion or rash.  Neurological:     General: No focal deficit present.     Mental Status: He is oriented to person, place, and time.     Motor: No weakness.     Gait: Gait normal.     Comments: Bilateral upper extremities neurovascularly intact  Psychiatric:        Mood and Affect: Mood normal.        Thought Content: Thought content normal.        Judgment: Judgment normal.    UC Treatments / Results  Labs (all labs ordered are listed, but only abnormal results are displayed) Labs Reviewed - No data to display  EKG   Radiology No results found.  Procedures Procedures (including critical care time)  Medications Ordered in UC Medications  dexamethasone (DECADRON) injection 10 mg (10 mg Intramuscular Given 10/02/23 1541)    Initial Impression / Assessment and Plan / UC Course  I have reviewed the triage vital signs and the nursing notes.  Pertinent labs & imaging results that were available during my care of the patient were reviewed by me and considered in my medical decision making (see chart for details).     Suspect arthritic in nature due to significant physical activity.  Because over-the-counter measures are not significantly improving symptoms, will start IM Decadron in clinic and do Epsom salt soaks, hand elevation.  Return for worsening symptoms.  Final Clinical Impressions(s) / UC Diagnoses   Final diagnoses:  Bilateral hand swelling   Discharge Instructions   None    ED Prescriptions   None    PDMP not reviewed this encounter.   Particia Nearing, [place] 10/02/23 1546

## 2023-10-02 NOTE — ED Triage Notes (Signed)
Pt reports to UC with swollen hands  x 5 days   States he was doing some carpentry around the house and noticed his hands became swollen thereafter.

## 2023-10-10 ENCOUNTER — Ambulatory Visit
Admission: EM | Admit: 2023-10-10 | Discharge: 2023-10-10 | Disposition: A | Discharge status: Home / Self Care | Payer: Medicare HMO | Attending: Nurse Practitioner | Admitting: Nurse Practitioner

## 2023-10-10 DIAGNOSIS — R2231 Localized swelling, mass and lump, right upper limb: Secondary | ICD-10-CM

## 2023-10-10 MED ORDER — DEXAMETHASONE SODIUM PHOSPHATE 10 MG/ML IJ SOLN
10.0000 mg | INTRAMUSCULAR | Status: AC
  Administered 2023-10-10: 10 mg via INTRAMUSCULAR

## 2023-10-10 NOTE — ED Provider Notes (Signed)
 [facility]    CSN: [id number] Arrival date & time: 10/10/23  1253      History   Chief Complaint No chief complaint on file.   HPI Jeffery Goodwin is a 73 y.o. male.   The history is provided by the patient.   Patient presents for complaints of swelling to the right hand that is been present for the past several days.  Patient was seen in this clinic on 10/02/2023, as he had swelling in both hands at that time.  He states the swelling in the left hand has since improved, he continues to have swelling in the right hand.  Continues to complain of tenderness, stiffness, and redness.  Patient reports that he had completed some carpentry work before his initial symptoms started.  Denies fever, chills, injury, trauma, numbness, tingling, or bruising.  States that he is scheduled to see Dr. Reeves, hand surgeon with [facility] on tomorrow.  Past Medical History:  Diagnosis Date   Arthritis    Carpal tunnel syndrome    Coronary atherosclerosis of native coronary artery    DES mid LCx/OM2 12/2013   Dry eyes    GERD (gastroesophageal reflux disease)    History of shingles    Hyperlipidemia    Neck pain    Nocturia    Tremor    Right hand    Patient Active Problem List   Diagnosis Date Noted   Internal hemorrhoid 12/18/2022   Intertrochanteric fracture of left femur (HCC) 09/13/2015   Intertrochanteric fracture of left hip (HCC)    Hip fracture (HCC) 09/12/2015   Spinal stenosis in cervical region 12/20/2014   Cervical herniated disc 12/20/2014   History of elevated lipids 05/06/2014   Elevated blood pressure 12/17/2013   Abnormal myocardial perfusion study 12/07/2013   Coronary atherosclerosis of native coronary artery 11/23/2013   TREMOR, ESSENTIAL, RIGHT HAND 06/02/2009    Past Surgical History:  Procedure Laterality Date   ANTERIOR CERVICAL DECOMP/DISCECTOMY FUSION N/A 12/20/2014   Procedure: ANTERIOR CERVICAL DECOMPRESSION/DISCECTOMY FUSION 3 LEVELS;   Surgeon: Victory Gunnels, MD;  Location: [facility];  Service: Neurosurgery;  Laterality: N/A;  ANTERIOR CERVICAL DECOMPRESSION/DISCECTOMY FUSION 3 LEVELS C3-4,4-5,5-6   Arthroscopic left knee surgery  2005/2013   Carpal tunnel repair Right 1998   COLONOSCOPY     x2   COLONOSCOPY WITH PROPOFOL  N/A 09/09/2017   Procedure: COLONOSCOPY WITH PROPOFOL ;  Surgeon: Shaaron Lamar HERO, MD;  Location: [facility];  Service: Endoscopy;  Laterality: N/A;  8:30am   INTRAMEDULLARY (IM) NAIL INTERTROCHANTERIC Left 09/13/2015   Procedure: INTRAMEDULLARY  NAIL INTERTROCHANTRIC;  Surgeon: Taft FORBES Minerva, MD;  Location: [facility];  Service: Orthopedics;  Laterality: Left;   LEFT HEART CATHETERIZATION WITH CORONARY ANGIOGRAM N/A 12/11/2013   Procedure: LEFT HEART CATHETERIZATION WITH CORONARY ANGIOGRAM;  Surgeon: Peter M Jordan, MD;  Location: [facility] CATH LAB;  Service: Cardiovascular;  Laterality: N/A;   Left inguinal herniorrhaphy  2008       Home Medications    Prior to Admission medications   Medication Sig Start Date End Date Taking? Authorizing Provider  aspirin  EC 81 MG tablet Take 1 tablet (81 mg total) by mouth daily. Swallow whole. 03/17/20   Strader, Laymon HERO, PA-C  calcium -vitamin D (OSCAL WITH D) 500-5 MG-MCG tablet Take 1 tablet by mouth.    [provider]  cholecalciferol (VITAMIN D) 1000 units tablet Take 2,000 Units daily by mouth.     [provider]  Evolocumab  (REPATHA  SURECLICK) 140 MG/ML  SOAJ INJECT 140 MG INTO THE SKIN EVERY 14 (FOURTEEN) DAYS. 08/12/23   Debera Jayson MATSU, MD  Flaxseed, Linseed, (FLAX SEEDS) POWD Take by mouth. 08/14/17   [provider]  Omega-3 Fatty Acids (FISH OIL ) 1000 MG CPDR Take 2,000 mg by mouth daily. 09/05/21   Debera Jayson MATSU, MD  Probiotic Product (PROBIOTIC-10 ULTIMATE PO) Take by mouth daily.    [provider]  propranolol  (INDERAL ) 10 MG tablet Take 10 mg by mouth daily.    [provider]  vitamin C  (ASCORBIC  ACID) 500 MG tablet Take 500 mg by mouth daily.    [provider]    Family History Family History  Problem Relation Age of Onset   Colon cancer Mother    Brain cancer Father     Social History Social History   Tobacco Use   Smoking status: Former    Current packs/day: 0.00    Average packs/day: 2.0 packs/day for 37.0 years (74.0 ttl pk-yrs)    Types: Cigarettes    Start date: 09/03/1972    Quit date: 09/03/2008    Years since quitting: 15.1   Smokeless tobacco: Never   Tobacco comments:    quit smoking 6 yrs ago.   Vaping Use   Vaping status: Never Used  Substance Use Topics   Alcohol use: Yes    Alcohol/week: 2.0 standard drinks of alcohol    Types: 2 Cans of beer per week    Comment: 2 beers a day   Drug use: No     Allergies   Rosuvastatin  and Dorethia Carry ]   Review of Systems Review of Systems Per HPI  Physical Exam Triage Vital Signs ED Triage Vitals [10/10/23 1320]  Encounter Vitals Group     BP 139/83     Systolic BP Percentile      Diastolic BP Percentile      Pulse Rate (!) 58     Resp 18     Temp 98.3 F (36.8 C)     Temp Source Oral     SpO2 95 %     Weight      Height      Head Circumference      Peak Flow      Pain Score 7     Pain Loc      Pain Education      Exclude from Growth Chart    No data found.  Updated Vital Signs BP 139/83 (BP Location: Right Arm)   Pulse (!) 58   Temp 98.3 F (36.8 C) (Oral)   Resp 18   SpO2 95%   Visual Acuity Right Eye Distance:   Left Eye Distance:   Bilateral Distance:    Right Eye Near:   Left Eye Near:    Bilateral Near:     Physical Exam Vitals and nursing note reviewed.  Constitutional:      General: He is not in acute distress.    Appearance: Normal appearance.  HENT:     Head: Normocephalic.  Eyes:     Pupils: Pupils are equal, round, and reactive to light.  Musculoskeletal:     Right hand: Swelling and tenderness present. Normal capillary refill. Normal pulse.      Comments: Diffuse right hand edema.  Erythema and tenderness present to the dorsal aspect of the right hand with significant swelling of the right hand and fingers.    Skin:    General: Skin is warm and dry.  Neurological:  General: No focal deficit present.     Mental Status: He is alert and oriented to person, place, and time.  Psychiatric:        Mood and Affect: Mood normal.        Behavior: Behavior normal.      UC Treatments / Results  Labs (all labs ordered are listed, but only abnormal results are displayed) Labs Reviewed  URIC ACID    EKG   Radiology No results found.  Procedures Procedures (including critical care time)  Medications Ordered in UC Medications  dexamethasone  (DECADRON ) injection 10 mg (10 mg Intramuscular Given 10/10/23 1349)    Initial Impression / Assessment and Plan / UC Course  I have reviewed the triage vital signs and the nursing notes.  Pertinent labs & imaging results that were available during my care of the patient were reviewed by me and considered in my medical decision making (see chart for details).  Uric acid level is pending to rule out gout.  Decadron  10 mg IM administered for inflammation and swelling of the right hand.  Supportive care recommendations were provided and discussed with the patient to include the use of ice, and over-the-counter analgesics.  Patient declined use of an Ace wrap today as he states he has several orthopedic devices at home that he can use.  Patient to follow-up with [facility] on tomorrow.  Patient is in agreement with this plan of care and verbalizes understanding.  All questions were answered.  Patient stable for discharge.  Final Clinical Impressions(s) / UC Diagnoses   Final diagnoses:  Localized swelling on right hand     Discharge Instructions      Uric acid level is pending.  You will be contacted if the pending test result is abnormal. You were given an injection of Decadron  10 mg  today.  Do not take any NSAIDs such as Aleve, ibuprofen, Advil, or naproxen today.  Recommend over-the-counter Tylenol  extra strength for breakthrough pain or discomfort. Apply ice to the right hand to help with pain and swelling. Follow-up with Dr. Keane tomorrow as scheduled. Follow-up as needed.     ED Prescriptions   None    PDMP not reviewed this encounter.   Gilmer Etta PARAS, NP 10/10/23 1355

## 2023-10-10 NOTE — ED Triage Notes (Signed)
 Pt reports consistent swelling after being seen last week and treated for sx's pt states the swelling in the left hand as gotten significantly better while swelling in the right is still present.

## 2023-10-10 NOTE — Discharge Instructions (Signed)
 Uric acid level is pending.  You will be contacted if the pending test result is abnormal. You were given an injection of Decadron  10 mg today.  Do not take any NSAIDs such as Aleve, ibuprofen, Advil, or naproxen today.  Recommend over-the-counter Tylenol  extra strength for breakthrough pain or discomfort. Apply ice to the right hand to help with pain and swelling. Follow-up with Dr. Keane tomorrow as scheduled. Follow-up as needed.

## 2023-10-11 DIAGNOSIS — M79641 Pain in right hand: Secondary | ICD-10-CM | POA: Diagnosis not present

## 2023-10-11 DIAGNOSIS — M180 Bilateral primary osteoarthritis of first carpometacarpal joints: Secondary | ICD-10-CM | POA: Diagnosis not present

## 2023-10-11 DIAGNOSIS — M67341 Transient synovitis, right hand: Secondary | ICD-10-CM | POA: Diagnosis not present

## 2023-10-11 DIAGNOSIS — M79642 Pain in left hand: Secondary | ICD-10-CM | POA: Diagnosis not present

## 2023-10-11 LAB — URIC ACID: Uric Acid: 5 mg/dL (ref 3.8–8.4)

## 2023-10-14 ENCOUNTER — Telehealth: Payer: Self-pay | Admitting: Cardiology

## 2023-10-14 NOTE — Telephone Encounter (Signed)
 Pt c/o medication issue:  1. Name of Medication: prednisone 10mg   2. How are you currently taking this medication (dosage and times per day)? New start/Taper   3. Are you having a reaction (difficulty breathing--STAT)? No  4. What is your medication issue? Wants to discuss prescribed med if ok to take and if he can take differently from what was prescrbed

## 2023-10-15 NOTE — Telephone Encounter (Signed)
Patient informed and verbalized understanding of plan.

## 2023-10-17 ENCOUNTER — Telehealth: Payer: Self-pay | Admitting: Cardiology

## 2023-10-17 DIAGNOSIS — R0789 Other chest pain: Secondary | ICD-10-CM

## 2023-10-17 NOTE — Telephone Encounter (Signed)
Jeffery Goodwin says that he has changed his exercise routine because he can no longer use his hands due cartilage damage.  He is now walking briskly for 1 hour up and down a steep incline 10 times. For the last 1 1/2 weeks he is getting some tightness in his chest while exercising.   He referred back to his office visit from 09/23/23 directing him to call us.

## 2023-10-17 NOTE — Telephone Encounter (Signed)
Patient agrees to [employer], instructions given,front desk will schedule  Mr.Jeffery Goodwin will avoid inclines at this point forward.

## 2023-10-17 NOTE — Telephone Encounter (Signed)
   Pt c/o of Chest Pain: STAT if active CP, including tightness, pressure, jaw pain, radiating pain to shoulder/upper arm/back, CP unrelieved by Nitro. Symptoms reported of SOB, nausea, vomiting, sweating.  1. Are you having CP right now? no    2. Are you experiencing any other symptoms (ex. SOB, nausea, vomiting, sweating)? no   3. Is your CP continuous or coming and going? Chest tightness Comes and goes   4. Have you taken Nitroglycerin? No    5. How long have you been experiencing CP? Not sure    6. If NO CP at time of call then end call with telling Pt to call back or call 911 if Chest pain returns prior to return call from triage team.

## 2023-10-23 ENCOUNTER — Telehealth (HOSPITAL_COMMUNITY): Payer: Self-pay | Admitting: Surgery

## 2023-10-23 NOTE — Telephone Encounter (Signed)
 I called and spoke with the pt reminding him of his stress test tomorrow, with his arrival time being 10:00.  I told the pt to check in at the front desk upon arrival in order to get registered.  We also discussed the instructions for the test, which were no eating or drinking 6 hrs prior to the test (no caffeine or chocolate), no smoking 8 hrs prior to the test, wearing comfortable clothes and tennis shoes, and not wearing any lotions/oils.  The pt verbalized understanding of these instructions.

## 2023-10-24 ENCOUNTER — Ambulatory Visit (HOSPITAL_BASED_OUTPATIENT_CLINIC_OR_DEPARTMENT_OTHER)
Admission: RE | Admit: 2023-10-24 | Discharge: 2023-10-24 | Disposition: A | Discharge status: Home / Self Care | Payer: Medicare HMO | Source: Ambulatory Visit | Attending: Cardiology | Admitting: Cardiology

## 2023-10-24 ENCOUNTER — Ambulatory Visit (HOSPITAL_COMMUNITY)
Admission: RE | Admit: 2023-10-24 | Discharge: 2023-10-24 | Disposition: A | Discharge status: Home / Self Care | Payer: Medicare HMO | Source: Ambulatory Visit | Attending: Cardiology | Admitting: Cardiology

## 2023-10-24 DIAGNOSIS — R0789 Other chest pain: Secondary | ICD-10-CM

## 2023-10-24 LAB — NM MYOCAR MULTI W/SPECT W/WALL MOTION / EF
Estimated workload: 1
Exercise duration (min): 0 min
Exercise duration (sec): 0 s
LV dias vol: 173 mL (ref 62–150)
LV sys vol: 81 mL
MPHR: 146 {beats}/min
Nuc Stress EF: 53 %
Peak HR: 68 {beats}/min
Percent HR: 46 %
RATE: 0.2
Rest HR: 44 {beats}/min
Rest Nuclear Isotope Dose: 10.4 mCi
SDS: 6
SRS: 10
SSS: 16
Stress Nuclear Isotope Dose: 31.3 mCi
TID: 1.2

## 2023-10-24 MED ORDER — REGADENOSON 0.4 MG/5ML IV SOLN
INTRAVENOUS | Status: AC
  Administered 2023-10-24: 0.4 mg via INTRAVENOUS
  Filled 2023-10-24: qty 5

## 2023-10-24 MED ORDER — TECHNETIUM TC 99M TETROFOSMIN IV KIT
10.4000 | PACK | Freq: Once | INTRAVENOUS | Status: AC | PRN
  Administered 2023-10-24: 10.4 via INTRAVENOUS

## 2023-10-24 MED ORDER — SODIUM CHLORIDE FLUSH 0.9 % IV SOLN
INTRAVENOUS | Status: AC
  Administered 2023-10-24: 10 mL via INTRAVENOUS
  Filled 2023-10-24: qty 10

## 2023-10-24 MED ORDER — TECHNETIUM TC 99M TETROFOSMIN IV KIT
31.3000 | PACK | Freq: Once | INTRAVENOUS | Status: AC | PRN
  Administered 2023-10-24: 31.3 via INTRAVENOUS

## 2023-10-30 ENCOUNTER — Telehealth: Payer: Self-pay | Admitting: Cardiology

## 2023-10-30 NOTE — Telephone Encounter (Signed)
 Patient informed and verbalized understanding of plan. Copy sent to PCP

## 2023-10-30 NOTE — Telephone Encounter (Signed)
-----   Message from Nona Dell sent at 10/24/2023  1:32 PM EST ----- Results reviewed.  I reviewed the images as well.  There is a perfusion defect in the inferior/inferoseptal distribution which could represent component of infarct scar with peri-infarct ischemia, although no definite wall motion abnormality in this area and his LVEF is preserved.  With exertional angina, would more suspect an ischemic territory and likely some degree of either restenosis in the circumflex distribution which is where his previous stents were placed versus de novo stenosis.  He is on good medical therapy and has good exercise capacity however.  We can continue with current plan and observation as long as symptoms are not increasing in frequency or duration.  Our next step would be a heart catheterization to clarify coronary anatomy and ensure no need for revascularization.

## 2023-10-30 NOTE — Telephone Encounter (Signed)
 Pt would like a c/b regarding Stress Test results. Please advise

## 2023-11-18 DIAGNOSIS — M79642 Pain in left hand: Secondary | ICD-10-CM | POA: Diagnosis not present

## 2023-11-18 DIAGNOSIS — M79641 Pain in right hand: Secondary | ICD-10-CM | POA: Diagnosis not present

## 2023-11-22 DIAGNOSIS — M19041 Primary osteoarthritis, right hand: Secondary | ICD-10-CM | POA: Diagnosis not present

## 2023-11-22 DIAGNOSIS — M19042 Primary osteoarthritis, left hand: Secondary | ICD-10-CM | POA: Diagnosis not present

## 2023-12-19 DIAGNOSIS — Z1283 Encounter for screening for malignant neoplasm of skin: Secondary | ICD-10-CM | POA: Diagnosis not present

## 2023-12-19 DIAGNOSIS — L821 Other seborrheic keratosis: Secondary | ICD-10-CM | POA: Diagnosis not present

## 2023-12-19 DIAGNOSIS — D225 Melanocytic nevi of trunk: Secondary | ICD-10-CM | POA: Diagnosis not present

## 2024-01-16 DIAGNOSIS — Z96652 Presence of left artificial knee joint: Secondary | ICD-10-CM | POA: Diagnosis not present

## 2024-01-16 DIAGNOSIS — M25551 Pain in right hip: Secondary | ICD-10-CM | POA: Diagnosis not present

## 2024-03-18 ENCOUNTER — Other Ambulatory Visit (HOSPITAL_COMMUNITY)
Admission: RE | Admit: 2024-03-18 | Discharge: 2024-03-18 | Disposition: A | Discharge status: Home / Self Care | Source: Ambulatory Visit | Attending: Cardiology | Admitting: Cardiology

## 2024-03-18 ENCOUNTER — Other Ambulatory Visit: Payer: Self-pay | Admitting: *Deleted

## 2024-03-18 ENCOUNTER — Ambulatory Visit: Payer: Self-pay | Admitting: Cardiology

## 2024-03-18 DIAGNOSIS — E782 Mixed hyperlipidemia: Secondary | ICD-10-CM | POA: Diagnosis not present

## 2024-03-18 DIAGNOSIS — Z79899 Other long term (current) drug therapy: Secondary | ICD-10-CM | POA: Diagnosis not present

## 2024-03-18 LAB — LIPID PANEL
Cholesterol: 140 mg/dL (ref 0–200)
HDL: 77 mg/dL (ref >40)
LDL Cholesterol: 57 mg/dL (ref 0–99)
Total CHOL/HDL Ratio: 1.8 ratio
Triglycerides: 28 mg/dL (ref <150)
VLDL: 6 mg/dL (ref 0–40)

## 2024-03-30 ENCOUNTER — Encounter: Payer: Self-pay | Admitting: Cardiology

## 2024-03-30 ENCOUNTER — Ambulatory Visit: Attending: Cardiology | Admitting: Cardiology

## 2024-03-30 VITALS — BP 136/72 | HR 47 | Ht 70.0 in | Wt 221.2 lb

## 2024-03-30 DIAGNOSIS — E782 Mixed hyperlipidemia: Secondary | ICD-10-CM | POA: Diagnosis not present

## 2024-03-30 DIAGNOSIS — T466X5A Adverse effect of antihyperlipidemic and antiarteriosclerotic drugs, initial encounter: Secondary | ICD-10-CM

## 2024-03-30 DIAGNOSIS — M791 Myalgia, unspecified site: Secondary | ICD-10-CM | POA: Diagnosis not present

## 2024-03-30 DIAGNOSIS — I25119 Atherosclerotic heart disease of native coronary artery with unspecified angina pectoris: Secondary | ICD-10-CM | POA: Diagnosis not present

## 2024-03-30 DIAGNOSIS — T466X5D Adverse effect of antihyperlipidemic and antiarteriosclerotic drugs, subsequent encounter: Secondary | ICD-10-CM

## 2024-03-30 NOTE — Progress Notes (Signed)
 Cardiology Office Note  Date: 03/30/2024   ID: Abbie, Jablon [birth year], MRN [id number]  History of Present Illness: Jeffery Goodwin is a 73 y.o. male last seen in January.  He is here for a routine visit.  Reports no change in pattern or intensity of stable exertional angina.  He maintains a regular exercise plan with very good functional capacity.  He does spinning in the mornings, weights and other isometric exercises as well as yoga, then walks outdoors, and then does rowing in the afternoons.  We discussed the results of his interval Myoview .  He does have evidence of inferior/inferoseptal scar with mild peri-infarct ischemia which at this point we are managing medically.  We have discussed the possibility of cardiac catheterization as a next step depending on his symptom control.  We went over his medications.  No changes in cardiovascular regimen.  He continues to tolerate Repatha  quite well with good lipid control.  He is also tracking his blood pressure and heart rate at home.  It is not clear that bradycardia is a limitation.  Physical Exam: VS:  BP 136/72 (BP Location: Right Arm, Cuff Size: Normal)   Pulse (!) 47   Ht 5' 10 (1.778 m)   Wt 221 lb 3.2 oz (100.3 kg)   SpO2 98%   BMI 31.74 kg/m , BMI Body mass index is 31.74 kg/m.  Wt Readings from Last 3 Encounters:  03/30/24 221 lb 3.2 oz (100.3 kg)  09/23/23 220 lb 9.6 oz (100.1 kg)  06/25/23 222 lb 9.6 oz (101 kg)    General: Patient appears comfortable at rest. HEENT: Conjunctiva and lids normal. Neck: Supple, no elevated JVP or carotid bruits. Lungs: Clear to auscultation, nonlabored breathing at rest. Cardiac: Regular rate and rhythm, no S3 or significant systolic murmur.  ECG:  An ECG dated 09/23/2023 was personally reviewed today and demonstrated:  Sinus bradycardia with PACs, left anterior fascicular block and increased voltage.  Labwork: No results found for requested labs within last 365 days.      Component Value Date/Time   CHOL 140 03/18/2024 1135   CHOL 125 09/11/2023 0946   TRIG 28 03/18/2024 1135   HDL 77 03/18/2024 1135   HDL 65 09/11/2023 0946   CHOLHDL 1.8 03/18/2024 1135   VLDL 6 03/18/2024 1135   LDLCALC 57 03/18/2024 1135   LDLCALC 49 09/11/2023 0946   LDLCALC 34 03/13/2023 0901   Other Studies Reviewed Today:  Lexiscan  Myoview  10/24/2023:   Findings are consistent with inferior/inferoseptal infarction with very mild peri-infarct ischemia. The study is low risk.   Baseline ST/T changes unchanged with stress   LV perfusion is abnormal. Large moderate intensity inferior/inferoseptal defect with very mild reversibility   Left ventricular function is abnormal. Global function is normal. Nuclear stress EF: 53%. The left ventricular ejection fraction is normal (55-65%). End diastolic cavity size is normal.  Assessment and Plan:  1.  CAD status post DES to the mid circumflex/OM 2 in 2015.  LVEF 50 to 55% by echocardiogram in May 2023.  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  He has had no escalation in stable exertional angina.  Plan is to continue medical therapy, cardiac catheterization would be next step if symptoms worsen.  Continue aspirin  81 mg daily and Repatha  140 mg every 14 days.   2.  Mixed hyperlipidemia with statin intolerance.  Recent follow-up lab work shows LDL 57 and HDL 77.  Continue Repatha   140 mg every 14 days.  3.  Intermittent elevation in blood pressure.  Continue to track at home.  On balance his average systolic is under 130 per review.  Disposition:  Follow up 6 months, sooner if needed.  Signed, Jayson JUDITHANN Sierras, M.D., F.A.C.C.  HeartCare at [facility]

## 2024-03-30 NOTE — Patient Instructions (Signed)
Medication Instructions:  Your physician recommends that you continue on your current medications as directed. Please refer to the Current Medication list given to you today.   Labwork: None today   Testing/Procedures: None today   Follow-Up: 6 months with Dr.McDowell  Any Other Special Instructions Will Be Listed Below (If Applicable).  If you need a refill on your cardiac medications before your next appointment, please call your pharmacy.  

## 2024-05-21 DIAGNOSIS — L820 Inflamed seborrheic keratosis: Secondary | ICD-10-CM | POA: Diagnosis not present

## 2024-05-21 DIAGNOSIS — L603 Nail dystrophy: Secondary | ICD-10-CM | POA: Diagnosis not present

## 2024-05-21 DIAGNOSIS — L821 Other seborrheic keratosis: Secondary | ICD-10-CM | POA: Diagnosis not present

## 2024-08-31 ENCOUNTER — Other Ambulatory Visit: Payer: Self-pay | Admitting: Cardiology

## 2024-09-01 MED ORDER — REPATHA SURECLICK 140 MG/ML ~~LOC~~ SOAJ: 1.0000 | SUBCUTANEOUS | 3 refills | Status: AC

## 2024-09-07 DIAGNOSIS — Z87891 Personal history of nicotine dependence: Secondary | ICD-10-CM

## 2024-09-17 ENCOUNTER — Encounter (INDEPENDENT_AMBULATORY_CARE_PROVIDER_SITE_OTHER): Payer: Self-pay | Admitting: *Deleted

## 2024-09-25 ENCOUNTER — Ambulatory Visit (HOSPITAL_COMMUNITY): Admission: RE | Admit: 2024-09-25 | Discharge: 2024-09-25 | Disposition: A | Source: Ambulatory Visit

## 2024-09-25 DIAGNOSIS — Z87891 Personal history of nicotine dependence: Secondary | ICD-10-CM | POA: Insufficient documentation

## 2024-10-01 ENCOUNTER — Telehealth: Payer: Self-pay | Admitting: Cardiology

## 2024-10-01 DIAGNOSIS — I25119 Atherosclerotic heart disease of native coronary artery with unspecified angina pectoris: Secondary | ICD-10-CM

## 2024-10-01 DIAGNOSIS — E782 Mixed hyperlipidemia: Secondary | ICD-10-CM

## 2024-10-01 NOTE — Telephone Encounter (Signed)
 Patient wants to get lab orders to be done prior to his visit on 2/4 and stated he can pick up a hard copy if needed.

## 2024-10-01 NOTE — Telephone Encounter (Signed)
 Patient notified and verbalized understanding.

## 2024-10-01 NOTE — Telephone Encounter (Signed)
 Per review of last office note, no lab orders were mentioned/placed. Please advise if this pt needs lab work completed prior to his upcoming office visit.

## 2024-10-02 ENCOUNTER — Other Ambulatory Visit (HOSPITAL_COMMUNITY)
Admission: RE | Admit: 2024-10-02 | Discharge: 2024-10-02 | Disposition: A | Source: Ambulatory Visit | Attending: Cardiology | Admitting: Cardiology

## 2024-10-02 ENCOUNTER — Ambulatory Visit: Payer: Self-pay | Admitting: Cardiology

## 2024-10-02 DIAGNOSIS — E782 Mixed hyperlipidemia: Secondary | ICD-10-CM | POA: Insufficient documentation

## 2024-10-02 DIAGNOSIS — I25119 Atherosclerotic heart disease of native coronary artery with unspecified angina pectoris: Secondary | ICD-10-CM | POA: Diagnosis present

## 2024-10-02 LAB — LIPID PANEL
Cholesterol: 128 mg/dL (ref 0–200)
HDL: 71 mg/dL
LDL Cholesterol: 48 mg/dL (ref 0–99)
Total CHOL/HDL Ratio: 1.8 ratio
Triglycerides: 44 mg/dL
VLDL: 9 mg/dL (ref 0–40)

## 2024-10-07 ENCOUNTER — Ambulatory Visit: Admitting: Cardiology

## 2024-10-07 ENCOUNTER — Encounter: Payer: Self-pay | Admitting: Cardiology

## 2024-10-07 VITALS — BP 120/84 | HR 60 | Ht 70.0 in | Wt 224.4 lb

## 2024-10-07 DIAGNOSIS — I25119 Atherosclerotic heart disease of native coronary artery with unspecified angina pectoris: Secondary | ICD-10-CM

## 2024-10-07 DIAGNOSIS — E782 Mixed hyperlipidemia: Secondary | ICD-10-CM | POA: Diagnosis not present

## 2024-10-07 DIAGNOSIS — T466X5A Adverse effect of antihyperlipidemic and antiarteriosclerotic drugs, initial encounter: Secondary | ICD-10-CM

## 2024-10-07 DIAGNOSIS — T466X5D Adverse effect of antihyperlipidemic and antiarteriosclerotic drugs, subsequent encounter: Secondary | ICD-10-CM

## 2024-10-07 DIAGNOSIS — M791 Myalgia, unspecified site: Secondary | ICD-10-CM | POA: Diagnosis not present

## 2024-10-07 NOTE — Progress Notes (Signed)
"  ° ° °  Cardiology Office Note  Date: 10/07/2024   ID: Duran, Ohern 26-Sep-1950, MRN [id number]  History of Present Illness: Jeffery Goodwin is a 74 y.o. male last seen in July 2025.  He is here for a routine visit.  Continues to exercise an hour and a half daily, no progressive functional limitations.  No exertional chest pain or unusual shortness of breath.  He sleeps with his head elevated in a recliner, has not had any recurrent nocturnal symptoms, possibly reflux per discussion today.  We went over his medications.  He continues to do well on current regimen, follow-up lipids noted below.  He is also tracking his blood pressure regularly at home.  I reviewed his ECG today which shows sinus arrhythmia with left anterior fascicular block and increased voltage.  Physical Exam: VS:  BP 120/84   Pulse 60   Ht 5' 10 (1.778 m)   Wt 224 lb 6.4 oz (101.8 kg)   SpO2 99%   BMI 32.20 kg/m , BMI Body mass index is 32.2 kg/m.  Wt Readings from Last 3 Encounters:  10/07/24 224 lb 6.4 oz (101.8 kg)  03/30/24 221 lb 3.2 oz (100.3 kg)  09/23/23 220 lb 9.6 oz (100.1 kg)    General: Patient appears comfortable at rest. HEENT: Conjunctiva and lids normal. Neck: Supple, no elevated JVP or carotid bruits. Lungs: Clear to auscultation, nonlabored breathing at rest. Cardiac: Regular rate and rhythm, no S3 or significant systolic murmur.  ECG:  An ECG dated 09/23/2023 was personally reviewed today and demonstrated:  Sinus bradycardia with PACs, left anterior fascicular block and increased voltage.  Labwork:  January 2026: Cholesterol 128, triglycerides 44, HDL 71, LDL 48  Other Studies Reviewed Today:  No interval cardiac testing for review today.  Assessment and Plan:  1.  CAD status post DES to the mid circumflex/OM 2 in 2015.  LVEF 50 to 55% by echocardiogram in May 2023.  Follow-up Lexiscan  Myoview  in February 2025 demonstrated inferior/inferoseptal infarct scar with mild peri-infarct  ischemia.  He remains symptomatically stable, no new exertional limitations and continues to exercise regularly as discussed above.  Continue observation on medical therapy, aspirin  81 mg daily and Repatha  140 mg every 14 days.   2.  Mixed hyperlipidemia with statin intolerance.  Follow-up lab work in January shows LDL 48 and HDL 71.  Continue Repatha  140 mg every 14 days.  Disposition:  Follow up 6 months.  Signed, Jayson JUDITHANN Sierras, M.D., F.A.C.C. [facility] "

## 2024-10-07 NOTE — Patient Instructions (Signed)
 Medication Instructions:   Your physician recommends that you continue on your current medications as directed. Please refer to the Current Medication list given to you today.   Labwork: None today  Testing/Procedures: None today  Follow-Up: 6 months Dr.McDowell  Any Other Special Instructions Will Be Listed Below (If Applicable).  If you need a refill on your cardiac medications before your next appointment, please call your pharmacy.
